# Patient Record
Sex: Male | Born: 1985
Health system: Southern US, Community
[De-identification: ages and names within clinical notes are randomized; demographics above are authoritative.]

## PROBLEM LIST (undated history)

## (undated) DIAGNOSIS — Z8781 Personal history of (healed) traumatic fracture: Secondary | ICD-10-CM

## (undated) DIAGNOSIS — I619 Nontraumatic intracerebral hemorrhage, unspecified: Secondary | ICD-10-CM

## (undated) DIAGNOSIS — I10 Essential (primary) hypertension: Secondary | ICD-10-CM

## (undated) HISTORY — DX: Nontraumatic intracerebral hemorrhage, unspecified: I61.9

## (undated) HISTORY — PX: EYE SURGERY: SHX253

## (undated) HISTORY — PX: VENTRICULOPERITONEAL SHUNT: SHX204

## (undated) HISTORY — PX: PEG TUBE PLACEMENT: SUR1034

## (undated) HISTORY — PX: HEMATOMA EVACUATION: SHX5118

---
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
## 2010-05-15 ENCOUNTER — Emergency Department (HOSPITAL_COMMUNITY): Admission: EM | Admit: 2010-05-15 | Discharge: 2010-05-15 | Payer: Self-pay | Admitting: Emergency Medicine
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
## 2011-01-07 NOTE — Op Note (Signed)
NAME:  Paul [name], Paul [name]                         ACCOUNT NO.:  [id number]   MEDICAL RECORD NO.:  [id number]                   PATIENT TYPE:  OIB   LOCATION:  2899                                 FACILITY:  [facility]   PHYSICIAN:  Quita Skye. Hart Rochester, M.D.               DATE OF BIRTH:  1986-02-06   DATE OF PROCEDURE:  11/13/2002  DATE OF DISCHARGE:  11/13/2002                                 OPERATIVE REPORT   PREOPERATIVE DIAGNOSIS:  Rule out renovascular hypertension secondary to  left renal artery stenosis.   CLINICAL HISTORY:  This young patient has been having worsening hypertension  over the past year, difficult to control medically.  A duplex scan done at  [facility] and Vascular revealed a greater than 80% left renal  artery stenosis.  He is now scheduled for abdominal aortogram to rule out  renal artery stenosis as the case for his renovascular hypertension.   DESCRIPTION OF PROCEDURE:  The patient was taken to the [facility] peripheral endovascular lab and placed in the supine  position, at which time the right groin was prepped with Betadine solution  and draped in a routine sterile manner.  After infiltration with 1%  Xylocaine, the right common femoral artery was entered percutaneously.  A  guide wire was passed into the suprarenal aorta under fluoroscopic guidance.  A 5 French sheath and dilator were passed over the dilator.  The dilator was  removed.  A standard pigtail catheter was positioned in the suprarenal  aorta.  Flush abdominal aortogram was performed injected 20 mL of contrast  at 20 mL/sec.  This revealed the aorta to be widely patent with no evidence  of stenosis.  There were single normal-appearing, widely patent renal  arteries bilaterally originating at the same level.  The superior mesenteric  artery filled rapidly, which was the level of the catheter.  The aorta was  normal in appearance, the inferior mesenteric artery was patent,  and both  common iliac arteries also appeared normal.  Both RAO and LAO projections  were performed to rule out ostial stenoses, injecting 20 mL at 20 mL/sec,  and both confirmed the fact that both renal arteries were single and widely  patent.  Having tolerated the procedure well, the catheter was removed over  the guide wire.  The sheath was removed.  Adequate compression applied.  No  complications ensued.   FINDINGS:  1. Normal-appearing infrarenal aorta with widely patent common iliac     arteries.  2. Single widely patent renal arteries bilaterally and widely patent     inferior mesenteric artery.                                               Quita Skye Hart Rochester, M.D.  JDL/MEDQ  D:  11/13/2002  T:  11/13/2002  Job:  [id number]   cc:   Renaye Rakers, M.D.  [phone] [address]  [place]  [place] [address]  Fax: [phone]

## 2011-01-07 NOTE — Op Note (Signed)
[place]. [facility]  Patient:    Paul [name], Paul [name] Visit Number: [id number] MRN: [id number]          Service Type: DSU Location: RCRM 2550 09 Attending Physician:  Ernesto Rutherford Dictated by:   Ernesto Rutherford, M.D. Proc. Date: 02/04/02 Admit Date:  02/04/2002 Discharge Date: 02/04/2002   CC:         Aura Camps, M.D.   Operative Report  PREOPERATIVE DIAGNOSIS: 1. Dense vitreous hemorrhage of the right eye. 2. 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  POSTOPERATIVE DIAGNOSIS: 1. Dense vitreous hemorrhage of the right eye. 2. 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 3. Epiretinal membrane, left eye, found only after removal of the premacular,    vitreous and subinternal limiting membrane hemorrhage.  OPERATION PERFORMED:  Posterior vitrectomy and membrane peel of the right eye with removal of internal limiting membrane.  SURGEON:  Ernesto Rutherford, M.D.  ANESTHESIA:  General endotracheal.  INDICATIONS FOR PROCEDURE:  The patient is a 25 year old male who has suffered bilateral vitreous hemorrhage who required previous vitrectomy and removal of dense vitreous hemorrhage in the left eye with excellent visual acuity restoration from a legally blind status.  His legally blind right eye is now undergoing vitrectomy to remove the vitreous opacifications as well as to uncover and explore the macular findings.  The patient and family understand the risks of anesthesia including the rare occurrence of death but also to the eye including hemorrhage, infection, scarring, need for another surgery, no change in vision, loss of vision, and progressive disease despite intervention.  DESCRIPTION OF PROCEDURE:  After appropriate signed consent was obtained, the patient was taken to the operating room.  In the operating room general endotracheal anesthesia was instituted without difficulty.  The right periocular region was  sterilely prepped and draped in the usual ophthalmic fashion.   lid speculum was applied.  Conjunctival peritomy fashioned temporally and supranasally.  A 4 mm infusion was secured 4 mm posterior to the limbus in the infratemporal quadrant. Placement in the vitreous cavity was verified visually.  Superior sclerotomy was then fashioned.  Wild microscope was placed in position with Biom attached.  Core vitrectomy was then begun. Iatrogenic posterior vitreous detachment was necessary ____________ suction nasal to the optic nerve.  This was carried out without difficulty and the hila was removed off the macular region.  Dense yellow brown old vitreous hemorrhage hovering over the macular region was connected to an internal limiting membrane and subretinal limiting membrane hemorrhage.  ____________ elevated anterior to the equator at 360 degrees and the skirt trimmed.  No holes were identified.  At this time, the edges of the  internal limiting membrane were then grasped.  They had always been previously elevated presumably from the previously loculated premacular hemorrhage.  Removal of the internal limiting membrane was then carried out without difficulty. Preretinal blood was aspirated.  There was no macular hole.  The optic nerve was intact and of normal appearance.  At this time the instruments were removed from the eye.  The sclerotomy was closed with 7-0 Vicryl suture.  The infusion removed and similarly closed with 7-0 Vicryl suture. Conjunctiva closed with 7-0 Vicryl suture.  Subconjunctival injections of antibiotic and steroid were applied.  The patient was awakened from anesthesia and taken to recovery room in good and stable condition. Dictated by:   Ernesto Rutherford, M.D. Attending Physician:  Ernesto Rutherford DD:  02/04/02 TD:  02/05/02 Job: [phone]  [id number]

## 2011-01-07 NOTE — Op Note (Signed)
[place]. [facility]  Patient:    Paul [name], Paul [name] Visit Number: [id number] MRN: [id number]          Service Type: DSU Location: RCRM 2550 16 Attending Physician:  Ernesto Rutherford Dictated by:   Ernesto Rutherford, M.D. Proc. Date: 01/07/02 Admit Date:  01/07/2002 Discharge Date: 01/07/2002                             Operative Report  PREOPERATIVE DIAGNOSES: 1. Dense vitreous hemorrhage of the left eye. 2. Tersons syndrome, left eye.  POSTOPERATIVE DIAGNOSES: 1. Dense vitreous hemorrhage of the left eye. 2. Tersons syndrome, left eye. 3. Old sub internal limiting membrane hemorrhage of the macular region    of the left eye. 4. Possible macular hole or macular etiology for the sub internal limiting    membrane hemorrhage.  OPERATION: 1. Vitrectomy with membrane peel, internal limiting membrane, left eye. 2. Evacuation of preretinal hemorrhage.  SURGEON:  Ernesto Rutherford, M.D.  ANESTHESIA:  General endotracheal anesthesia.  INDICATIONS FOR PROCEDURE:  The patient is a 25 year old man with profound vision loss in both eyes on the basis of vitreous opacification and vitreous hemorrhage in the left eye.  The patient understands this is attempt to remove the vitreous opacification from his recent traumatic raised intracranial pressure etiology.  The patient and family understand this risks of anesthesia including risk to the eye including hemorrhage, infection, scarring, need for further surgery, no change in vision, loss of vision, vision loss from detachment and need for further surgery.   After appropriate signed consent was obtained, the patient was taken to the operating room.  DESCRIPTION OF PROCEDURE:  In the operating room, general endotracheal anesthesia was instituted without difficulty.  The left periocular region was sterilely prepped and draped in the usual fashion.  Lid speculum applied. Conjunctiva peritomy fashioned temporally and  supranasally.  A 4 mm exposure to the limbus in the inferotemporal quadrant.   Placement in vitreous cavity was verified visually.  Superior sclerotomy was then fashioned. Operating microscope was placed in position with BIOM attached. Core vitrectomy was then begun.  Isogenic posterior vitreous detachment was necessary and this was removed without difficulty overlying the macular region.  Dark copper-colored, old blood from the preretinal hemorrhage was found.  No complication noted.  The vitreous was elevated 360 degrees.  This was trimmed.  There were notable findings of internal limiting membrane and sub internal limiting membrane hemorrhage.  This was massively elevated off the macular region.  There was central dehiscence of the internal limiting membrane which allowed for easy grasping of the peripheral edges of the internal limiting membrane approximately 1-1/2 to 2 disk diameter from the center of the fovea and its removal so as to prevent later topographic distortion.  Preretinal hemorrhage was aspirated without difficulty.  There appeared to be a possibility of a macular cyst or a hole, although no full-thickness hole was appreciated, and decision was made not to manipulate the macular hole. At this time, all instruments were removed from the eye. The peripheral retina was inspected and found to be free of retinal holes or tears.  The a;jlkfsd closed with a Vicryl suture.  The infusion jkl;asdf closed with Vicryl suture.  No complications occurred.  Subconjunctival injection with antibiotics applied.  The patient was awakened from anesthesia without difficulty and taken to the recovery room in good and stable condition.  conjunctiva, frontotemporal, and supranasal.  Rectus  muscles isolated on 2-0 silk ties.  At this time, indirect ophthalmoscopy was then performed.  Given the nature of the underlying choroidal coloration, it was very difficult to see the definitive retinal tear  or break that caused this process.  For this reason, retinal cryopexy was applied at the vitreous base 360 degrees using direct observation and visualization with the indirect ophthalmoscope.  At this time, a 287 solid silicone explant was placed under each of the rectus muscles and tied temporarily inferiorly.  Thereafter, with the intraocular pressure somewhat elevated, drainage via 26-gauge needle on a 3 cc Hub syringe was then carried out.  Approximately 1.5 cc of subretinal fluid was drained in this fashion.  This allowed for constant tension to be applied to the rectus sutures to assure that intraocular pressures stayed normalized.  At this time, the inferior fiber Mersilene mattress sutures on the buckle were then tied securely to provide for excellent elevation of the scleral buckle.  At this time, the remaining fiber Mersilene mattress sutures were secured superiorly in the superior quadrants and the buckle appropriate tension applied in supranasal quadrant and secured ______  with fiber Mersilene mattress sutures.  Excellent reconstitution of the globe intraocular pressure was confirmed.  Indirect ophthalmoscopy confirmed that the retina was nicely attached 360 degrees on the buckle.  The fluid of the oblique fovea had been drained.  At this time, the fat of the buckle was irrigated with bug juice.  Conjunctiva brought forward and closed at the limbus with interrupted 7-0 Vicryl sutures.  At this time, subjunctival injections were injected of Rocephin and steroid  4 cc of retrobulbar Sensorcaine was applied for postoperative analgesic control and comfort.  At this time, sterile patch and eye shield applied to the right eye. Intraocular pressure was found to be adequate.  The patient was awakened from anesthesia and taken to the recovery room in good and stable condition.       anesthesia was instituted without difficulty.  The periocular region was sterilely prepped.   Sterile drape was then carried out.  Care was taken to avoid direct pressure to the globe or the orbital region.  ______  applied  gently.  Upon this, it was noted that there was uvea prolapse at the level of the limbus centered on approximately the 7 oclock meridian extending from the 6 oclock to the 8 oclock meridian for a radial site of approximately 5 to 6 mm in length.  Clear vitreous as well as tattered iris and uvula remnants were noted.  The uvea was excised because of disability of reinsertion into the globe.  Mechanical vitrectomy with Weck-cel was then carried out to allow for spontaneous prolapse of the vitreous back through the wound.  Interrupted 8-0 nylon sutures were then used to close the wound.  One single 10-0 nylon suture was used on the portion of the inferior cornea which had a shelved incision near the limbus.  This successfully closed the wound.  The anterior chamber had been deepened with viscoelastic to maintain the globe contour.  Prior to this closure, the conjunctiva in a limited peritomy fashion had been opened from approximately the 10 oclock down to the 5 oclock meridian.  Now that the scleral wound had been closed at the limbus, the lateral rectus muscle was isolated on a 2-0 silk tie and excellent visualization of the posterior sclera in this area was obtained.  There was no other site of scleral rupture.  The remainder of the conjunctiva and  the Tenons was free of hemorrhage and thus unlikely to have posterior ocular hemorrhage rupture.  At this time, the conjunctiva was brought forward and closed with interrupted 7-0 Vicryl suture.  Subconjunctival injections of steroid were applied. Sterile patch was applied to the right eye.  The patient was awakened from anesthesia and taken to the recovery room in good and stable condition having tolerated the procedure well without complications. Dictated by:   Ernesto Rutherford, M.D. Attending Physician:  Ernesto Rutherford DD:  01/07/02 TD:  01/09/02 Job: [id number]

## 2013-06-12 ENCOUNTER — Emergency Department (HOSPITAL_COMMUNITY)
Admission: EM | Admit: 2013-06-12 | Discharge: 2013-06-12 | Disposition: A | Discharge status: Home / Self Care | Payer: No Typology Code available for payment source | Source: Home / Self Care | Attending: Emergency Medicine | Admitting: Emergency Medicine

## 2013-06-12 ENCOUNTER — Encounter (HOSPITAL_COMMUNITY): Payer: Self-pay | Admitting: Emergency Medicine

## 2013-06-12 DIAGNOSIS — N189 Chronic kidney disease, unspecified: Secondary | ICD-10-CM

## 2013-06-12 DIAGNOSIS — I10 Essential (primary) hypertension: Secondary | ICD-10-CM

## 2013-06-12 HISTORY — DX: Essential (primary) hypertension: I10

## 2013-06-12 LAB — POCT I-STAT, CHEM 8
BUN: 16 mg/dL (ref 6–23)
Calcium, Ion: 1.21 mmol/L (ref 1.12–1.23)
Chloride: 106 meq/L (ref 96–112)
Creatinine, Ser: 1.5 mg/dL — ABNORMAL HIGH (ref 0.50–1.35)
Glucose, Bld: 90 mg/dL (ref 70–99)
HCT: 63 % — ABNORMAL HIGH (ref 39.0–52.0)
Hemoglobin: 21.4 g/dL (ref 13.0–17.0)
Potassium: 3.8 meq/L (ref 3.5–5.1)
Sodium: 143 meq/L (ref 135–145)
TCO2: 25 mmol/L (ref 0–100)

## 2013-06-12 MED ORDER — AMLODIPINE BESYLATE 10 MG PO TABS: 10.0000 mg | ORAL_TABLET | Freq: Every day | ORAL | Status: DC

## 2013-06-12 MED ORDER — CLONIDINE HCL 0.1 MG PO TABS
0.1000 mg | ORAL_TABLET | Freq: Once | ORAL | Status: AC
  Administered 2013-06-12: 0.1 mg via ORAL

## 2013-06-12 MED ORDER — CLONIDINE HCL 0.1 MG PO TABS
ORAL_TABLET | ORAL | Status: AC
  Filled 2013-06-12: qty 1

## 2013-06-12 MED ORDER — CLONIDINE HCL 0.1 MG PO TABS
0.2000 mg | ORAL_TABLET | Freq: Once | ORAL | Status: AC
  Administered 2013-06-12: 0.2 mg via ORAL

## 2013-06-12 MED ORDER — FUROSEMIDE 40 MG PO TABS
ORAL_TABLET | ORAL | Status: AC
  Filled 2013-06-12: qty 1

## 2013-06-12 MED ORDER — VALSARTAN-HYDROCHLOROTHIAZIDE 320-25 MG PO TABS: 1.0000 | ORAL_TABLET | Freq: Every day | ORAL | Status: DC

## 2013-06-12 MED ORDER — FUROSEMIDE 40 MG PO TABS
40.0000 mg | ORAL_TABLET | Freq: Once | ORAL | Status: AC
  Administered 2013-06-12: 40 mg via ORAL

## 2013-06-12 MED ORDER — CLONIDINE HCL 0.1 MG PO TABS
ORAL_TABLET | ORAL | Status: AC
  Filled 2013-06-12: qty 2

## 2013-06-12 NOTE — ED Notes (Signed)
Pt c/o HTN... Went for a physical at his job and BP was high Today BP is 211/138 Denies: HA, SOB, CP, blurry vision, weakness Has not had his meds in >1 yr (diovan and Norvasc) He is alert and talking in complete sentences w/no signs of acute distress.

## 2013-06-12 NOTE — ED Provider Notes (Signed)
Chief Complaint:   Chief Complaint  Patient presents with  . Hypertension    History of Present Illness:   Paul [name] is a 27 year old male who has had a history of high blood pressure since he was a teenager. He had been on Diovan and Norvasc, but has been off for about 3 years. He went for a job physical about 2 weeks ago and his blood pressure was elevated at 230/140. He denies any symptoms such as headaches, dizziness, blurry vision, shortness of breath, chest pain, tightness, pressure, syncope, ankle edema, or strokelike symptoms. He does not have any history of diabetes, kidney disease, hyperlipidemia, cigarette smoking, or alcohol abuse.  Review of Systems:  Other than noted above, the patient denies any of the following symptoms: Systemic:  No fever, chills, fatigue, weight loss or gain. Respiratory:  No coughing, wheezing, or shortness of breath. Cardiac:  No chest pain, tightness, pressure, palpitations, syncope, or edema. Neuro:  No headache, dizziness, blurred vision, weakness, paresthesias, or strokelike symptoms.  PMFSH:  Past medical history, family history, social history, meds, and allergies were reviewed.  Physical Exam:   Vital signs:  BP 178/115  Pulse 74  Temp(Src) 98.4 F (36.9 C) (Oral)  Resp 20  SpO2 100% Filed Vitals:   06/12/13 1024 06/12/13 1140 After clonidine 0.2 mg  06/12/13 1231 After additional clonidine 0.1 mg plus furosemide 40 mg   BP: 211/138 189/137 178/115  Pulse: 84 78 74  Temp: 98.4 F (36.9 C)    TempSrc: Oral    Resp: 20    SpO2: 100%     General:  Alert, oriented, in no distress. Lungs:  Breath sounds clear and equal bilaterally.  No wheezes, rales, or rhonchi. Heart:  Regular rhythm, no gallops, murmers, clicks or rubs.  Abdomen:  Soft and flat.  Nontender, no organomegaly or mass.  No pulsatile midline abdominal mass or bruit. Ext:  No edema, pulses full. Neurological exam:  Alert and oriented.  Speech is clear.  No pronator  drift.  CNs intact.  Labs:   Results for orders placed during the hospital encounter of 06/12/13  POCT I-STAT, CHEM 8      Result Value Range   Sodium 143  135 - 145 mEq/L   Potassium 3.8  3.5 - 5.1 mEq/L   Chloride 106  96 - 112 mEq/L   BUN 16  6 - 23 mg/dL   Creatinine, Ser 1.61 (*) 0.50 - 1.35 mg/dL   Glucose, Bld 90  70 - 99 mg/dL   Calcium, Ion 0.96  0.45 - 1.23 mmol/L   TCO2 25  0 - 100 mmol/L   Hemoglobin 21.4 (*) 13.0 - 17.0 g/dL   HCT 40.9 (*) 81.1 - 91.4 %   Comment NOTIFIED PHYSICIAN       Course in Urgent Care Center:   He was given clonidine 0.2 mg by mouth and his blood pressure came down as indicated above somewhat, however it was still very high. He was given additional clonidine 0.1 mg dosage and 40 mg of furosemide and his blood pressure came down a little bit more.  Assessment:  The primary encounter diagnosis was Uncontrolled hypertension. A diagnosis of Chronic kidney disease was also pertinent to this visit.  He'll need followup with his primary care physician within the next week. I also discussed sodium and salt restriction, weight loss, and the D.A.S.H. diet.  Plan:   1.  Meds:  The following meds were prescribed:   Discharge Medication  List as of 06/12/2013 12:33 PM    START taking these medications   Details  amLODipine (NORVASC) 10 MG tablet Take 1 tablet (10 mg total) by mouth daily., Starting 06/12/2013, Until Discontinued, Normal    valsartan-hydrochlorothiazide (DIOVAN HCT) 320-25 MG per tablet Take 1 tablet by mouth daily., Starting 06/12/2013, Until Discontinued, Normal        2.  Patient Education/Counseling:  The patient was given appropriate handouts, self care instructions, and instructed in symptomatic relief. Specifically discussed salt and sodium restriction, weight control, and exercise.  3.  Follow up:  The patient was told to follow up if no better in 3 to 4 days, if becoming worse in any way, and given some red flag symptoms such as  headache, blurry vision, chest pain, shortness of breath, or strokelike symptoms which would prompt immediate return.  Follow up with his primary care physician, Dr. Andi Devon within the next week.     Reuben Likes, MD 06/12/13 1300

## 2014-04-03 ENCOUNTER — Encounter (HOSPITAL_COMMUNITY): Payer: Self-pay | Admitting: Emergency Medicine

## 2014-04-03 DIAGNOSIS — Z982 Presence of cerebrospinal fluid drainage device: Secondary | ICD-10-CM

## 2014-04-03 DIAGNOSIS — G510 Bell's palsy: Principal | ICD-10-CM | POA: Diagnosis present

## 2014-04-03 DIAGNOSIS — E785 Hyperlipidemia, unspecified: Secondary | ICD-10-CM | POA: Diagnosis present

## 2014-04-03 DIAGNOSIS — Z91199 Patient's noncompliance with other medical treatment and regimen due to unspecified reason: Secondary | ICD-10-CM

## 2014-04-03 DIAGNOSIS — I10 Essential (primary) hypertension: Secondary | ICD-10-CM | POA: Diagnosis present

## 2014-04-03 DIAGNOSIS — R29810 Facial weakness: Secondary | ICD-10-CM | POA: Diagnosis not present

## 2014-04-03 DIAGNOSIS — R791 Abnormal coagulation profile: Secondary | ICD-10-CM | POA: Diagnosis present

## 2014-04-03 DIAGNOSIS — Z9119 Patient's noncompliance with other medical treatment and regimen: Secondary | ICD-10-CM

## 2014-04-03 DIAGNOSIS — Z6841 Body Mass Index (BMI) 40.0 and over, adult: Secondary | ICD-10-CM

## 2014-04-03 DIAGNOSIS — E669 Obesity, unspecified: Secondary | ICD-10-CM | POA: Diagnosis present

## 2014-04-03 NOTE — ED Notes (Addendum)
Presents with "feeling off balance when I walk and fatigued" began this AM at 8 AM. Pt is hypertensive 238/148-alert, oriented, left side of face with droop-pt reports that is normal for him from an acccident he was in-denies numbness and tingling. No arm drift, grips equal bilaterally, no slurred speech or confusion. Denies blurred vision and headache/ endorses generalized fatigue

## 2014-04-04 ENCOUNTER — Emergency Department (HOSPITAL_COMMUNITY): Payer: No Typology Code available for payment source

## 2014-04-04 ENCOUNTER — Encounter (HOSPITAL_COMMUNITY): Payer: Self-pay

## 2014-04-04 ENCOUNTER — Inpatient Hospital Stay (HOSPITAL_COMMUNITY)
Admission: EM | Admit: 2014-04-04 | Discharge: 2014-04-06 | DRG: 074 | Disposition: A | Discharge status: Home / Self Care | Payer: No Typology Code available for payment source | Attending: Internal Medicine | Admitting: Internal Medicine

## 2014-04-04 DIAGNOSIS — I10 Essential (primary) hypertension: Secondary | ICD-10-CM

## 2014-04-04 DIAGNOSIS — I160 Hypertensive urgency: Secondary | ICD-10-CM

## 2014-04-04 DIAGNOSIS — R42 Dizziness and giddiness: Secondary | ICD-10-CM

## 2014-04-04 DIAGNOSIS — G510 Bell's palsy: Secondary | ICD-10-CM | POA: Diagnosis present

## 2014-04-04 DIAGNOSIS — I161 Hypertensive emergency: Secondary | ICD-10-CM | POA: Diagnosis present

## 2014-04-04 DIAGNOSIS — R9431 Abnormal electrocardiogram [ECG] [EKG]: Secondary | ICD-10-CM

## 2014-04-04 LAB — CBC
HEMATOCRIT: 51 % (ref 39.0–52.0)
Hemoglobin: 17.4 g/dL — ABNORMAL HIGH (ref 13.0–17.0)
MCH: 26.7 pg (ref 26.0–34.0)
MCHC: 34.1 g/dL (ref 30.0–36.0)
MCV: 78.3 fL (ref 78.0–100.0)
PLATELETS: 268 10*3/uL (ref 150–400)
RBC: 6.51 MIL/uL — ABNORMAL HIGH (ref 4.22–5.81)
RDW: 12.9 % (ref 11.5–15.5)
WBC: 7.2 10*3/uL (ref 4.0–10.5)

## 2014-04-04 LAB — DIFFERENTIAL
BASOS ABS: 0 10*3/uL (ref 0.0–0.1)
BASOS PCT: 0 % (ref 0–1)
EOS PCT: 1 % (ref 0–5)
Eosinophils Absolute: 0.1 10*3/uL (ref 0.0–0.7)
LYMPHS PCT: 29 % (ref 12–46)
Lymphs Abs: 2.1 10*3/uL (ref 0.7–4.0)
MONO ABS: 0.5 10*3/uL (ref 0.1–1.0)
Monocytes Relative: 7 % (ref 3–12)
Neutro Abs: 4.5 10*3/uL (ref 1.7–7.7)
Neutrophils Relative %: 63 % (ref 43–77)

## 2014-04-04 LAB — ETHANOL

## 2014-04-04 LAB — COMPREHENSIVE METABOLIC PANEL
ALT: 22 U/L (ref 0–53)
AST: 17 U/L (ref 0–37)
Albumin: 4 g/dL (ref 3.5–5.2)
Alkaline Phosphatase: 74 U/L (ref 39–117)
Anion gap: 15 (ref 5–15)
BUN: 11 mg/dL (ref 6–23)
CALCIUM: 9.4 mg/dL (ref 8.4–10.5)
CO2: 24 meq/L (ref 19–32)
Chloride: 102 mEq/L (ref 96–112)
Creatinine, Ser: 0.95 mg/dL (ref 0.50–1.35)
GFR calc Af Amer: >90 mL/min (ref >90)
Glucose, Bld: 85 mg/dL (ref 70–99)
Potassium: 4 mEq/L (ref 3.7–5.3)
SODIUM: 141 meq/L (ref 137–147)
Total Bilirubin: 0.4 mg/dL (ref 0.3–1.2)
Total Protein: 7.5 g/dL (ref 6.0–8.3)

## 2014-04-04 LAB — RAPID URINE DRUG SCREEN, HOSP PERFORMED
Amphetamines: NOT DETECTED
BARBITURATES: NOT DETECTED
BENZODIAZEPINES: NOT DETECTED
Cocaine: NOT DETECTED
Opiates: NOT DETECTED
Tetrahydrocannabinol: NOT DETECTED

## 2014-04-04 LAB — URINALYSIS, ROUTINE W REFLEX MICROSCOPIC
Bilirubin Urine: NEGATIVE
GLUCOSE, UA: NEGATIVE mg/dL
Hgb urine dipstick: NEGATIVE
KETONES UR: NEGATIVE mg/dL
LEUKOCYTES UA: NEGATIVE
NITRITE: NEGATIVE
PH: 6 (ref 5.0–8.0)
Protein, ur: NEGATIVE mg/dL
Specific Gravity, Urine: 1.016 (ref 1.005–1.030)
Urobilinogen, UA: 0.2 mg/dL (ref 0.0–1.0)

## 2014-04-04 LAB — MRSA PCR SCREENING: MRSA by PCR: NEGATIVE

## 2014-04-04 LAB — PROTIME-INR
INR: 2.22 — AB (ref 0.00–1.49)
Prothrombin Time: 24.6 seconds — ABNORMAL HIGH (ref 11.6–15.2)

## 2014-04-04 LAB — GLUCOSE, CAPILLARY
GLUCOSE-CAPILLARY: 156 mg/dL — AB (ref 70–99)
GLUCOSE-CAPILLARY: 148 mg/dL — AB (ref 70–99)

## 2014-04-04 LAB — I-STAT TROPONIN, ED: TROPONIN I, POC: 0 ng/mL (ref 0.00–0.08)

## 2014-04-04 LAB — HEMOGLOBIN A1C
Hgb A1c MFr Bld: 5.5 % (ref <5.7)
Mean Plasma Glucose: 111 mg/dL (ref <117)

## 2014-04-04 LAB — APTT: aPTT: 32 seconds (ref 24–37)

## 2014-04-04 IMAGING — CT CT HEAD W/O CM
1 series · 15 of 30 positions shown, 19 images · non-contrast
Comparison: None.

CLINICAL DATA: Left facial droop.  Severe hypertension.

EXAM:
CT HEAD WITHOUT CONTRAST
TECHNIQUE: Contiguous axial images were obtained from the base of the skull
through the vertex without intravenous contrast.

[Series 2: head 5.0 h30s · axial · 0.48mm/px · z∈[-222,-87]mm · 15 of 31 slices shown, 19 images]
[im 2/31  brain]
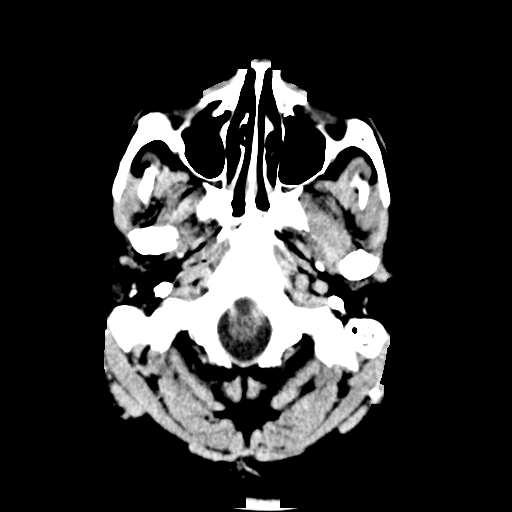
[im 2/31  bone]
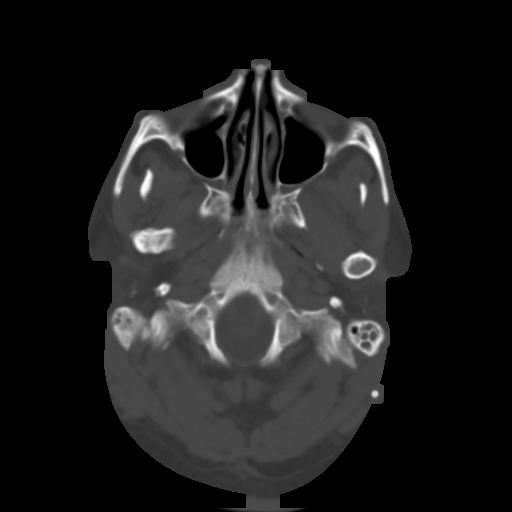
[im 4/31  brain]
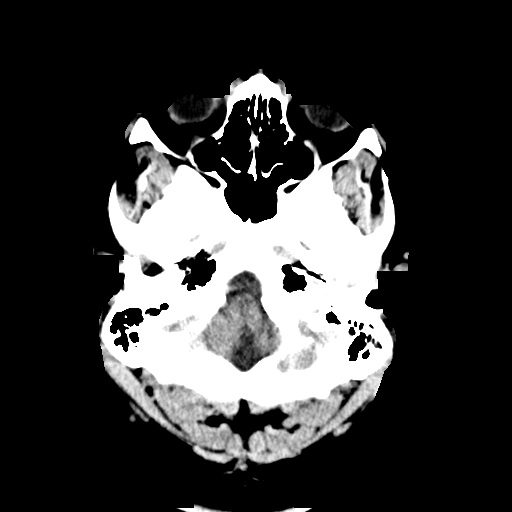
[im 6/31  brain]
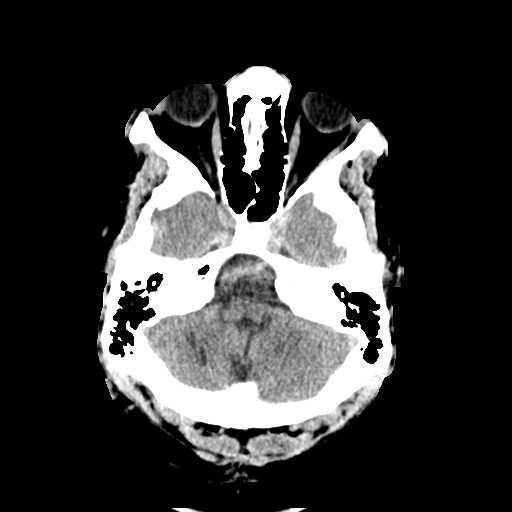
[im 8/31  brain]
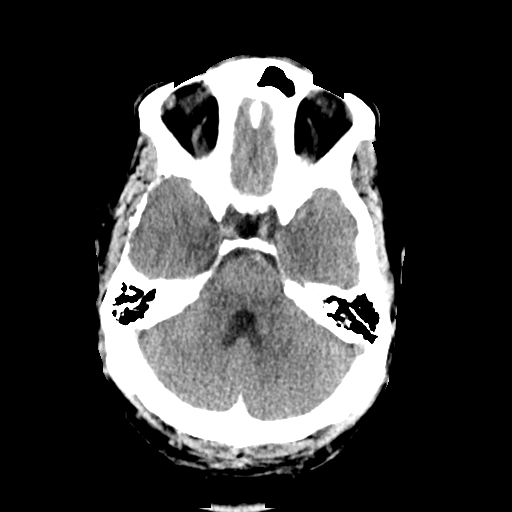
[im 10/31  brain]
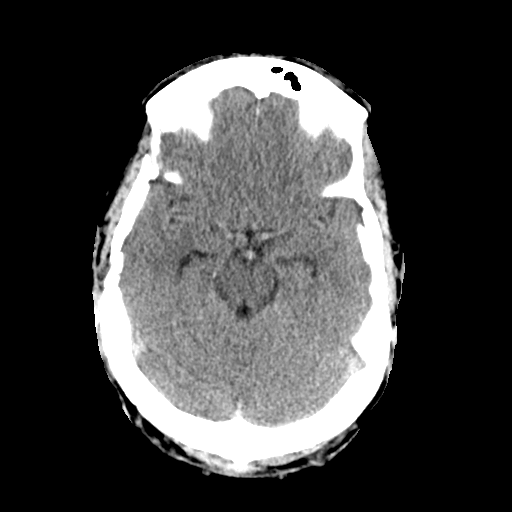
[im 10/31  bone]
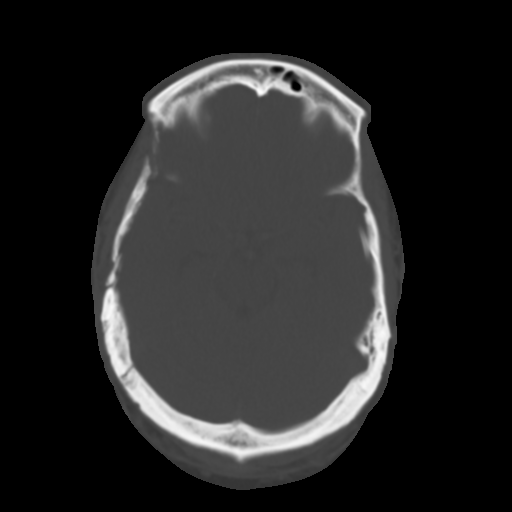
[im 12/31  brain]
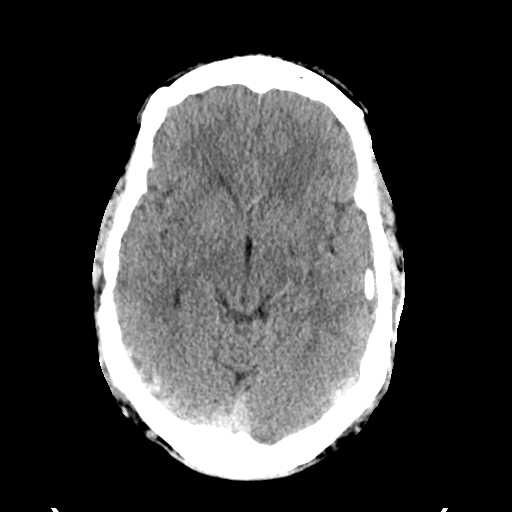
[im 14/31  brain]
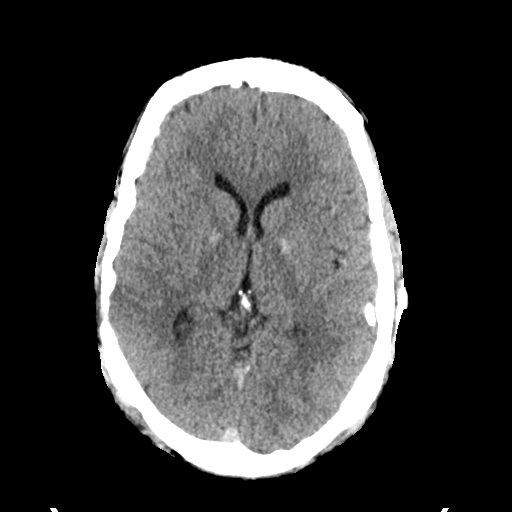
[im 16/31  brain]
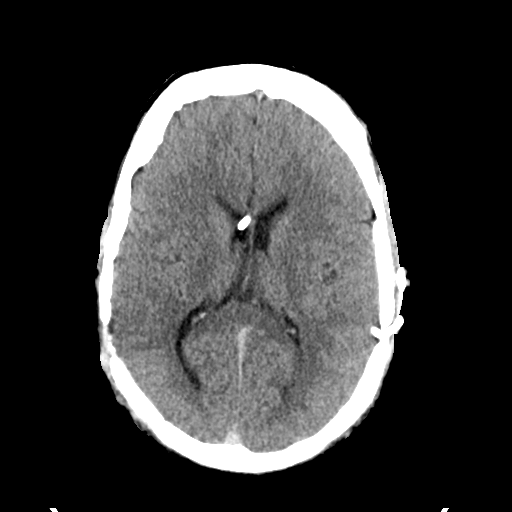
[im 17/31  brain]
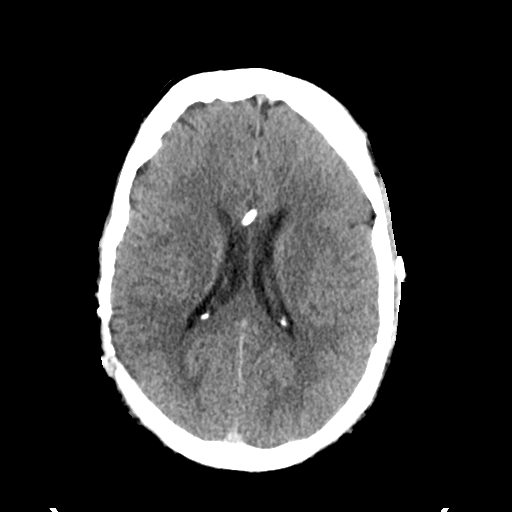
[im 17/31  bone]
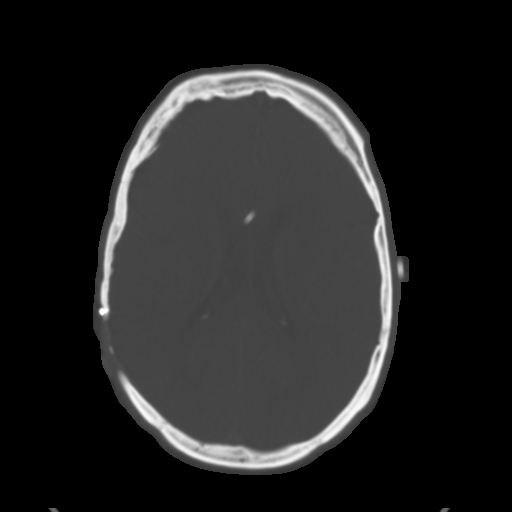
[im 19/31  brain]
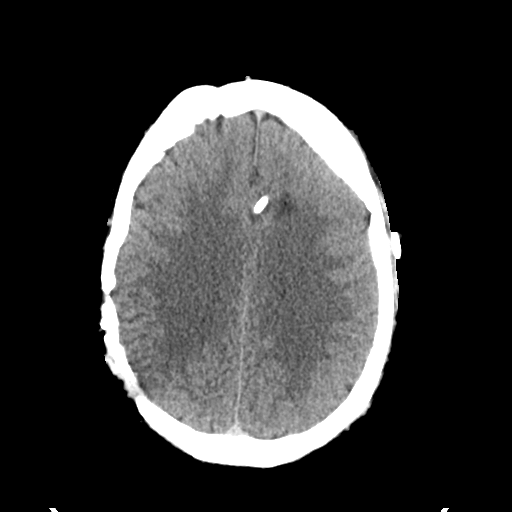
[im 21/31  brain]
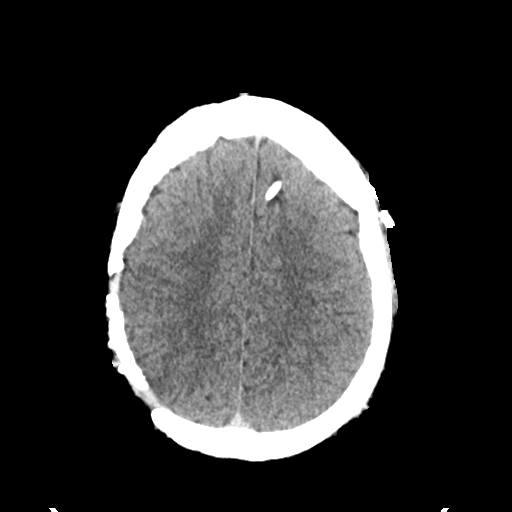
[im 23/31  brain]
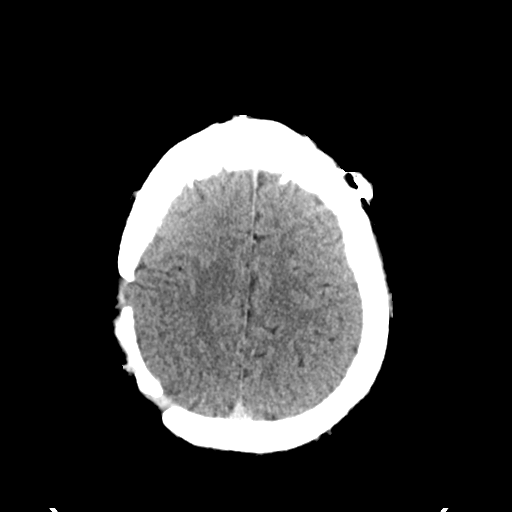
[im 25/31  brain]
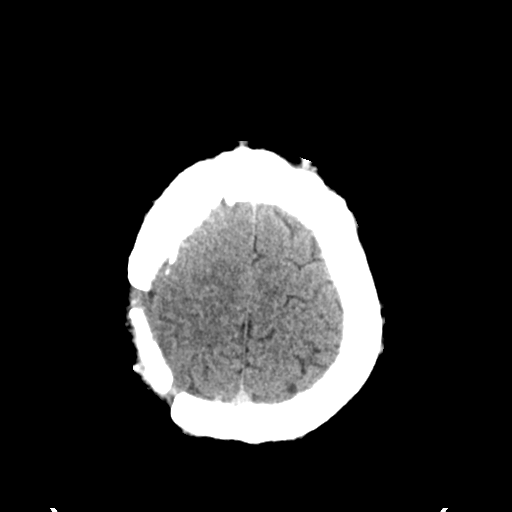
[im 25/31  bone]
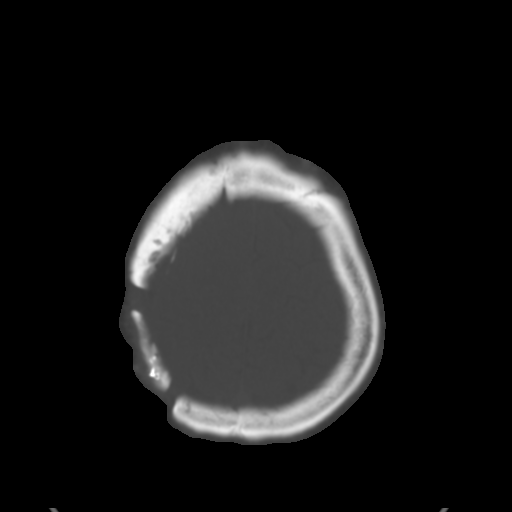
[im 27/31  brain]
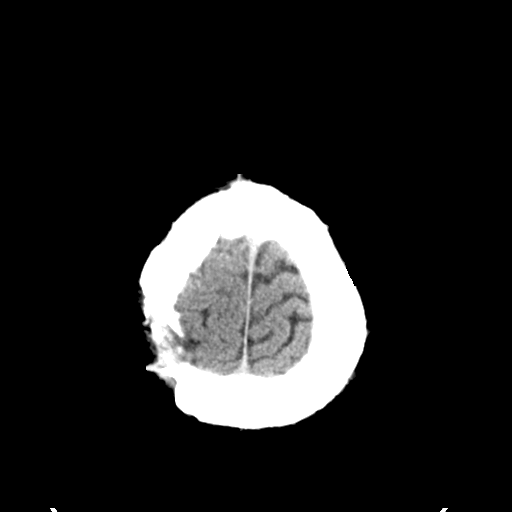
[im 29/31  brain]
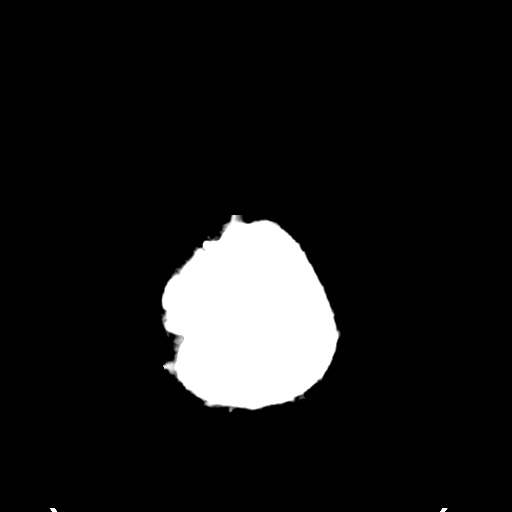

[15 of 30 positions shown; findings below may reference images not displayed]

FINDINGS: There is no evidence of acute infarction, mass lesion, or intra- or
extra-axial hemorrhage on CT.

A left frontal ventricular shunt is noted ending at the frontal horn
of the right lateral ventricle. It extends inferiorly along the left
side of the head to the inferior aspect of the left temporal lobe,
along the base of the skull. There is no evidence of hydrocephalus
at this time.

Mild postoperative change is noted at the right parietal lobe, with
an overlying craniotomy flap and defect. Calcification is seen at
the basal ganglia bilaterally.

The posterior fossa, including the cerebellum, brainstem and fourth
ventricle, is within normal limits. The cerebral hemispheres
demonstrate grossly normal gray-white differentiation. No mass
effect or midline shift is seen.

There is no evidence of fracture; there is apparent chronic anterior
dislocation at the right temporomandibular joint, with associated
chronic deformity of the right mandibular condyle. The orbits are
within normal limits. The paranasal sinuses and mastoid air cells
are well-aerated. No significant soft tissue abnormalities are seen.
IMPRESSION: 1. No acute intracranial pathology seen on CT.
2. Ventricular shunt appears grossly intact. No evidence of
hydrocephalus at this time.
3. Mild postoperative change at the right parietal lobe.
4. Apparent chronic anterior dislocation at the right
temporomandibular joint, with associated chronic deformity of the
right mandibular condyle.

## 2014-04-04 MED ORDER — LABETALOL HCL 5 MG/ML IV SOLN
10.0000 mg | INTRAVENOUS | Status: DC | PRN
  Filled 2014-04-04: qty 4

## 2014-04-04 MED ORDER — METOPROLOL TARTRATE 25 MG PO TABS
25.0000 mg | ORAL_TABLET | Freq: Once | ORAL | Status: AC
  Administered 2014-04-04: 25 mg via ORAL
  Filled 2014-04-04: qty 1

## 2014-04-04 MED ORDER — LISINOPRIL 20 MG PO TABS
20.0000 mg | ORAL_TABLET | Freq: Every day | ORAL | Status: DC
  Administered 2014-04-04 – 2014-04-05 (×2): 20 mg via ORAL
  Filled 2014-04-04 (×2): qty 1

## 2014-04-04 MED ORDER — ENOXAPARIN SODIUM 40 MG/0.4ML ~~LOC~~ SOLN
40.0000 mg | SUBCUTANEOUS | Status: DC
  Administered 2014-04-05: 40 mg via SUBCUTANEOUS
  Filled 2014-04-04 (×3): qty 0.4

## 2014-04-04 MED ORDER — LABETALOL HCL 5 MG/ML IV SOLN
20.0000 mg | Freq: Once | INTRAVENOUS | Status: AC
  Administered 2014-04-04: 20 mg via INTRAVENOUS
  Filled 2014-04-04: qty 4

## 2014-04-04 MED ORDER — SODIUM CHLORIDE 0.9 % IV SOLN: INTRAVENOUS | Status: DC

## 2014-04-04 MED ORDER — IRBESARTAN 150 MG PO TABS
150.0000 mg | ORAL_TABLET | Freq: Every day | ORAL | Status: DC
  Administered 2014-04-04: 150 mg via ORAL
  Filled 2014-04-04: qty 1

## 2014-04-04 MED ORDER — AMLODIPINE BESYLATE 5 MG PO TABS
5.0000 mg | ORAL_TABLET | Freq: Every day | ORAL | Status: DC
  Administered 2014-04-04: 5 mg via ORAL
  Filled 2014-04-04: qty 1

## 2014-04-04 MED ORDER — HYDRALAZINE HCL 20 MG/ML IJ SOLN
INTRAMUSCULAR | Status: AC
  Administered 2014-04-04: 10 mg via INTRAVENOUS
  Filled 2014-04-04: qty 1

## 2014-04-04 MED ORDER — VALACYCLOVIR HCL 500 MG PO TABS
1000.0000 mg | ORAL_TABLET | Freq: Three times a day (TID) | ORAL | Status: DC
  Administered 2014-04-04 – 2014-04-06 (×6): 1000 mg via ORAL
  Filled 2014-04-04 (×8): qty 2

## 2014-04-04 MED ORDER — VALACYCLOVIR HCL 500 MG PO TABS
1000.0000 mg | ORAL_TABLET | Freq: Three times a day (TID) | ORAL | Status: DC
  Administered 2014-04-04: 1000 mg via ORAL
  Filled 2014-04-04 (×4): qty 2

## 2014-04-04 MED ORDER — LABETALOL HCL 5 MG/ML IV SOLN
80.0000 mg | INTRAVENOUS | Status: DC | PRN
  Administered 2014-04-04: 80 mg via INTRAVENOUS
  Filled 2014-04-04: qty 16

## 2014-04-04 MED ORDER — ACETAMINOPHEN 325 MG PO TABS: 650.0000 mg | ORAL_TABLET | ORAL | Status: DC | PRN

## 2014-04-04 MED ORDER — HYDRALAZINE HCL 20 MG/ML IJ SOLN
10.0000 mg | Freq: Four times a day (QID) | INTRAMUSCULAR | Status: DC | PRN
  Administered 2014-04-04: 10 mg via INTRAVENOUS
  Administered 2014-04-05: 16:00:00 via INTRAVENOUS
  Filled 2014-04-04: qty 1

## 2014-04-04 MED ORDER — METHYLPREDNISOLONE SODIUM SUCC 125 MG IJ SOLR
125.0000 mg | Freq: Once | INTRAMUSCULAR | Status: AC
  Administered 2014-04-04: 125 mg via INTRAVENOUS
  Filled 2014-04-04: qty 2

## 2014-04-04 MED ORDER — PREDNISONE 50 MG PO TABS
60.0000 mg | ORAL_TABLET | Freq: Every day | ORAL | Status: DC
  Administered 2014-04-05 – 2014-04-06 (×2): 60 mg via ORAL
  Filled 2014-04-04 (×3): qty 1

## 2014-04-04 MED ORDER — HYDRALAZINE HCL 25 MG PO TABS
25.0000 mg | ORAL_TABLET | Freq: Three times a day (TID) | ORAL | Status: DC
  Administered 2014-04-04 – 2014-04-05 (×3): 25 mg via ORAL
  Filled 2014-04-04 (×6): qty 1

## 2014-04-04 MED ORDER — METOPROLOL TARTRATE 50 MG PO TABS
50.0000 mg | ORAL_TABLET | Freq: Two times a day (BID) | ORAL | Status: DC
  Administered 2014-04-04 – 2014-04-06 (×5): 50 mg via ORAL
  Filled 2014-04-04: qty 1
  Filled 2014-04-04: qty 2
  Filled 2014-04-04 (×5): qty 1

## 2014-04-04 MED ORDER — ALPRAZOLAM 0.25 MG PO TABS: 0.2500 mg | ORAL_TABLET | Freq: Three times a day (TID) | ORAL | Status: DC | PRN

## 2014-04-04 MED ORDER — LABETALOL HCL 5 MG/ML IV SOLN
40.0000 mg | INTRAVENOUS | Status: DC | PRN
  Administered 2014-04-04: 40 mg via INTRAVENOUS
  Filled 2014-04-04: qty 8

## 2014-04-04 NOTE — ED Notes (Signed)
Pt requesting BP medications and discharge. Will ask EDP to consult with patient.

## 2014-04-04 NOTE — Progress Notes (Signed)
Utilization Review Completed.Paul [name] T8/14/2015  

## 2014-04-04 NOTE — Progress Notes (Addendum)
Patient admitted by Dr. K, please seeKirtland [name] H&P.  Patient is refusing MRI- educated and possibility of stroke.  Patient has had high BP for years- has not been taking home BP meds.  Will try to start meds that are on the $4 [employer] list.   Await repeat INR -if BP not improved, may need gtt Marlin CanaryJessica Seraiah Nowack DO
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
## 2014-04-05 DIAGNOSIS — Z982 Presence of cerebrospinal fluid drainage device: Secondary | ICD-10-CM | POA: Diagnosis not present

## 2014-04-05 DIAGNOSIS — E785 Hyperlipidemia, unspecified: Secondary | ICD-10-CM | POA: Diagnosis present

## 2014-04-05 DIAGNOSIS — G510 Bell's palsy: Secondary | ICD-10-CM | POA: Diagnosis present

## 2014-04-05 DIAGNOSIS — I10 Essential (primary) hypertension: Secondary | ICD-10-CM | POA: Diagnosis present

## 2014-04-05 DIAGNOSIS — R791 Abnormal coagulation profile: Secondary | ICD-10-CM | POA: Diagnosis present

## 2014-04-05 DIAGNOSIS — Z9119 Patient's noncompliance with other medical treatment and regimen: Secondary | ICD-10-CM | POA: Diagnosis not present

## 2014-04-05 DIAGNOSIS — E669 Obesity, unspecified: Secondary | ICD-10-CM | POA: Diagnosis present

## 2014-04-05 DIAGNOSIS — R29810 Facial weakness: Secondary | ICD-10-CM | POA: Diagnosis present

## 2014-04-05 DIAGNOSIS — Z91199 Patient's noncompliance with other medical treatment and regimen due to unspecified reason: Secondary | ICD-10-CM | POA: Diagnosis not present

## 2014-04-05 DIAGNOSIS — Z6841 Body Mass Index (BMI) 40.0 and over, adult: Secondary | ICD-10-CM | POA: Diagnosis not present

## 2014-04-05 LAB — HIV ANTIBODY (ROUTINE TESTING W REFLEX): HIV 1&2 Ab, 4th Generation: NONREACTIVE

## 2014-04-05 LAB — GLUCOSE, CAPILLARY
GLUCOSE-CAPILLARY: 105 mg/dL — AB (ref 70–99)
Glucose-Capillary: 110 mg/dL — ABNORMAL HIGH (ref 70–99)
Glucose-Capillary: 108 mg/dL — ABNORMAL HIGH (ref 70–99)
Glucose-Capillary: 125 mg/dL — ABNORMAL HIGH (ref 70–99)

## 2014-04-05 LAB — LIPID PANEL
Cholesterol: 191 mg/dL (ref 0–200)
HDL: 47 mg/dL (ref >39)
LDL Cholesterol: 132 mg/dL — ABNORMAL HIGH (ref 0–99)
Total CHOL/HDL Ratio: 4.1 RATIO
Triglycerides: 58 mg/dL (ref <150)
VLDL: 12 mg/dL (ref 0–40)

## 2014-04-05 LAB — PROTIME-INR
INR: 1.06 (ref 0.00–1.49)
PROTHROMBIN TIME: 13.8 s (ref 11.6–15.2)

## 2014-04-05 MED ORDER — HYDROCHLOROTHIAZIDE 25 MG PO TABS
25.0000 mg | ORAL_TABLET | Freq: Every day | ORAL | Status: DC
  Administered 2014-04-05 – 2014-04-06 (×2): 25 mg via ORAL
  Filled 2014-04-05 (×2): qty 1

## 2014-04-05 MED ORDER — HYDRALAZINE HCL 50 MG PO TABS
50.0000 mg | ORAL_TABLET | Freq: Three times a day (TID) | ORAL | Status: DC
  Administered 2014-04-05 – 2014-04-06 (×3): 50 mg via ORAL
  Filled 2014-04-05 (×6): qty 1

## 2014-04-05 MED ORDER — LISINOPRIL 40 MG PO TABS
40.0000 mg | ORAL_TABLET | Freq: Every day | ORAL | Status: DC
  Administered 2014-04-06: 40 mg via ORAL
  Filled 2014-04-05: qty 1

## 2014-04-05 NOTE — Plan of Care (Signed)
Problem: Phase I Progression Outcomes Goal: OOB as tolerated unless otherwise ordered Outcome: Completed/Met Date Met:  04/05/14 Pt is independent and able to walk around room freely

## 2014-04-05 NOTE — Plan of Care (Signed)
Problem: Phase I Progression Outcomes Goal: Hemodynamically stable Outcome: Progressing Pt blood pressure has been in the 150-160 systolic range and 70-80 in the diastolic range.

## 2014-04-05 NOTE — Progress Notes (Signed)
Utilization Review Completed.Paul [name] T8/15/2015  

## 2014-04-05 NOTE — Discharge Instructions (Signed)
Bell's Palsy °Bell's palsy is a condition in which the muscles on one side of the face cannot move (paralysis). This is because the nerves in the face are paralyzed. It is most often thought to be caused by a virus. The virus causes swelling of the nerve that controls movement on one side of the face. The nerve travels through a tight space surrounded by bone. When the nerve swells, it can be compressed by the bone. This results in damage to the protective covering around the nerve. This damage interferes with how the nerve communicates with the muscles of the face. As a result, it can cause weakness or paralysis of the facial muscles.  °Injury (trauma), tumor, and surgery may cause Bell's palsy, but most of the time the cause is unknown. It is a relatively common condition. It starts suddenly (abrupt onset) with the paralysis usually ending within 2 days. Bell's palsy is not dangerous. But because the eye does not close properly, you may need care to keep the eye from getting dry. This can include splinting (to keep the eye shut) or moistening with artificial tears. Bell's palsy very seldom occurs on both sides of the face at the same time. °SYMPTOMS  °· Eyebrow sagging. °· Drooping of the eyelid and corner of the mouth. °· Inability to close one eye. °· Loss of taste on the front of the tongue. °· Sensitivity to loud noises. °TREATMENT  °The treatment is usually non-surgical. If the patient is seen within the first 24 to 48 hours, a short course of steroids may be prescribed, in an attempt to shorten the length of the condition. Antiviral medicines may also be used with the steroids, but it is unclear if they are helpful.  °You will need to protect your eye, if you cannot close it. The cornea (clear covering over your eye) will become dry and can be damaged. Artificial tears can be used to keep your eye moist. Glasses or an eye patch should be worn to protect your eye. °PROGNOSIS  °Recovery is variable, ranging  from days to months. Although the problem usually goes away completely (about 80% of cases resolve), predicting the outcome is impossible. Most people improve within 3 weeks of when the symptoms began. Improvement may continue for 3 to 6 months. A small number of people have moderate to severe weakness that is permanent.  °HOME CARE INSTRUCTIONS  °· If your caregiver prescribed medication to reduce swelling in the nerve, use as directed. Do not stop taking the medication unless directed by your caregiver. °· Use moisturizing eye drops as needed to prevent drying of your eye, as directed by your caregiver. °· Protect your eye, as directed by your caregiver. °· Use facial massage and exercises, as directed by your caregiver. °· Perform your normal activities, and get your normal rest. °SEEK IMMEDIATE MEDICAL CARE IF:  °· There is pain, redness or irritation in the eye. °· You or your child has an oral temperature above 102° F (38.9° C), not controlled by medicine. °MAKE SURE YOU:  °· Understand these instructions. °· Will watch your condition. °· Will get help right away if you are not doing well or get worse. °Document Released: 08/08/2005 Document Revised: 10/31/2011 Document Reviewed: 11/15/2013 °[employer]® Patient Information ©2015 [employer]. This information is not intended to replace advice given to you by your health care provider. Make sure you discuss any questions you have with your health care provider. ° °

## 2014-04-05 NOTE — Progress Notes (Addendum)
PROGRESS NOTE  Paul [name] NFA:[id number] DOB: 02-Aug-1986 DOA: 04/04/2014 PCP: Alva Garnet., MD  Assessment/Plan: Uncontrolled HTN- has not been taking medications at home- running 190s when he checks, no headache, no chest pain -starting $4 medications from [facility] as costs were preciously a concern - ? If once medications are taken consistently and BP not better, may need RAD and nephrology referral  Bells palsy- given steroids/acyclovir x 1 week Refusing MRI  HLD- diet changes  Dizziness resolved, patient up walking around  Code Status: full Family Communication: patient Disposition Plan:    Consultants:    Procedures:      HPI/Subjective: BP still high No symptoms  Objective: Filed Vitals:   04/05/14 0802  BP: 198/110  Pulse: 100  Temp: 98 F (36.7 C)  Resp: 20    Intake/Output Summary (Last 24 hours) at 04/05/14 1121 Last data filed at 04/05/14 0900  Gross per 24 hour  Intake    600 ml  Output      1 ml  Net    599 ml   Filed Weights   04/04/14 1400  Weight: 154.5 kg (340 lb 9.8 oz)    Exam:   General:  A+OX3, NAD  Cardiovascular: rrr  Respiratory: clear  Abdomen: +BS, soft  Musculoskeletal: moves all 4 ext   Data Reviewed: Basic Metabolic Panel:  Recent Labs Lab 04/04/14 0015  NA 141  K 4.0  CL 102  CO2 24  GLUCOSE 85  BUN 11  CREATININE 0.95  CALCIUM 9.4   Liver Function Tests:  Recent Labs Lab 04/04/14 0015  AST 17  ALT 22  ALKPHOS 74  BILITOT 0.4  PROT 7.5  ALBUMIN 4.0   No results found for this basename: LIPASE, AMYLASE,  in the last 168 hours No results found for this basename: AMMONIA,  in the last 168 hours CBC:  Recent Labs Lab 04/04/14 0015  WBC 7.2  NEUTROABS 4.5  HGB 17.4*  HCT 51.0  MCV 78.3  PLT 268   Cardiac Enzymes: No results found for this basename: CKTOTAL, CKMB, CKMBINDEX, TROPONINI,  in the last 168 hours BNP (last 3 results) No results found for this basename:  PROBNP,  in the last 8760 hours CBG:  Recent Labs Lab 04/04/14 1742 04/04/14 2126 04/05/14 0815  GLUCAP 156* 148* 110*    Recent Results (from the past 240 hour(s))  MRSA PCR SCREENING     Status: None   Collection Time    04/04/14  2:11 PM      Result Value Ref Range Status   MRSA by PCR NEGATIVE  NEGATIVE Final   Comment:            The GeneXpert MRSA Assay (FDA     approved for NASAL specimens     only), is one component of a     comprehensive MRSA colonization     surveillance program. It is not     intended to diagnose MRSA     infection nor to guide or     monitor treatment for     MRSA infections.     Studies: Ct Head Wo Contrast  04/04/2014   CLINICAL DATA:  Left facial droop.  Severe hypertension.  EXAM: CT HEAD WITHOUT CONTRAST  TECHNIQUE: Contiguous axial images were obtained from the base of the skull through the vertex without intravenous contrast.  COMPARISON:  None.  FINDINGS: There is no evidence of acute infarction, mass lesion, or intra- or extra-axial hemorrhage on CT.  A left frontal ventricular shunt is noted ending at the frontal horn of the right lateral ventricle. It extends inferiorly along the left side of the head to the inferior aspect of the left temporal lobe, along the base of the skull. There is no evidence of hydrocephalus at this time.  Mild postoperative change is noted at the right parietal lobe, with an overlying craniotomy flap and defect. Calcification is seen at the basal ganglia bilaterally.  The posterior fossa, including the cerebellum, brainstem and fourth ventricle, is within normal limits. The cerebral hemispheres demonstrate grossly normal gray-white differentiation. No mass effect or midline shift is seen.  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 The orbits are within normal limits. The  paranasal sinuses and mastoid air cells are well-aerated. No significant soft tissue abnormalities are seen.  IMPRESSION: 1. No acute intracranial pathology seen on CT. 2. Ventricular shunt appears grossly intact. No evidence of hydrocephalus at this time. 3. Mild postoperative change at the right parietal lobe. 4. 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   Electronically Signed   By: Roanna RaiderJeffery  Chang M.D.   On: 04/04/2014 02:08    Scheduled Meds: . enoxaparin (LOVENOX) injection  40 mg Subcutaneous Q24H  . hydrALAZINE  50 mg Oral 3 times per day  . hydrochlorothiazide  25 mg Oral Daily  . lisinopril  20 mg Oral Daily  . metoprolol tartrate  50 mg Oral BID  . predniSONE  60 mg Oral Q breakfast  . valACYclovir  1,000 mg Oral TID   Continuous Infusions: . sodium chloride     Antibiotics Given (last 72 hours)   Date/Time Action Medication Dose   04/04/14 1106 Given  [Med not avaliable at scheduled time.]   valACYclovir (VALTREX) tablet 1,000 mg 1,000 mg   04/04/14 1519 Given   valACYclovir (VALTREX) tablet 1,000 mg 1,000 mg   04/04/14 2116 Given   valACYclovir (VALTREX) tablet 1,000 mg 1,000 mg   04/05/14 1026 Given   valACYclovir (VALTREX) tablet 1,000 mg 1,000 mg      Principal Problem:   Hypertensive urgency Active Problems:   Facial palsy   Hypertensive emergency    Time spent: 25 min    Paul [name]  [facility] Pager [phone]. If 7PM-7AM, please contact night-coverage at [email or web address], password [email or web address] 04/05/2014, 11:21 AM  LOS: 1 day

## 2014-04-06 DIAGNOSIS — R42 Dizziness and giddiness: Secondary | ICD-10-CM

## 2014-04-06 LAB — BASIC METABOLIC PANEL
Anion gap: 13 (ref 5–15)
BUN: 17 mg/dL (ref 6–23)
CALCIUM: 9.9 mg/dL (ref 8.4–10.5)
CO2: 25 meq/L (ref 19–32)
Chloride: 99 mEq/L (ref 96–112)
Creatinine, Ser: 1 mg/dL (ref 0.50–1.35)
GFR calc Af Amer: >90 mL/min (ref >90)
GFR calc non Af Amer: >90 mL/min (ref >90)
GLUCOSE: 110 mg/dL — AB (ref 70–99)
Potassium: 4 mEq/L (ref 3.7–5.3)
Sodium: 137 mEq/L (ref 137–147)

## 2014-04-06 LAB — CBC
HEMATOCRIT: 49.6 % (ref 39.0–52.0)
HEMOGLOBIN: 17.1 g/dL — AB (ref 13.0–17.0)
MCH: 27.3 pg (ref 26.0–34.0)
MCHC: 34.5 g/dL (ref 30.0–36.0)
MCV: 79.1 fL (ref 78.0–100.0)
Platelets: 254 10*3/uL (ref 150–400)
RBC: 6.27 MIL/uL — AB (ref 4.22–5.81)
RDW: 13.6 % (ref 11.5–15.5)
WBC: 14 10*3/uL — ABNORMAL HIGH (ref 4.0–10.5)

## 2014-04-06 LAB — PROTIME-INR
INR: 1.04 (ref 0.00–1.49)
Prothrombin Time: 13.6 seconds (ref 11.6–15.2)

## 2014-04-06 LAB — GLUCOSE, CAPILLARY: GLUCOSE-CAPILLARY: 92 mg/dL (ref 70–99)

## 2014-04-06 MED ORDER — HYDRALAZINE HCL 50 MG PO TABS: 50.0000 mg | ORAL_TABLET | Freq: Three times a day (TID) | ORAL | Status: DC

## 2014-04-06 MED ORDER — METOPROLOL TARTRATE 50 MG PO TABS: 50.0000 mg | ORAL_TABLET | Freq: Two times a day (BID) | ORAL | Status: DC

## 2014-04-06 MED ORDER — VALACYCLOVIR HCL 1 G PO TABS: 1000.0000 mg | ORAL_TABLET | Freq: Three times a day (TID) | ORAL | Status: DC

## 2014-04-06 MED ORDER — PREDNISONE 20 MG PO TABS: 60.0000 mg | ORAL_TABLET | Freq: Every day | ORAL | Status: DC

## 2014-04-06 MED ORDER — HYDROCHLOROTHIAZIDE 25 MG PO TABS: 25.0000 mg | ORAL_TABLET | Freq: Every day | ORAL | Status: DC

## 2014-04-06 MED ORDER — LISINOPRIL 40 MG PO TABS: 40.0000 mg | ORAL_TABLET | Freq: Every day | ORAL | Status: DC

## 2014-04-06 NOTE — Discharge Summary (Addendum)
Physician Discharge Summary  Paul [name] ZOX:[id number] DOB: 1986-07-05 DOA: 04/04/2014  PCP: No primary provider on file.  Admit date: 04/04/2014 Discharge date: 04/06/2014  Time spent: 35 minutes  Recommendations for Outpatient Follow-up:  1. Check BP at home and bring to PCP 2. FLP 3 months 3. BMp 1-2 weeks re Cr/K -counseled patient extensively on risks/complications of uncontrolled BP-patient tried to leave AMA several times and requested to be d/c daily- BP still not at optimal control but improved from admission  Discharge Diagnoses:  Principal Problem:   Hypertensive urgency Active Problems:   Facial palsy   Hypertensive emergency   Discharge Condition: improved  Diet recommendation: cardiac  Filed Weights   04/04/14 1400  Weight: 154.5 kg (340 lb 9.8 oz)    History of present illness:  Paul [name] is a 28 y.o. male with history of hypertension, patient has not been taking his medications for last one year, started experiencing difficulty walking with balance issues since yesterday morning with left facial palsy involving the whole of the left face. Patient denies any weakness of the upper or lower extremities or any difficulty swallowing or speaking. Denies any headache fever chills or any trauma. Since the symptoms persisted patient came to the ER late in the night. CT head did not show anything acute. On-call neurologist Dr. Roseanne Reno was consulted by the physician and at this time since patient's left facial palsy is more consistent with possible Bell's palsy Dr. Roseanne Reno has advised to start patient on Valtrex and prednisone and get MRI to rule out stroke. In addition patient was found to have very high blood pressure with systolic blood pressure in the 230s and diastolic more than 110. Patient was given multiple doses of labetalol following which patient's blood pressure has decreased to around 190-180 systolic. Patient states that he usually takes Diovan and Norvasc  which he has not been taking for last one year due to financial issues. Patient otherwise denies any chest pain shortness of breath nausea vomiting abdominal pain diarrhea. Patient's INR is found to be elevated patient denies taking any anticoagulants. Denies having any alcohol issues.   Hospital Course:  Uncontrolled HTN- has not been taking medications at home- running 190s when he checks, no headache, no chest pain  -starting $4 medications from [facility] as costs were preciously a concern  - ? If once medications are taken consistently and BP not better, may need RAD and nephrology referral for tighter BP control  -refused echo -encouraged weight loss  Bells palsy- given steroids/acyclovir x 1 week  Refusing MRI to r/o stroke  HLD- diet changes   Dizziness resolved, patient up walking around   Procedures:    Consultations:  none  Discharge Exam: Filed Vitals:   04/06/14 1038  BP: 170/100  Pulse:   Temp:   Resp:     General: A+Ox3, NAD Cardiovascular: rrr Respiratory: clear  Discharge Instructions You were cared for by a hospitalist during your hospital stay. If you have any questions about your discharge medications or the care you received while you were in the hospital after you are discharged, you can call the unit and asked to speak with the hospitalist on call if the hospitalist that took care of you is not available. Once you are discharged, your primary care physician will handle any further medical issues. Please note that NO REFILLS for any discharge medications will be authorized once you are discharged, as it is imperative that you return to your primary care  physician (or establish a relationship with a primary care physician if you do not have one) for your aftercare needs so that they can reassess your need for medications and monitor your lab values.      Discharge Instructions   Diet - low sodium heart healthy    Complete by:  As directed       Discharge instructions    Complete by:  As directed   Check BP at home -make appointment with health [facility] wellness center or PCP in-network with your insurance BMP 1 week     Increase activity slowly    Complete by:  As directed             Medication List    STOP taking these medications       ibuprofen 200 MG tablet  Commonly known as:  ADVIL,MOTRIN      TAKE these medications       hydrALAZINE 50 MG tablet  Commonly known as:  APRESOLINE  Take 1 tablet (50 mg total) by mouth every 8 (eight) hours.     hydrochlorothiazide 25 MG tablet  Commonly known as:  HYDRODIURIL  Take 1 tablet (25 mg total) by mouth daily.     lisinopril 40 MG tablet  Commonly known as:  PRINIVIL,ZESTRIL  Take 1 tablet (40 mg total) by mouth daily.     metoprolol 50 MG tablet  Commonly known as:  LOPRESSOR  Take 1 tablet (50 mg total) by mouth 2 (two) times daily.     predniSONE 20 MG tablet  Commonly known as:  DELTASONE  Take 3 tablets (60 mg total) by mouth daily with breakfast.     valACYclovir 1000 MG tablet  Commonly known as:  VALTREX  Take 1 tablet (1,000 mg total) by mouth 3 (three) times daily.       No Known Allergies Follow-up Information   Follow up with [facility]. (or PCP in network with your insurance)    Contact information:   [address] [place] [id number] [phone]       The results of significant diagnostics from this hospitalization (including imaging, microbiology, ancillary and laboratory) are listed below for reference.    Significant Diagnostic Studies: Ct Head Wo Contrast  04/04/2014   CLINICAL DATA:  Left facial droop.  Severe hypertension.  EXAM: CT HEAD WITHOUT CONTRAST  TECHNIQUE: Contiguous axial images were obtained from the base of the skull through the vertex without intravenous contrast.  COMPARISON:  None.  FINDINGS: There is no evidence of acute infarction, mass lesion, or intra- or extra-axial  hemorrhage on CT.  A left frontal ventricular shunt is noted ending at the frontal horn of the right lateral ventricle. It extends inferiorly along the left side of the head to the inferior aspect of the left temporal lobe, along the base of the skull. There is no evidence of hydrocephalus at this time.  Mild postoperative change is noted at the right parietal lobe, with an overlying craniotomy flap and defect. Calcification is seen at the basal ganglia bilaterally.  The posterior fossa, including the cerebellum, brainstem and fourth ventricle, is within normal limits. The cerebral hemispheres demonstrate grossly normal gray-white differentiation. No mass effect or midline shift is seen.  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 The orbits are within normal limits. The paranasal sinuses and mastoid air  cells are well-aerated. No significant soft tissue abnormalities are seen.  IMPRESSION: 1. No acute intracranial pathology seen on CT. 2. Ventricular shunt appears grossly intact. No evidence of hydrocephalus at this time. 3. Mild postoperative change at the right parietal lobe. 4. 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   Electronically Signed   By: Roanna Raider M.D.   On: 04/04/2014 02:08    Microbiology: Recent Results (from the past 240 hour(s))  MRSA PCR SCREENING     Status: None   Collection Time    04/04/14  2:11 PM      Result Value Ref Range Status   MRSA by PCR NEGATIVE  NEGATIVE Final   Comment:            The GeneXpert MRSA Assay (FDA     approved for NASAL specimens     only), is one component of a     comprehensive MRSA colonization     surveillance program. It is not     intended to diagnose MRSA     infection nor to guide or     monitor  treatment for     MRSA infections.     Labs: Basic Metabolic Panel:  Recent Labs Lab 04/04/14 0015 04/06/14 0105  NA 141 137  K 4.0 4.0  CL 102 99  CO2 24 25  GLUCOSE 85 110*  BUN 11 17  CREATININE 0.95 1.00  CALCIUM 9.4 9.9   Liver Function Tests:  Recent Labs Lab 04/04/14 0015  AST 17  ALT 22  ALKPHOS 74  BILITOT 0.4  PROT 7.5  ALBUMIN 4.0   No results found for this basename: LIPASE, AMYLASE,  in the last 168 hours No results found for this basename: AMMONIA,  in the last 168 hours CBC:  Recent Labs Lab 04/04/14 0015 04/06/14 0105  WBC 7.2 14.0*  NEUTROABS 4.5  --   HGB 17.4* 17.1*  HCT 51.0 49.6  MCV 78.3 79.1  PLT 268 254   Cardiac Enzymes: No results found for this basename: CKTOTAL, CKMB, CKMBINDEX, TROPONINI,  in the last 168 hours BNP: BNP (last 3 results) No results found for this basename: PROBNP,  in the last 8760 hours CBG:  Recent Labs Lab 04/05/14 0815 04/05/14 1259 04/05/14 1818 04/05/14 1952 04/06/14 0720  GLUCAP 110* 105* 108* 125* 92       Signed:  Zianna Dercole  [facility] 04/06/2014, 10:59 AM

## 2014-04-06 NOTE — Progress Notes (Signed)
Patient discharged to home.  Patient alert, oriented, verbally responsive, breathing regular and non-labored throughout, no s/s of distress noted throughout, no c/o pain throughout.  Discharge instructions thoroughly verbalized to patient and wife at bedside.  Both patient and wfie verbalized understanding throughout.  Patient encouraged to check his blood pressure daily as well as to write down all blood pressure measurements for physician follow-up appointments.  Patient refused wheelchair and left unit ambulating accompanied by wife.  VS WNL.  Reita ClicheWilliams,Inetta Dicke 04/06/2014 1:00 PM

## 2014-04-12 NOTE — ED Provider Notes (Signed)
CSN: [id number]     Arrival date & time 04/03/14  2255 History   First MD Initiated Contact with Patient 04/04/14 0012     Chief Complaint  Patient presents with  . Dizziness     (Consider location/radiation/quality/duration/timing/severity/associated sxs/prior Treatment) HPI Comments: 28 y.o. male with history of hypertension presents to the ER with left sided facial droop, dizziness and elevated BP. Patient has not been taking his medications for last one year, started experiencing difficulty walking with balance issues since yesterday morning with left facial palsy involving the whole of the left face. Patient denies any weakness of the upper or lower extremities or any difficulty swallowing or speaking. Denies any headache fever chills or any trauma. No chest pain, dib. No drug abuse.  The history is provided by the patient.    Past Medical History  Diagnosis Date  . Hypertension    Past Surgical History  Procedure Laterality Date  . Ventriculoperitoneal shunt    . Peg tube placement    . Eye surgery     Family History  Problem Relation Age of Onset  . Hypertension Mother   . Stroke Neg Hx    History  Substance Use Topics  . Smoking status: Never Smoker   . Smokeless tobacco: Not on file  . Alcohol Use: No    Review of Systems  Constitutional: Positive for activity change. Negative for fever and chills.  HENT: Negative for drooling.   Eyes: Negative for visual disturbance.  Respiratory: Negative for cough, chest tightness and shortness of breath.   Cardiovascular: Negative for chest pain.  Gastrointestinal: Negative for abdominal distention.  Genitourinary: Negative for dysuria, enuresis and difficulty urinating.  Musculoskeletal: Negative for arthralgias and neck pain.  Neurological: Positive for dizziness and light-headedness. Negative for speech difficulty and headaches.  Psychiatric/Behavioral: Negative for confusion.      Allergies  Review of patient's  allergies indicates no known allergies.  Home Medications   Prior to Admission medications   Medication Sig Start Date End Date Taking? Authorizing Provider  hydrALAZINE (APRESOLINE) 50 MG tablet Take 1 tablet (50 mg total) by mouth every 8 (eight) hours. 04/06/14   Joseph Art, DO  hydrochlorothiazide (HYDRODIURIL) 25 MG tablet Take 1 tablet (25 mg total) by mouth daily. 04/06/14   Joseph Art, DO  lisinopril (PRINIVIL,ZESTRIL) 40 MG tablet Take 1 tablet (40 mg total) by mouth daily. 04/06/14   Joseph Art, DO  metoprolol (LOPRESSOR) 50 MG tablet Take 1 tablet (50 mg total) by mouth 2 (two) times daily. 04/06/14   Joseph Art, DO  predniSONE (DELTASONE) 20 MG tablet Take 3 tablets (60 mg total) by mouth daily with breakfast. 04/06/14   Joseph Art, DO  valACYclovir (VALTREX) 1000 MG tablet Take 1 tablet (1,000 mg total) by mouth 3 (three) times daily. 04/06/14   Jessica U Vann, DO   BP 170/100  Pulse 75  Temp(Src) 98.7 F (37.1 C) (Oral)  Resp 20  Ht  (1.905 m)  Wt 340 lb 9.8 oz (154.5 kg)  BMI 42.57 kg/m2  SpO2 100% Physical Exam  Nursing note and vitals reviewed. Constitutional: He is oriented to person, place, and time. He appears well-developed.  HENT:  Head: Normocephalic and atraumatic.  Eyes: Conjunctivae and EOM are normal. Pupils are equal, round, and reactive to light.  Neck: Normal range of motion. Neck supple.  Cardiovascular: Normal rate, regular rhythm and intact distal pulses.   Pulmonary/Chest: Effort normal and breath sounds normal.  Abdominal: Soft. Bowel sounds are normal. He exhibits no distension. There is no tenderness. There is no rebound and no guarding.  Neurological: He is alert and oriented to person, place, and time.  Left sided facial droop, with no central sparing. Cerebellar exam is normal (finger to nose) Sensory exam normal for bilateral upper and lower extremities - and patient is able to discriminate between sharp and dull. Motor  exam is 4+/5   Skin: Skin is warm.    ED Course  Procedures (including critical care time) Labs Review Labs Reviewed  CBC - Abnormal; Notable for the following:    RBC 6.51 (*)    Hemoglobin 17.4 (*)    All other components within normal limits  PROTIME-INR - Abnormal; Notable for the following:    Prothrombin Time 24.6 (*)    INR 2.22 (*)    All other components within normal limits  LIPID PANEL - Abnormal; Notable for the following:    LDL Cholesterol 132 (*)    All other components within normal limits  GLUCOSE, CAPILLARY - Abnormal; Notable for the following:    Glucose-Capillary 156 (*)    All other components within normal limits  GLUCOSE, CAPILLARY - Abnormal; Notable for the following:    Glucose-Capillary 148 (*)    All other components within normal limits  GLUCOSE, CAPILLARY - Abnormal; Notable for the following:    Glucose-Capillary 110 (*)    All other components within normal limits  CBC - Abnormal; Notable for the following:    WBC 14.0 (*)    RBC 6.27 (*)    Hemoglobin 17.1 (*)    All other components within normal limits  BASIC METABOLIC PANEL - Abnormal; Notable for the following:    Glucose, Bld 110 (*)    All other components within normal limits  GLUCOSE, CAPILLARY - Abnormal; Notable for the following:    Glucose-Capillary 105 (*)    All other components within normal limits  GLUCOSE, CAPILLARY - Abnormal; Notable for the following:    Glucose-Capillary 108 (*)    All other components within normal limits  GLUCOSE, CAPILLARY - Abnormal; Notable for the following:    Glucose-Capillary 125 (*)    All other components within normal limits  MRSA PCR SCREENING  DIFFERENTIAL  COMPREHENSIVE METABOLIC PANEL  ETHANOL  APTT  URINE RAPID DRUG SCREEN (HOSP PERFORMED)  URINALYSIS, ROUTINE W REFLEX MICROSCOPIC  HEMOGLOBIN A1C  HIV ANTIBODY (ROUTINE TESTING)  PROTIME-INR  PROTIME-INR  GLUCOSE, CAPILLARY  I-STAT TROPOININ, ED    Imaging Review No  results found.   EKG Interpretation   Date/Time:  Thursday April 03 2014 23:32:43 EDT Ventricular Rate:  88 PR Interval:  144 QRS Duration: 84 QT Interval:  334 QTC Calculation: 404 R Axis:   134 Text Interpretation:  Normal sinus rhythm with sinus arrhythmia Lateral  infarct , age undetermined Abnormal ECG No acute changes inferior t wave  inversions Confirmed by Rhunette Croft, MD, Janey Genta [phone]) on 04/04/2014 12:11:48  AM      MDM   Final diagnoses:  Hypertensive urgency  Bell's palsy  Abnormal EKG  Dizziness  Hypertensive emergency  CRITICAL CARE Performed by: Derwood Kaplan   Total critical care time: 50 minutes - HTN emergency needing multiple dose of iv labetalol  Critical care time was exclusive of separately billable procedures and treating other patients.  Critical care was necessary to treat or prevent imminent or life-threatening deterioration.  Critical care was time spent personally by me on the following activities:  development of treatment plan with patient and/or surrogate as well as nursing, discussions with consultants, evaluation of patient's response to treatment, examination of patient, obtaining history from patient or surrogate, ordering and performing treatments and interventions, ordering and review of laboratory studies, ordering and review of radiographic studies, pulse oximetry and re-evaluation of patient's condition.   Pt comes in with cc of facial droop, dizziness. Noted to have severely elevated MAP in the 150s at arrival. The facial complains are consistent with a droop, CT head is negative for acute process, last normal was well outside of the TPA window. Still, give marked BP elevation and presence of dizziness, i spoke with Dr. Roseanne Reno, who also thinks pt likely has bells palsy, and recommends BP management for now and mri if needed.  Pt given iv labetalol (20 mg, 40 mg and then 80 mg), to get to a 180s/110 (MAP 130s), which is short of our  GOAL of 120s in the ER, but medicine has accepted the patient on their service, for optimal care,  The Bells tx started. BP being treated aggressively, as dizziness could be due to the elevated BP, which would mean pt has htn emergency.  Derwood Kaplan, MD 04/12/14 [phone]

## 2014-04-15 ENCOUNTER — Encounter: Payer: Self-pay | Admitting: Internal Medicine

## 2014-04-15 ENCOUNTER — Ambulatory Visit: Payer: No Typology Code available for payment source | Attending: Internal Medicine | Admitting: Internal Medicine

## 2014-04-15 VITALS — BP 160/90 | HR 86 | Temp 98.9°F | Resp 16 | Wt 340.2 lb

## 2014-04-15 DIAGNOSIS — E669 Obesity, unspecified: Secondary | ICD-10-CM | POA: Diagnosis not present

## 2014-04-15 DIAGNOSIS — Z139 Encounter for screening, unspecified: Secondary | ICD-10-CM | POA: Insufficient documentation

## 2014-04-15 DIAGNOSIS — I10 Essential (primary) hypertension: Secondary | ICD-10-CM

## 2014-04-15 DIAGNOSIS — I1A0 Resistant hypertension: Secondary | ICD-10-CM

## 2014-04-15 DIAGNOSIS — G510 Bell's palsy: Secondary | ICD-10-CM | POA: Diagnosis not present

## 2014-04-15 DIAGNOSIS — R030 Elevated blood-pressure reading, without diagnosis of hypertension: Secondary | ICD-10-CM

## 2014-04-15 DIAGNOSIS — IMO0001 Reserved for inherently not codable concepts without codable children: Secondary | ICD-10-CM

## 2014-04-15 HISTORY — DX: Essential (primary) hypertension: I10

## 2014-04-15 HISTORY — DX: Resistant hypertension: I1A.0

## 2014-04-15 LAB — CBC WITH DIFFERENTIAL/PLATELET
BASOS ABS: 0 10*3/uL (ref 0.0–0.1)
BASOS PCT: 0 % (ref 0–1)
EOS ABS: 0.1 10*3/uL (ref 0.0–0.7)
Eosinophils Relative: 1 % (ref 0–5)
HCT: 52.9 % — ABNORMAL HIGH (ref 39.0–52.0)
HEMOGLOBIN: 17.9 g/dL — AB (ref 13.0–17.0)
Lymphocytes Relative: 34 % (ref 12–46)
Lymphs Abs: 2.6 10*3/uL (ref 0.7–4.0)
MCH: 26.6 pg (ref 26.0–34.0)
MCHC: 33.8 g/dL (ref 30.0–36.0)
MCV: 78.6 fL (ref 78.0–100.0)
Monocytes Absolute: 0.5 10*3/uL (ref 0.1–1.0)
Monocytes Relative: 7 % (ref 3–12)
NEUTROS PCT: 58 % (ref 43–77)
Neutro Abs: 4.4 10*3/uL (ref 1.7–7.7)
Platelets: 263 10*3/uL (ref 150–400)
RBC: 6.73 MIL/uL — ABNORMAL HIGH (ref 4.22–5.81)
RDW: 14 % (ref 11.5–15.5)
WBC: 7.6 10*3/uL (ref 4.0–10.5)

## 2014-04-15 MED ORDER — CLONIDINE HCL 0.1 MG PO TABS
0.1000 mg | ORAL_TABLET | Freq: Once | ORAL | Status: AC
  Administered 2014-04-15: 0.1 mg via ORAL

## 2014-04-15 NOTE — Patient Instructions (Signed)
DASH Eating Plan °DASH stands for "Dietary Approaches to Stop Hypertension." The DASH eating plan is a healthy eating plan that has been shown to reduce high blood pressure (hypertension). Additional health benefits may include reducing the risk of type 2 diabetes mellitus, heart disease, and stroke. The DASH eating plan may also help with weight loss. °WHAT DO I NEED TO KNOW ABOUT THE DASH EATING PLAN? °For the DASH eating plan, you will follow these general guidelines: °· Choose foods with a percent daily value for sodium of less than 5% (as listed on the food label). °· Use salt-free seasonings or herbs instead of table salt or sea salt. °· Check with your health care provider or pharmacist before using salt substitutes. °· Eat lower-sodium products, often labeled as "lower sodium" or "no salt added." °· Eat fresh foods. °· Eat more vegetables, fruits, and low-fat dairy products. °· Choose whole grains. Look for the word "whole" as the first word in the ingredient list. °· Choose fish and skinless chicken or turkey more often than red meat. Limit fish, poultry, and meat to 6 oz (170 g) each day. °· Limit sweets, desserts, sugars, and sugary drinks. °· Choose heart-healthy fats. °· Limit cheese to 1 oz (28 g) per day. °· Eat more home-cooked food and less restaurant, buffet, and fast food. °· Limit fried foods. °· Cook foods using methods other than frying. °· Limit canned vegetables. If you do use them, rinse them well to decrease the sodium. °· When eating at a restaurant, ask that your food be prepared with less salt, or no salt if possible. °WHAT FOODS CAN I EAT? °Seek help from a dietitian for individual calorie needs. °Grains °Whole grain or whole wheat bread. Brown rice. Whole grain or whole wheat pasta. Quinoa, bulgur, and whole grain cereals. Low-sodium cereals. Corn or whole wheat flour tortillas. Whole grain cornbread. Whole grain crackers. Low-sodium crackers. °Vegetables °Fresh or frozen vegetables  (raw, steamed, roasted, or grilled). Low-sodium or reduced-sodium tomato and vegetable juices. Low-sodium or reduced-sodium tomato sauce and paste. Low-sodium or reduced-sodium canned vegetables.  °Fruits °All fresh, canned (in natural juice), or frozen fruits. °Meat and Other Protein Products °Ground beef (85% or leaner), grass-fed beef, or beef trimmed of fat. Skinless chicken or turkey. Ground chicken or turkey. Pork trimmed of fat. All fish and seafood. Eggs. Dried beans, peas, or lentils. Unsalted nuts and seeds. Unsalted canned beans. °Dairy °Low-fat dairy products, such as skim or 1% milk, 2% or reduced-fat cheeses, low-fat ricotta or cottage cheese, or plain low-fat yogurt. Low-sodium or reduced-sodium cheeses. °Fats and Oils °Tub margarines without trans fats. Light or reduced-fat mayonnaise and salad dressings (reduced sodium). Avocado. Safflower, olive, or canola oils. Natural peanut or almond butter. °Other °Unsalted popcorn and pretzels. °The items listed above may not be a complete list of recommended foods or beverages. Contact your dietitian for more options. °WHAT FOODS ARE NOT RECOMMENDED? °Grains °White bread. White pasta. White rice. Refined cornbread. Bagels and croissants. Crackers that contain trans fat. °Vegetables °Creamed or fried vegetables. Vegetables in a cheese sauce. Regular canned vegetables. Regular canned tomato sauce and paste. Regular tomato and vegetable juices. °Fruits °Dried fruits. Canned fruit in light or heavy syrup. Fruit juice. °Meat and Other Protein Products °Fatty cuts of meat. Ribs, chicken wings, bacon, sausage, bologna, salami, chitterlings, fatback, hot dogs, bratwurst, and packaged luncheon meats. Salted nuts and seeds. Canned beans with salt. °Dairy °Whole or 2% milk, cream, half-and-half, and cream cheese. Whole-fat or sweetened yogurt. Full-fat   cheeses or blue cheese. Nondairy creamers and whipped toppings. Processed cheese, cheese spreads, or cheese  curds. °Condiments °Onion and garlic salt, seasoned salt, table salt, and sea salt. Canned and packaged gravies. Worcestershire sauce. Tartar sauce. Barbecue sauce. Teriyaki sauce. Soy sauce, including reduced sodium. Steak sauce. Fish sauce. Oyster sauce. Cocktail sauce. Horseradish. Ketchup and mustard. Meat flavorings and tenderizers. Bouillon cubes. Hot sauce. Tabasco sauce. Marinades. Taco seasonings. Relishes. °Fats and Oils °Butter, stick margarine, lard, shortening, ghee, and bacon fat. Coconut, palm kernel, or palm oils. Regular salad dressings. °Other °Pickles and olives. Salted popcorn and pretzels. °The items listed above may not be a complete list of foods and beverages to avoid. Contact your dietitian for more information. °WHERE CAN I FIND MORE INFORMATION? °National [employer], [employer], [employer] Institute: www.nhlbi.nih.gov/health/health-topics/topics/dash/ °Document Released: 07/28/2011 Document Revised: 12/23/2013 Document Reviewed: 06/12/2013 °Exit[employer]® Patient Information ©2015 [employer]. This information is not intended to replace advice given to you by your health care provider. Make sure you discuss any questions you have with your health care provider. ° °

## 2014-04-15 NOTE — Progress Notes (Signed)
Patient here to establish care Recently hospitalized for elevated blood pressure Has only been taking his amlodipine because he does not Like the side affects of his other medications

## 2014-04-15 NOTE — Progress Notes (Signed)
Patient Demographics  Paul [name], is a 28 y.o. male  GNF:[id number]  QIO:[id number]  DOB - 05/27/1986  CC:  Chief Complaint  Patient presents with  . Establish Care       HPI: Paul [name] is a 28 y.o. male here today to establish medical care.Patient has history of hypertension, recently hospitalized her with the symptoms of left facial palsy elevated her blood pressure hypertensive urgency emergency dizziness, EMR reviewed patient has a different chart number ([id number]) patient had a CT scan done which was negative for any acute pathology, as per neurology recommendation patient was treated  with a steroid and valacyclovir and he reports improvement  in the symptoms, today's blood pressure is elevated as per patient he is only taking amlodipine 10 mg, he was discharged to take lisinopril hydrochlorothiazide, hydralazine, metoprolol, as per patient he stopped medication because he didn't like the side effects. Patient has No headache, No chest pain, No abdominal pain - No Nausea, No new weakness tingling or numbness, No Cough - SOB.  No Known Allergies Past Medical History  Diagnosis Date  . Hypertension    Current Outpatient Prescriptions on File Prior to Visit  Medication Sig Dispense Refill  . amLODipine (NORVASC) 10 MG tablet Take 1 tablet (10 mg total) by mouth daily.  30 tablet  2   No current facility-administered medications on file prior to visit.   Family History  Problem Relation Age of Onset  . Diabetes Paternal Grandmother   . Hypertension Paternal Grandmother    History   Social History  . Marital Status: Married    Spouse Name: N/A    Number of Children: N/A  . Years of Education: N/A   Occupational History  . Not on file.   Social History Main Topics  . Smoking status: Never Smoker   . Smokeless tobacco: Not on file  . Alcohol Use: No  . Drug Use: No  . Sexual Activity: Not on file   Other Topics Concern  . Not on file   Social  History Narrative  . No narrative on file    Review of Systems: Constitutional: Negative for fever, chills, diaphoresis, activity change, appetite change and fatigue. HENT: Negative for ear pain, nosebleeds, congestion, facial swelling, rhinorrhea, neck pain, neck stiffness and ear discharge.  Eyes: Negative for pain, discharge, redness, itching and visual disturbance. Respiratory: Negative for cough, choking, chest tightness, shortness of breath, wheezing and stridor.  Cardiovascular: Negative for chest pain, palpitations and leg swelling. Gastrointestinal: Negative for abdominal distention. Genitourinary: Negative for dysuria, urgency, frequency, hematuria, flank pain, decreased urine volume, difficulty urinating and dyspareunia.  Musculoskeletal: Negative for back pain, joint swelling, arthralgia and gait problem. Neurological: Negative for dizziness, tremors, seizures, syncope, facial asymmetry, speech difficulty, weakness, light-headedness, numbness and headaches.  Hematological: Negative for adenopathy. Does not bruise/bleed easily. Psychiatric/Behavioral: Negative for hallucinations, behavioral problems, confusion, dysphoric mood, decreased concentration and agitation.    Objective:   Filed Vitals:   04/15/14 1740  BP: 160/90  Pulse:   Temp:   Resp:     Physical Exam: Constitutional: Obese male sitting comfortably not in acute distress HENT: Normocephalic, atraumatic, External right and left ear normal. Oropharynx is clear and moist.  Eyes: Conjunctivae and EOM are normal. PERRLA, no scleral icterus. Neck: Normal ROM. Neck supple. No JVD. No tracheal deviation. No thyromegaly. CVS: RRR, S1/S2 +, no murmurs, no gallops, no carotid bruit.  Pulmonary: Effort and breath sounds normal, no stridor, rhonchi, wheezes, rales.  Abdominal: Soft. BS +, no distension, tenderness, rebound or guarding.  Musculoskeletal: Normal range of motion. No edema and no tenderness.  Neuro: Alert.  Normal reflexes, muscle tone coordination. No cranial nerve deficit. Left facial residual pulsy. Skin: Skin is warm and dry. No rash noted. Not diaphoretic. No erythema. No pallor. Psychiatric: Normal mood and affect. Behavior, judgment, thought content normal.  Lab Results  Component Value Date   HGB 21.4* 06/12/2013   HCT 63.0* 06/12/2013   Lab Results  Component Value Date   CREATININE 1.50* 06/12/2013   BUN 16 06/12/2013   NA 143 06/12/2013   K 3.8 06/12/2013   CL 106 06/12/2013    No results found for this basename: HGBA1C   Lipid Panel  No results found for this basename: chol, trig, hdl, cholhdl, vldl, ldlcalc       Assessment and plan:   1. Elevated blood pressure  - cloNIDine (CATAPRES) tablet 0.1 mg; Take 1 tablet (0.1 mg total) by mouth once. His repeat blood pressure is 160/90.  2. Essential hypertension, benign I have advised patient for DASH diet, since he was concerned about side effects of the medication, he is willing to take 3 medications, he will continue to take hydralazine, lisinopril, amlodipine, he will come back in 2 weeks for BP check - COMPLETE METABOLIC PANEL WITH GFR  3. Facial palsy Symptomatically improved was treated with prednisone and valacyclovir.  4. Obesity, unspecified Advise for diet and exercise.  5. Screening Ordered baseline blood work. - CBC with Differential - TSH - Vit D  25 hydroxy (rtn osteoporosis monitoring) - Hemoglobin A1c  NOTE.. patient has 2 charts The hospitalization record is documented in MR#  ([id number])   Return in about 3 months (around 07/16/2014) for hypertension, BP check in 2 weeks/Nurse Visit.    Doris Cheadle, MD

## 2014-04-16 ENCOUNTER — Telehealth: Payer: Self-pay

## 2014-04-16 DIAGNOSIS — E669 Obesity, unspecified: Secondary | ICD-10-CM | POA: Insufficient documentation

## 2014-04-16 DIAGNOSIS — G510 Bell's palsy: Secondary | ICD-10-CM | POA: Insufficient documentation

## 2014-04-16 LAB — COMPLETE METABOLIC PANEL WITH GFR
ALBUMIN: 4.9 g/dL (ref 3.5–5.2)
ALK PHOS: 70 U/L (ref 39–117)
ALT: 44 U/L (ref 0–53)
AST: 22 U/L (ref 0–37)
BILIRUBIN TOTAL: 0.6 mg/dL (ref 0.2–1.2)
BUN: 10 mg/dL (ref 6–23)
CO2: 25 mEq/L (ref 19–32)
Calcium: 10 mg/dL (ref 8.4–10.5)
Chloride: 98 mEq/L (ref 96–112)
Creat: 0.97 mg/dL (ref 0.50–1.35)
GFR, Est African American: >89 mL/min
GLUCOSE: 95 mg/dL (ref 70–99)
POTASSIUM: 4.5 meq/L (ref 3.5–5.3)
SODIUM: 137 meq/L (ref 135–145)
Total Protein: 7.6 g/dL (ref 6.0–8.3)

## 2014-04-16 LAB — HEMOGLOBIN A1C
HEMOGLOBIN A1C: 5.6 % (ref <5.7)
MEAN PLASMA GLUCOSE: 114 mg/dL (ref <117)

## 2014-04-16 LAB — VITAMIN D 25 HYDROXY (VIT D DEFICIENCY, FRACTURES): Vit D, 25-Hydroxy: 10 ng/mL — ABNORMAL LOW (ref 30–89)

## 2014-04-16 LAB — TSH: TSH: 0.01 u[IU]/mL — ABNORMAL LOW (ref 0.350–4.500)

## 2014-04-16 MED ORDER — VITAMIN D (ERGOCALCIFEROL) 1.25 MG (50000 UNIT) PO CAPS: 50000.0000 [IU] | ORAL_CAPSULE | ORAL | Status: DC

## 2014-04-16 NOTE — Telephone Encounter (Signed)
Message copied by Lestine Mount on Wed Apr 16, 2014 11:19 AM ------      Message from: Doris Cheadle      Created: Wed Apr 16, 2014  9:27 AM       Blood work reviewed, noticed low vitamin D, call patient advise to start ergocalciferol 50,000 units once a week for the duration of  12 weeks.      Also noticed abnormal TSH level, will repeat and do full TFT panel on the following visit. ------

## 2014-04-16 NOTE — Telephone Encounter (Signed)
Patient not available Left message on voice mail to return our call 

## 2014-05-07 ENCOUNTER — Encounter (HOSPITAL_COMMUNITY): Payer: Self-pay

## 2016-09-26 DIAGNOSIS — Z3141 Encounter for fertility testing: Secondary | ICD-10-CM | POA: Diagnosis not present

## 2016-10-12 ENCOUNTER — Encounter: Payer: Self-pay | Admitting: Family Medicine

## 2016-10-12 ENCOUNTER — Ambulatory Visit (INDEPENDENT_AMBULATORY_CARE_PROVIDER_SITE_OTHER): Payer: BLUE CROSS/BLUE SHIELD | Admitting: Family Medicine

## 2016-10-12 VITALS — BP 170/100 | HR 76 | Temp 98.6°F | Ht 75.0 in | Wt 345.0 lb

## 2016-10-12 DIAGNOSIS — I10 Essential (primary) hypertension: Secondary | ICD-10-CM | POA: Diagnosis not present

## 2016-10-12 DIAGNOSIS — R739 Hyperglycemia, unspecified: Secondary | ICD-10-CM

## 2016-10-12 DIAGNOSIS — E669 Obesity, unspecified: Secondary | ICD-10-CM | POA: Diagnosis not present

## 2016-10-12 LAB — COMPLETE METABOLIC PANEL WITH GFR
ALBUMIN: 4.3 g/dL (ref 3.6–5.1)
ALK PHOS: 67 U/L (ref 40–115)
ALT: 26 U/L (ref 9–46)
AST: 16 U/L (ref 10–40)
BILIRUBIN TOTAL: 0.8 mg/dL (ref 0.2–1.2)
BUN: 12 mg/dL (ref 7–25)
CALCIUM: 9.4 mg/dL (ref 8.6–10.3)
CO2: 27 mmol/L (ref 20–31)
Chloride: 104 mmol/L (ref 98–110)
Creat: 1.09 mg/dL (ref 0.60–1.35)
Glucose, Bld: 86 mg/dL (ref 65–99)
POTASSIUM: 4.4 mmol/L (ref 3.5–5.3)
SODIUM: 139 mmol/L (ref 135–146)
TOTAL PROTEIN: 7.1 g/dL (ref 6.1–8.1)

## 2016-10-12 LAB — CBC
HCT: 52 % — ABNORMAL HIGH (ref 38.5–50.0)
HEMOGLOBIN: 17.4 g/dL — AB (ref 13.2–17.1)
MCH: 27.6 pg (ref 27.0–33.0)
MCHC: 33.5 g/dL (ref 32.0–36.0)
MCV: 82.5 fL (ref 80.0–100.0)
MPV: 10.4 fL (ref 7.5–12.5)
Platelets: 217 10*3/uL (ref 140–400)
RBC: 6.3 MIL/uL — ABNORMAL HIGH (ref 4.20–5.80)
RDW: 14 % (ref 11.0–15.0)
WBC: 5.4 10*3/uL (ref 3.8–10.8)

## 2016-10-12 LAB — LIPID PANEL
CHOLESTEROL: 219 mg/dL — AB (ref <200)
HDL: 39 mg/dL — ABNORMAL LOW (ref >40)
LDL CALC: 147 mg/dL — AB (ref <100)
Total CHOL/HDL Ratio: 5.6 Ratio — ABNORMAL HIGH (ref <5.0)
Triglycerides: 166 mg/dL — ABNORMAL HIGH (ref <150)
VLDL: 33 mg/dL — AB (ref <30)

## 2016-10-12 LAB — POCT GLYCOSYLATED HEMOGLOBIN (HGB A1C): Hemoglobin A1C: 5.3

## 2016-10-12 MED ORDER — AMLODIPINE BESYLATE 10 MG PO TABS: 10.0000 mg | ORAL_TABLET | Freq: Every day | ORAL | 2 refills | Status: DC

## 2016-10-12 NOTE — Assessment & Plan Note (Signed)
Significantly elevated today. Patient has not been on any medication for several months. Will restart norvasc today. Follow up in 1-2 weeks. Check CMET today. Will likely need additional agents to achieve goal BP.

## 2016-10-12 NOTE — Patient Instructions (Addendum)
Restart the amlodipine.  Come back in 1-2 weeks for a recheck.  Let me know if your blood pressure is consistently above 140/90.  Take care,  Dr Jimmey RalphParker

## 2016-10-12 NOTE — Assessment & Plan Note (Signed)
Discussed lifestyle modifications today, however was not fully dicussed due to time constraints as patient arrived 21 minutes late to appointment. Will check lipid panel and A1c today.

## 2016-10-12 NOTE — Progress Notes (Signed)
Subjective:  Paul [name] is a 31 y.o. male who [facility] today to establish care.  HPI:  Obesity Patient lives sedentary life. Does not regularly exercise.   HTN  BP Readings from Last 3 Encounters:  10/12/16 (!) 170/100  04/15/14 (!) 160/90  04/06/14 (!) 170/100   Home BP monitoring-Yes, Usually run in 150s.  Compliant with medications-Not on any medications - has norvasc, hydralazine, HCTZ, lisinopril, and metoprolol on med list, but has not been on for 6 months.  ROS-Denies any CP, HA, SOB, blurry vision, LE edema, transient weakness, orthopnea, PND.   ROS: All systems reviewed and are negative  PMH:  The following were reviewed and entered/updated in epic: Past Medical History:  Diagnosis Date  . Hypertension    Patient Active Problem List   Diagnosis Date Noted  . Obesity 04/16/2014  . Essential hypertension, benign 04/15/2014  . Facial palsy 04/04/2014   Past Surgical History:  Procedure Laterality Date  . EYE SURGERY    . HEMATOMA EVACUATION     2003  . PEG TUBE PLACEMENT    . VENTRICULOPERITONEAL SHUNT      Family History  Problem Relation Age of Onset  . Hypertension Mother   . Multiple sclerosis Mother   . Diabetes Paternal Grandmother   . Hypertension Paternal Grandmother   . Stroke Neg Hx     Medications- reviewed and updated Current Outpatient Prescriptions  Medication Sig Dispense Refill  . amLODipine (NORVASC) 10 MG tablet Take 1 tablet (10 mg total) by mouth daily. 30 tablet 2   No current facility-administered medications for this visit.     Allergies-reviewed and updated No Known Allergies  Social History   Social History  . Marital status: Married    Spouse name: N/A  . Number of children: N/A  . Years of education: N/A   Occupational History  . Tech Support    Social History Main Topics  . Smoking status: Never Smoker  . Smokeless tobacco: Never Used  . Alcohol use No  . Drug use: No  . Sexual  activity: Not on file   Other Topics Concern  . 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 MMM CV: RRR with no murmurs appreciated Pulm: NWOB, CTAB with no crackles, wheezes, or rhonchi GI: Morbidly obese, Normal bowel sounds present. Soft, Nontender, Nondistended. MSK: no edema, cyanosis, or clubbing noted Skin: warm, dry Neuro: Right lateral gaze palsy (chronic for patient), otherwise CN2-12 intact. Strength 5/5 in upper and lower extremities. Sensation intact throughout. Psych: Normal affect and thought content  Assessment/Plan:  Essential hypertension, benign Significantly elevated today. Patient has not been on any medication for several months. Will restart norvasc today. Follow up in 1-2 weeks. Check CMET today. Will likely need additional agents to achieve goal BP.   Obesity Discussed lifestyle modifications today, however was not fully dicussed due to time constraints as patient arrived 21 minutes late to appointment. Will check lipid panel and A1c today.   Katina Degreealeb M. Jimmey RalphParker, MD [facility] Resident PGY-3 10/12/2016  10:13 AM

## 2016-10-17 ENCOUNTER — Telehealth: Payer: Self-pay | Admitting: Family Medicine

## 2016-10-17 NOTE — Telephone Encounter (Signed)
Called pt and left a detailed VM informing him his insurance forms were sent off and he needs to call our office and make a FU apt with PCP. Please help him in doing this when he calls back,

## 2016-10-17 NOTE — Telephone Encounter (Signed)
Called patient to discuss labwork. Notable for elevated cholesterol, otherwise normal. Patient's insurance forms were completed and faxed off. Patient should follow up in 1-2 weeks.   Katina Degreealeb M. Jimmey RalphParker, MD [facility] Resident PGY-3 10/17/2016 11:38 AM

## 2016-10-22 ENCOUNTER — Other Ambulatory Visit: Payer: Self-pay

## 2016-10-22 ENCOUNTER — Encounter (HOSPITAL_COMMUNITY): Payer: Self-pay | Admitting: Emergency Medicine

## 2016-10-22 ENCOUNTER — Emergency Department (HOSPITAL_COMMUNITY)
Admission: EM | Admit: 2016-10-22 | Discharge: 2016-10-22 | Disposition: A | Discharge status: Home / Self Care | Payer: BLUE CROSS/BLUE SHIELD | Attending: Emergency Medicine | Admitting: Emergency Medicine

## 2016-10-22 DIAGNOSIS — I160 Hypertensive urgency: Secondary | ICD-10-CM

## 2016-10-22 DIAGNOSIS — I10 Essential (primary) hypertension: Secondary | ICD-10-CM | POA: Diagnosis not present

## 2016-10-22 DIAGNOSIS — Z79899 Other long term (current) drug therapy: Secondary | ICD-10-CM | POA: Diagnosis not present

## 2016-10-22 LAB — BASIC METABOLIC PANEL
ANION GAP: 11 (ref 5–15)
BUN: 8 mg/dL (ref 6–20)
CALCIUM: 9.7 mg/dL (ref 8.9–10.3)
CHLORIDE: 100 mmol/L — AB (ref 101–111)
CO2: 25 mmol/L (ref 22–32)
Creatinine, Ser: 1.13 mg/dL (ref 0.61–1.24)
GFR calc Af Amer: >60 mL/min (ref >60)
GFR calc non Af Amer: >60 mL/min (ref >60)
GLUCOSE: 115 mg/dL — AB (ref 65–99)
Potassium: 4.1 mmol/L (ref 3.5–5.1)
Sodium: 136 mmol/L (ref 135–145)

## 2016-10-22 LAB — CBC WITH DIFFERENTIAL/PLATELET
BASOS PCT: 0 %
Basophils Absolute: 0 10*3/uL (ref 0.0–0.1)
EOS ABS: 0 10*3/uL (ref 0.0–0.7)
Eosinophils Relative: 1 %
HCT: 53.6 % — ABNORMAL HIGH (ref 39.0–52.0)
Hemoglobin: 18.2 g/dL — ABNORMAL HIGH (ref 13.0–17.0)
Lymphocytes Relative: 15 %
Lymphs Abs: 1.1 10*3/uL (ref 0.7–4.0)
MCH: 27.9 pg (ref 26.0–34.0)
MCHC: 34 g/dL (ref 30.0–36.0)
MCV: 82.2 fL (ref 78.0–100.0)
MONO ABS: 0.3 10*3/uL (ref 0.1–1.0)
MONOS PCT: 4 %
NEUTROS PCT: 80 %
Neutro Abs: 5.7 10*3/uL (ref 1.7–7.7)
Platelets: 226 10*3/uL (ref 150–400)
RBC: 6.52 MIL/uL — ABNORMAL HIGH (ref 4.22–5.81)
RDW: 13.2 % (ref 11.5–15.5)
WBC: 7.1 10*3/uL (ref 4.0–10.5)

## 2016-10-22 MED ORDER — LABETALOL HCL 200 MG PO TABS
200.0000 mg | ORAL_TABLET | Freq: Once | ORAL | Status: AC
  Administered 2016-10-22: 200 mg via ORAL
  Filled 2016-10-22: qty 1

## 2016-10-22 NOTE — Discharge Instructions (Signed)
Fill your prescription for amlodipine and begin taking this as prescribed.  Keep a record of your blood pressures over the next week, and take these with you to your next doctor's appointment.  Return to the emergency department if your symptoms significantly worsen or change.

## 2016-10-22 NOTE — ED Notes (Signed)
ED Provider at bedside. 

## 2016-10-22 NOTE — ED Triage Notes (Signed)
Pt presents to ED for HTN - [facility] and it read 230/140. Pt endorses hx of this and has had medication in the past, but has not taken it or picked it up recently. Pt denies pain.

## 2016-10-22 NOTE — ED Notes (Signed)
Pt departed in NAD.  

## 2016-10-22 NOTE — ED Provider Notes (Signed)
[facility]-EMER[facility]ENCY DEPT Provider Note   CSN: [id number] Arrival date & [id number]: 10/22/16  0246     History   Chief Complaint Chief Complaint  Patient presents with  . Hypertension    HPI Paul [name] is a 31 y.o. male.  Patient is a 31 year old male with no prior cardiac history. He had a doctor's appointment earlier this week and was found to have elevated blood pressure. His provider called in a prescription for Norvasc which he has not yet filled. He presents tonight with elevated blood pressure. He apparently told his blood pressure while at the grocery store and it was markedly elevated. He denies any headache, chest pain, difficulty breathing he denies any specific symptoms.   The history is provided by the patient.  Hypertension  This is a new problem. Episode onset: 1 week ago. The problem occurs constantly. The problem has not changed since onset.Pertinent negatives include no chest pain, no headaches and no shortness of breath. Nothing aggravates the symptoms. Nothing relieves the symptoms. He has tried nothing for the symptoms.    Past Medical History:  Diagnosis Date  . Hypertension     Patient Active Problem List   Diagnosis Date Noted  . Obesity 04/16/2014  . Essential hypertension, benign 04/15/2014  . Facial palsy 04/04/2014    Past Surgical History:  Procedure Laterality Date  . EYE SURGERY    . HEMATOMA EVACUATION     2003  . PEG TUBE PLACEMENT    . VENTRICULOPERITONEAL SHUNT         Home Medications    Prior to Admission medications   Medication Sig Start Date End Date Taking? Authorizing Provider  amLODipine (NORVASC) 10 MG tablet Take 1 tablet (10 mg total) by mouth daily. 10/12/16   Ardith Darkaleb M Parker, MD    Family History Family History  Problem Relation Age of Onset  . Hypertension Mother   . Multiple sclerosis Mother   . Diabetes Paternal Grandmother   . Hypertension Paternal Grandmother   . Stroke Neg Hx     Social History Social  History  Substance Use Topics  . Smoking status: Never Smoker  . Smokeless tobacco: Never Used  . Alcohol use No     Allergies   Patient has no known allergies.   Review of Systems Review of Systems  Respiratory: Negative for shortness of breath.   Cardiovascular: Negative for chest pain.  Neurological: Negative for headaches.  All other systems reviewed and are negative.    Physical Exam Updated Vital Signs BP (!) 208/145   Pulse 87   Temp 98.1 F (36.7 C) (Oral)   Resp 16   SpO2 97%   Physical Exam  Constitutional: He is oriented to person, place, and time. He appears well-developed and well-nourished. No distress.  HENT:  Head: Normocephalic and atraumatic.  Mouth/Throat: Oropharynx is clear and moist.  Eyes: EOM are normal. Pupils are equal, round, and reactive to light.  Neck: Normal range of motion. Neck supple.  Cardiovascular: Normal rate and regular rhythm.  Exam reveals no friction rub.   No murmur heard. Pulmonary/Chest: Effort normal and breath sounds normal. No respiratory distress. He has no wheezes. He has no rales.  Abdominal: Soft. Bowel sounds are normal. He exhibits no distension. There is no tenderness.  Musculoskeletal: Normal range of motion. He exhibits no edema.  Neurological: He is alert and oriented to person, place, and time. No cranial nerve deficit. He exhibits normal muscle tone. Coordination normal.  Skin: Skin  is warm and dry. He is not diaphoretic.  Nursing note and vitals reviewed.    ED Treatments / Results  Labs (all labs ordered are listed, but only abnormal results are displayed) Labs Reviewed  BASIC METABOLIC PANEL  CBC WITH DIFFERENTIAL/PLATELET    EKG  EKG Interpretation  Date/Time:  Saturday October 22 2016 08:65:78 EST Ventricular Rate:  86 PR Interval:    QRS Duration: 87 QT Interval:  340 QTC Calculation: 407 R Axis:   49 Text Interpretation:  Sinus rhythm Consider right atrial enlargement Probable  anteroseptal infarct, old Nonspecific T abnormalities, lateral leads Confirmed by Elfreda Blanchet  MD, Shamaya Kauer ([id number]) on 10/22/2016 5:12:03 AM       Radiology No results found.  Procedures Procedures (including critical care time)  Medications Ordered in ED Medications  labetalol (NORMODYNE) tablet 200 mg (not administered)     Initial Impression / Assessment and Plan / ED Course  I have reviewed the triage vital signs and the nursing notes.  Pertinent labs & imaging results that were available during my care of the patient were reviewed by me and considered in my medical decision making (see chart for details).  Blood pressures have improved with medications administered in the emergency department. There is no sign of end organ damage in his workup. He is to fill his prescriptions that are waiting for him at the pharmacy and follow-up with his primary Dr. for a recheck of his blood pressure.  Final Clinical Impressions(s) / ED Diagnoses   Final diagnoses:  None    New Prescriptions New Prescriptions   No medications on file     Geoffery Lyons, MD 10/23/16 [phone]

## 2017-04-03 ENCOUNTER — Other Ambulatory Visit: Payer: Self-pay | Admitting: *Deleted

## 2017-04-03 NOTE — Telephone Encounter (Signed)
Patient insurance requiring 90-day supply

## 2017-04-04 ENCOUNTER — Other Ambulatory Visit: Payer: Self-pay | Admitting: Family Medicine

## 2017-04-04 MED ORDER — AMLODIPINE BESYLATE 10 MG PO TABS
10.0000 mg | ORAL_TABLET | Freq: Every day | ORAL | 2 refills | Status: DC
Start: 2017-04-04 — End: 2017-08-23

## 2017-08-23 ENCOUNTER — Other Ambulatory Visit: Payer: Self-pay

## 2017-08-23 ENCOUNTER — Ambulatory Visit (INDEPENDENT_AMBULATORY_CARE_PROVIDER_SITE_OTHER): Payer: BLUE CROSS/BLUE SHIELD | Admitting: Family Medicine

## 2017-08-23 ENCOUNTER — Encounter: Payer: Self-pay | Admitting: Family Medicine

## 2017-08-23 VITALS — BP 165/100 | HR 74 | Temp 98.3°F | Ht 75.0 in | Wt 336.8 lb

## 2017-08-23 DIAGNOSIS — I10 Essential (primary) hypertension: Secondary | ICD-10-CM

## 2017-08-23 DIAGNOSIS — E785 Hyperlipidemia, unspecified: Secondary | ICD-10-CM

## 2017-08-23 MED ORDER — AMLODIPINE BESYLATE 10 MG PO TABS: 10.0000 mg | ORAL_TABLET | Freq: Every day | ORAL | 2 refills | Status: DC

## 2017-08-23 NOTE — Patient Instructions (Signed)
Paul [name], your seen today for checkup and you still have high blood pressure.  I refilled your amlodipine.  Please take 1 pill daily and follow-up for a nursing visit to have your blood pressure checked in 2 weeks.   I am checking your lipid panel today to see your cholesterol is and will fill out those forms and have them faxed.  Very nice to see you today, Paul Giambra L. Myrtie SomanWarden, MD [facility] Resident PGY-2 08/23/2017 10:16 AM

## 2017-08-23 NOTE — Progress Notes (Signed)
    Subjective:  Paul [name] is a 32 y.o. male who [facility]  HPI:  Hypertension Blood pressure at home: does not take Blood pressure today: 160/95 Meds: non-compliant with amlodipine. Has not taken anything since august.  Side effects: none ROS: Denies headache, dizziness, visual changes, nausea, vomiting, chest pain, abdominal pain or shortness of breath.  Patient was last seen by Dr. Jimmey RalphParker in February 2019 at that point was started on amlodipine 10 mg daily and was recommended to follow-up in 2 weeks to have blood pressure checked.  He presents today and reports that the last time he took any medication was the summer and denies any of the above symptoms.   PMH: morbid obesity Tobacco use: non-smoker Medication: reviewed and updated ROS: see HPI   Objective:  Physical Exam: BP (!) 160/95 (BP Location: Right Arm, Patient Position: Sitting, Cuff Size: Large)   Pulse 74   Temp 98.3 F (36.8 C) (Oral)   Wt (!) 336 lb 12.8 oz (152.8 kg)   SpO2 99%   BMI 42.10 kg/m   Gen: 31yo M in NAD, resting comfortably CV: RRR with no murmurs appreciated Pulm: NWOB, CTAB with no crackles, wheezes, or rhonchi GI: Normal bowel sounds present. Soft, Nontender, Nondistended. MSK: no edema, cyanosis, or clubbing noted Skin: warm, dry Neuro: grossly normal, moves all extremities Psych: Normal affect and thought content  No results found for this or any previous visit (from the past 72 hour(s)).   Assessment/Plan:  Essential hypertension, benign Patient diagnosed with essential hypertension February 2018 started on amlodipine 10 mg and recommended to follow-up and unfortunately patient did not follow-up.  Has not taken any medication since this past summer.  Denies any red flags signs or symptoms.  Blood pressure today 160/95.  -Refilled amlodipine 10 mg daily -Recommended follow-up for nursing visit in 2 weeks for blood pressure check -Discussed return  precautions  Morbid obesity BMI 42.  Last LDL was 147 and total cholesterol was 219.  No additional risk factors for CAD or stroke.  Highly recommended initiating an exercise program and healthy eating.  Healthcare maintenance Patient is due for Tdap and flu shot today.  Patient denied both.  Flu shot postponed.  We will continue to encourage both at further visits.   Chetara Kropp L. Myrtie SomanWarden, MD [facility] Resident PGY-2 08/23/2017 10:25 AM

## 2017-08-23 NOTE — Assessment & Plan Note (Addendum)
Patient diagnosed with essential hypertension February 2018 started on amlodipine 10 mg and recommended to follow-up and unfortunately patient did not follow-up.  Has not taken any medication since this past summer.  Denies any red flags signs or symptoms.  Blood pressure today 160/95.  -Refilled amlodipine 10 mg daily -Recommended follow-up for nursing visit in 2 weeks for blood pressure check -Discussed return precautions

## 2017-08-24 LAB — LIPID PANEL
CHOL/HDL RATIO: 4.4 ratio (ref 0.0–5.0)
Cholesterol, Total: 205 mg/dL — ABNORMAL HIGH (ref 100–199)
HDL: 47 mg/dL (ref >39)
LDL CALC: 133 mg/dL — AB (ref 0–99)
TRIGLYCERIDES: 124 mg/dL (ref 0–149)
VLDL Cholesterol Cal: 25 mg/dL (ref 5–40)

## 2018-09-14 ENCOUNTER — Encounter: Payer: Self-pay | Admitting: Family Medicine

## 2018-10-04 ENCOUNTER — Encounter: Payer: Self-pay | Admitting: Family Medicine

## 2018-10-04 ENCOUNTER — Ambulatory Visit (INDEPENDENT_AMBULATORY_CARE_PROVIDER_SITE_OTHER): Payer: BLUE CROSS/BLUE SHIELD | Admitting: Family Medicine

## 2018-10-04 ENCOUNTER — Other Ambulatory Visit: Payer: Self-pay

## 2018-10-04 VITALS — BP 142/98 | HR 82 | Temp 98.8°F | Ht 75.0 in | Wt 332.0 lb

## 2018-10-04 DIAGNOSIS — Z0000 Encounter for general adult medical examination without abnormal findings: Secondary | ICD-10-CM

## 2018-10-04 DIAGNOSIS — I10 Essential (primary) hypertension: Secondary | ICD-10-CM

## 2018-10-04 LAB — POCT GLYCOSYLATED HEMOGLOBIN (HGB A1C): HEMOGLOBIN A1C: 5.1 % (ref 4.0–5.6)

## 2018-10-04 MED ORDER — AMLODIPINE BESYLATE 10 MG PO TABS: 10.0000 mg | ORAL_TABLET | Freq: Every day | ORAL | 2 refills | Status: DC

## 2018-10-04 NOTE — Progress Notes (Signed)
SUBJECTIVE:  Paul [name] is a 33 y.o. male presenting for his annual checkup. Patient reports that he has been doing well since his last office. 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 His weight is stable but he admit and recognize that he needs to lose weight. He ran out of his amlodipine in early January and has not taken it since. No other acute complaint today. Current Outpatient Medications  Medication Sig Dispense Refill  . amLODipine (NORVASC) 10 MG tablet Take 1 tablet (10 mg total) by mouth daily. 90 tablet 2   No current facility-administered medications for this visit.    Allergies: Patient has no known allergies.   ROS:  Feeling well. No dyspnea or chest pain on exertion. No abdominal pain, change in bowel habits, black or bloody stools. No urinary tract or prostatic symptoms. No neurological complaints.  OBJECTIVE:  The patient appears well, alert, oriented x 3, in no distress.  BP (!) 142/98   Pulse 82   Temp 98.8 F (37.1 C) (Oral)   Ht 6\' 3"  (1.905 m)   Wt (!) 332 lb (150.6 kg)   SpO2 100%   BMI 41.50 kg/m  ENT normal.  Neck supple. No adenopathy or thyromegaly. PERLA. Lungs are clear, good air entry, no wheezes, rhonchi or rales. S1 and S2 normal, no murmurs, regular rate and rhythm. Abdomen is soft without tenderness, guarding, mass or organomegaly. .  Extremities show no edema, normal peripheral pulses. Neurological is normal without focal findings.  ASSESSMENT:  33 yo male with HTN, HTN and morbid obesity who presentstoday for annual check up  #Hypertension, uncontrolled  Patient has been non adherent to his BP med for the past month. Not at goal today. Refill amlodipine 10 mg daily. Will recheck in two weeks to assess for need for additional agent.  #Hyperlipidemia Patient is not on statin therapy. Will repeat lipid panel today. Given age and co-morbidities patient could benefit for cholesterol lowering agent in addition  to therapeutic lifestyle changes, however this is a discussion that we will have after his lipid panel result. No ASCVD calculation given age.   #Obesity Patient will continue to work on therapeutic lifestyle change and decline nutrition consult. He was able to lose significant amount of weight in the past and would like to try that one more time. Will continue to monitor.  Lovena Neighbours, MD [facility], PGY-3

## 2018-10-04 NOTE — Patient Instructions (Addendum)
It was great seeing you today! We have addressed the following issues today  1. I will liver and kidney function as well as your hemoglobin and glucose.  2. Keep working on lifestyle changes for weight loss. 3. Stay active  If we did any lab work today, and the results require attention, either me or my nurse will get in touch with you. If everything is normal, you will get a letter in mail and a message via . If you don't hear from [place] in two weeks, please give [place] a call. Otherwise, we look forward to seeing you again at your next visit. If you have any questions or concerns before then, please call the clinic at [phone].  Please bring all your medications to every doctors visit  Sign up for My Chart to have easy access to your labs results, and communication with your Primary care physician. Please ask Front Desk for some assistance.   Please check-out at the front desk before leaving the clinic.    Take Care,   Dr. Sydnee Cabal

## 2018-10-05 LAB — CBC WITH DIFFERENTIAL
BASOS ABS: 0 10*3/uL (ref 0.0–0.2)
Basos: 1 %
EOS (ABSOLUTE): 0 10*3/uL (ref 0.0–0.4)
EOS: 1 %
Hematocrit: 50.2 % (ref 37.5–51.0)
Hemoglobin: 16.9 g/dL (ref 13.0–17.7)
IMMATURE GRANULOCYTES: 0 %
Immature Grans (Abs): 0 10*3/uL (ref 0.0–0.1)
LYMPHS ABS: 1.2 10*3/uL (ref 0.7–3.1)
Lymphs: 25 %
MCH: 27.5 pg (ref 26.6–33.0)
MCHC: 33.7 g/dL (ref 31.5–35.7)
MCV: 82 fL (ref 79–97)
MONOS ABS: 0.3 10*3/uL (ref 0.1–0.9)
Monocytes: 7 %
NEUTROS PCT: 66 %
Neutrophils Absolute: 3.3 10*3/uL (ref 1.4–7.0)
RBC: 6.14 x10E6/uL — AB (ref 4.14–5.80)
RDW: 14.6 % (ref 11.6–15.4)
WBC: 4.9 10*3/uL (ref 3.4–10.8)

## 2018-10-05 LAB — LIPID PANEL
CHOLESTEROL TOTAL: 175 mg/dL (ref 100–199)
Chol/HDL Ratio: 3.9 ratio (ref 0.0–5.0)
HDL: 45 mg/dL (ref >39)
LDL CALC: 114 mg/dL — AB (ref 0–99)
TRIGLYCERIDES: 82 mg/dL (ref 0–149)
VLDL Cholesterol Cal: 16 mg/dL (ref 5–40)

## 2018-10-05 LAB — COMPREHENSIVE METABOLIC PANEL
ALK PHOS: 84 IU/L (ref 39–117)
ALT: 25 IU/L (ref 0–44)
AST: 22 IU/L (ref 0–40)
Albumin/Globulin Ratio: 1.8 (ref 1.2–2.2)
Albumin: 4.5 g/dL (ref 4.0–5.0)
BUN/Creatinine Ratio: 9 (ref 9–20)
BUN: 9 mg/dL (ref 6–20)
Bilirubin Total: 0.5 mg/dL (ref 0.0–1.2)
CALCIUM: 8.7 mg/dL (ref 8.7–10.2)
CO2: 22 mmol/L (ref 20–29)
CREATININE: 1.02 mg/dL (ref 0.76–1.27)
Chloride: 103 mmol/L (ref 96–106)
GFR calc Af Amer: 113 mL/min/{1.73_m2} (ref >59)
GFR, EST NON AFRICAN AMERICAN: 97 mL/min/{1.73_m2} (ref >59)
GLOBULIN, TOTAL: 2.5 g/dL (ref 1.5–4.5)
GLUCOSE: 81 mg/dL (ref 65–99)
Potassium: 4.7 mmol/L (ref 3.5–5.2)
SODIUM: 141 mmol/L (ref 134–144)
Total Protein: 7 g/dL (ref 6.0–8.5)

## 2020-02-04 ENCOUNTER — Ambulatory Visit: Payer: BLUE CROSS/BLUE SHIELD | Admitting: Family Medicine

## 2020-02-04 ENCOUNTER — Other Ambulatory Visit: Payer: Self-pay

## 2020-02-04 ENCOUNTER — Ambulatory Visit (INDEPENDENT_AMBULATORY_CARE_PROVIDER_SITE_OTHER): Payer: BC Managed Care – PPO | Admitting: Family Medicine

## 2020-02-04 ENCOUNTER — Other Ambulatory Visit (HOSPITAL_COMMUNITY)
Admission: RE | Admit: 2020-02-04 | Discharge: 2020-02-04 | Disposition: A | Discharge status: Home / Self Care | Payer: BC Managed Care – PPO | Source: Ambulatory Visit | Attending: Family Medicine | Admitting: Family Medicine

## 2020-02-04 VITALS — BP 190/110 | HR 91 | Ht 75.0 in | Wt 368.0 lb

## 2020-02-04 DIAGNOSIS — Z202 Contact with and (suspected) exposure to infections with a predominantly sexual mode of transmission: Secondary | ICD-10-CM | POA: Insufficient documentation

## 2020-02-04 DIAGNOSIS — I10 Essential (primary) hypertension: Secondary | ICD-10-CM

## 2020-02-04 DIAGNOSIS — I160 Hypertensive urgency: Secondary | ICD-10-CM | POA: Diagnosis not present

## 2020-02-04 DIAGNOSIS — I169 Hypertensive crisis, unspecified: Secondary | ICD-10-CM | POA: Insufficient documentation

## 2020-02-04 DIAGNOSIS — Z1159 Encounter for screening for other viral diseases: Secondary | ICD-10-CM | POA: Insufficient documentation

## 2020-02-04 HISTORY — DX: Contact with and (suspected) exposure to infections with a predominantly sexual mode of transmission: Z20.2

## 2020-02-04 MED ORDER — AMLODIPINE BESYLATE 10 MG PO TABS: 10.0000 mg | ORAL_TABLET | Freq: Every day | ORAL | 2 refills | Status: DC

## 2020-02-04 NOTE — Assessment & Plan Note (Signed)
Hep C antibody obtained for screening purposes as patient has not had this yet.

## 2020-02-04 NOTE — Progress Notes (Signed)
    SUBJECTIVE:   CHIEF COMPLAINT / HPI:   STD Testing Patient reports that he would like to be screened for STD States that he has not had any symptoms States that partners have been male Is not aware of partners having any STDs  HTN Current Regimen: Norvasc 10 mg daily Patient reports that he has been out for at least a week Denies chest pain, shortness of breath, headache, vision changes, no leg swelling Last labs drawn on 10/04/2018 States that he has been trying to improve his diet in the last month Has lost weight, was 384 in March, now 368   PERTINENT  PMH / PSH: HTN, Left Facial Palsy  OBJECTIVE:   BP (!) 190/110   Pulse 91   Ht 6\' 3"  (1.905 m)   Wt (!) 368 lb (166.9 kg)   SpO2 98%   BMI 46.00 kg/m    BP 200/140 then improved to 190/110  Physical Exam:  General: 34 y.o. male in NAD HEENT: No papilledema on funduscopic examination OU, extraocular movements intact on left, OD does not look temporally, patient states this is chronic since a previous eye surgery Cardio: RRR no m/r/g Lungs: CTAB, no wheezing, no rhonchi, no crackles, no IWOB on RA Skin: warm and dry Extremities: No edema Neuro: Cranial nerves II through XII grossly intact aside from left-sided facial palsy and ocular involvement listed above, sensation intact throughout bilateral upper and lower extremities, 5/5 strength BUE/BLE   ASSESSMENT/PLAN:   Hypertensive urgency Asymptomatic hypertensive urgency.  Patient is neurologically intact on exam aside from his chronic deficits.  Will restart Norvasc today.  Patient states he is not able to come in the rest of the week for a follow-up visit.  We will schedule him for first available in the afternoon of Monday, June 21 at 1:50 PM at his request.  ED precautions discussed with patient, see AVS.  Will also obtain BMP today as he has not had one since February 2020.  Exposure to STD Urine gonorrhea/chlamydia obtained.  HIV/RPR also  obtained.  Encounter for hepatitis C screening test for low risk patient Hep C antibody obtained for screening purposes as patient has not had this yet.     March 2020, DO [facility]

## 2020-02-04 NOTE — Assessment & Plan Note (Signed)
Urine gonorrhea/chlamydia obtained.  HIV/RPR also obtained.

## 2020-02-04 NOTE — Patient Instructions (Signed)
Thank you for coming to see me today. It was a pleasure. Today we talked about:   Get a blood pressure cuff at [facility] or the pharmacy.  Start taking your norvasc today.  We will get some labs today.  If they are abnormal or we need to do something about them, I will call you.  If they are normal, I will send you a message on MyChart (if it is active) or a letter in the mail.  If you don't hear from [place] in 2 weeks, please call the office at the number below.  If you start having chest pain, difficulty breathing, headache, changes in vision, weakness on one side of your body or the other, changes in sensation you should be seen right away.  Please follow-up Monday 6/21 at 1:50pm.  If you have any questions or concerns, please do not hesitate to call the office at [phone].  Best,   Luis Abed, DO

## 2020-02-04 NOTE — Assessment & Plan Note (Signed)
Asymptomatic hypertensive urgency.  Patient is neurologically intact on exam aside from his chronic deficits.  Will restart Norvasc today.  Patient states he is not able to come in the rest of the week for a follow-up visit.  We will schedule him for first available in the afternoon of Monday, June 21 at 1:50 PM at his request.  ED precautions discussed with patient, see AVS.  Will also obtain BMP today as he has not had one since February 2020.

## 2020-02-05 LAB — URINE CYTOLOGY ANCILLARY ONLY
Chlamydia: NEGATIVE
Comment: NEGATIVE
Comment: NORMAL
Neisseria Gonorrhea: NEGATIVE

## 2020-02-05 LAB — BASIC METABOLIC PANEL
BUN/Creatinine Ratio: 5 — ABNORMAL LOW (ref 9–20)
BUN: 7 mg/dL (ref 6–20)
CO2: 22 mmol/L (ref 20–29)
Calcium: 9.6 mg/dL (ref 8.7–10.2)
Chloride: 103 mmol/L (ref 96–106)
Creatinine, Ser: 1.32 mg/dL — ABNORMAL HIGH (ref 0.76–1.27)
GFR calc Af Amer: 81 mL/min/{1.73_m2} (ref >59)
GFR calc non Af Amer: 70 mL/min/{1.73_m2} (ref >59)
Glucose: 80 mg/dL (ref 65–99)
Potassium: 4.4 mmol/L (ref 3.5–5.2)
Sodium: 143 mmol/L (ref 134–144)

## 2020-02-05 LAB — HIV ANTIBODY (ROUTINE TESTING W REFLEX): HIV Screen 4th Generation wRfx: NONREACTIVE

## 2020-02-05 LAB — RPR: RPR Ser Ql: NONREACTIVE

## 2020-02-05 LAB — HEPATITIS C ANTIBODY: Hep C Virus Ab: <0.1 s/co ratio (ref 0.0–0.9)

## 2020-02-10 ENCOUNTER — Ambulatory Visit: Payer: BC Managed Care – PPO

## 2020-02-20 ENCOUNTER — Ambulatory Visit: Payer: BC Managed Care – PPO | Admitting: Family Medicine

## 2020-10-15 ENCOUNTER — Other Ambulatory Visit: Payer: Self-pay

## 2020-10-15 ENCOUNTER — Ambulatory Visit: Payer: BC Managed Care – PPO | Admitting: Family Medicine

## 2020-10-15 ENCOUNTER — Encounter: Payer: Self-pay | Admitting: Family Medicine

## 2020-10-15 VITALS — BP 236/146 | HR 86 | Ht 75.0 in | Wt 345.3 lb

## 2020-10-15 DIAGNOSIS — Z131 Encounter for screening for diabetes mellitus: Secondary | ICD-10-CM | POA: Diagnosis not present

## 2020-10-15 DIAGNOSIS — E7800 Pure hypercholesterolemia, unspecified: Secondary | ICD-10-CM | POA: Insufficient documentation

## 2020-10-15 DIAGNOSIS — I10 Essential (primary) hypertension: Secondary | ICD-10-CM | POA: Diagnosis not present

## 2020-10-15 LAB — POCT GLYCOSYLATED HEMOGLOBIN (HGB A1C): Hemoglobin A1C: 5.2 % (ref 4.0–5.6)

## 2020-10-15 MED ORDER — AMLODIPINE BESYLATE 10 MG PO TABS: 10.0000 mg | ORAL_TABLET | Freq: Every day | ORAL | 3 refills | Status: DC

## 2020-10-15 NOTE — Assessment & Plan Note (Signed)
Patient with history of elevated LDL.  -Recheck lipid panel

## 2020-10-15 NOTE — Assessment & Plan Note (Signed)
Patient with BMI of 43, is losing weight with keto diet.  Has lost 23 pounds since June 2021 visit. -We will screen today for diabetes with A1c

## 2020-10-15 NOTE — Assessment & Plan Note (Signed)
Blood pressure today on presentation 236/146.  On repeat was 160/100.  Patient received clonidine 0.1 mg p.o.  Before patient last blood pressure was checked again and read 218 systolic.  Patient without headache, vision changes, chest pain, shortness of breath.  He is prescribed amlodipine 10 mg but reports he has not taken this since the new year (about 1.5 months). -Represcribed patient's amlodipine 10 mg daily and encouraged him to take this medication daily -Plan to follow-up with patient in 1 week -Also checking BMP as patient had elevated creatinine in June/to monitor kidneys with such elevated blood pressure

## 2020-10-15 NOTE — Progress Notes (Signed)
    SUBJECTIVE:   CHIEF COMPLAINT / HPI:   HTN: Patient presents to clinic today with a blood pressure of 236/146.  On repeat manual check blood pressure improved at 160/100.  Patient was given clonidine 0.1 mg p.o.  Patient denies having headache, chest pain, shortness of breath.  His blood pressure was checked again before leaving the office and it was 218 systolic.  Patient previously prescribed amlodipine 10 mg per reports that he has been out since January (since the new year).  The patient does not smoke.  Reports his mom has a history of high blood pressure, he is not sure what she is taking.  Health maintenance: due for diabetes screening, lipid panel screen, Tdap, Flu, COVID Vaccine  PERTINENT  PMH / PSH:  Patient Active Problem List   Diagnosis Date Noted  . Elevated LDL cholesterol level 10/15/2020  . Diabetes mellitus screening 10/15/2020  . Hypertensive urgency 02/04/2020  . Encounter for hepatitis C screening test for low risk patient 02/04/2020  . Obesity 04/16/2014  . Essential hypertension, benign 04/15/2014  . 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  On repeat was 160/100.  Patient received clonidine 0.1 mg p.o.  Before patient last blood pressure was checked again and read 218 systolic.  Patient without headache, vision changes, chest pain, shortness of breath.  He is prescribed amlodipine 10 mg but reports he has not taken this since the new year (about 1.5 months). -Represcribed patient's amlodipine 10 mg daily and encouraged him to take this  medication daily -Plan to follow-up with patient in 1 week -Also checking BMP as patient had elevated creatinine in June/to monitor kidneys with such elevated blood pressure  Diabetes mellitus screening Patient with BMI of 43, is losing weight with keto diet.  Has lost 23 pounds since June 2021 visit. -We will screen today for diabetes with A1c  Elevated LDL cholesterol level Patient with history of elevated LDL.  -Recheck lipid panel     July 2021, DO [facility]

## 2020-10-15 NOTE — Patient Instructions (Addendum)
Thank you for coming in to see [place] today! Please see below to review our plan for today's visit:  1. Take Amlodipine 10mg  daily. 2. We are checking your electrolytes, kidney function, cholesterol, and A1c (a marker for diabetes).  3. Follow up in 1 week to repeat BP and go over labs.   Please call the clinic at [phone] if your symptoms worsen or you have any concerns. It was our pleasure to serve you!   Dr. [phone] [facility]   DASH Eating Plan DASH stands for Dietary Approaches to Stop Hypertension. The DASH eating plan is a healthy eating plan that has been shown to:  Reduce high blood pressure (hypertension).  Reduce your risk for type 2 diabetes, heart disease, and stroke.  Help with weight loss. What are tips for following this plan? Reading food labels  Check food labels for the amount of salt (sodium) per serving. Choose foods with less than 5 percent of the Daily Value of sodium. Generally, foods with less than 300 milligrams (mg) of sodium per serving fit into this eating plan.  To find whole grains, look for the word "whole" as the first word in the ingredient list. Shopping  Buy products labeled as "low-sodium" or "no salt added."  Buy fresh foods. Avoid canned foods and pre-made or frozen meals. Cooking  Avoid adding salt when cooking. Use salt-free seasonings or herbs instead of table salt or sea salt. Check with your health care provider or pharmacist before using salt substitutes.  Do not fry foods. Cook foods using healthy methods such as baking, boiling, grilling, roasting, and broiling instead.  Cook with heart-healthy oils, such as olive, canola, avocado, soybean, or sunflower oil. Meal planning  Eat a balanced diet that includes: ? 4 or more servings of fruits and 4 or more servings of vegetables each day. Try to fill one-half of your plate with fruits and vegetables. ? 6-8 servings of whole grains each day. ? Less than 6  oz (170 g) of lean meat, poultry, or fish each day. A 3-oz (85-g) serving of meat is about the same size as a deck of cards. One egg equals 1 oz (28 g). ? 2-3 servings of low-fat dairy each day. One serving is 1 cup (237 mL). ? 1 serving of nuts, seeds, or beans 5 times each week. ? 2-3 servings of heart-healthy fats. Healthy fats called omega-3 fatty acids are found in foods such as walnuts, flaxseeds, fortified milks, and eggs. These fats are also found in cold-water fish, such as sardines, salmon, and mackerel.  Limit how much you eat of: ? Canned or prepackaged foods. ? Food that is high in trans fat, such as some fried foods. ? Food that is high in saturated fat, such as fatty meat. ? Desserts and other sweets, sugary drinks, and other foods with added sugar. ? Full-fat dairy products.  Do not salt foods before eating.  Do not eat more than 4 egg yolks a week.  Try to eat at least 2 vegetarian meals a week.  Eat more home-cooked food and less restaurant, buffet, and fast food.   Lifestyle  When eating at a restaurant, ask that your food be prepared with less salt or no salt, if possible.  If you drink alcohol: ? Limit how much you use to:  0-1 drink a day for women who are not pregnant.  0-2 drinks a day for men. ? Be aware of how much alcohol is in  your drink. In the [place] one drink equals one 12 oz bottle of beer (355 mL), one 5 oz glass of wine (148 mL), or one 1 oz glass of hard liquor (44 mL). General information  Avoid eating more than 2,300 mg of salt a day. If you have hypertension, you may need to reduce your sodium intake to 1,500 mg a day.  Work with your health care provider to maintain a healthy body weight or to lose weight. Ask what an ideal weight is for you.  Get at least 30 minutes of exercise that causes your heart to beat faster (aerobic exercise) most days of the week. Activities may include walking, swimming, or biking.  Work with your health care  provider or dietitian to adjust your eating plan to your individual calorie needs. What foods should I eat? Fruits All fresh, dried, or frozen fruit. Canned fruit in natural juice (without added sugar). Vegetables Fresh or frozen vegetables (raw, steamed, roasted, or grilled). Low-sodium or reduced-sodium tomato and vegetable juice. Low-sodium or reduced-sodium tomato sauce and tomato paste. Low-sodium or reduced-sodium canned vegetables. Grains Whole-grain or whole-wheat bread. Whole-grain or whole-wheat pasta. Brown rice. Orpah Cobb. Bulgur. Whole-grain and low-sodium cereals. Pita bread. Low-fat, low-sodium crackers. Whole-wheat flour tortillas. Meats and other proteins Skinless chicken or [place]. Ground chicken or [place]. Pork with fat trimmed off. Fish and seafood. Egg whites. Dried beans, peas, or lentils. Unsalted nuts, nut butters, and seeds. Unsalted canned beans. Lean cuts of beef with fat trimmed off. Low-sodium, lean precooked or cured meat, such as sausages or meat loaves. Dairy Low-fat (1%) or fat-free (skim) milk. Reduced-fat, low-fat, or fat-free cheeses. Nonfat, low-sodium ricotta or cottage cheese. Low-fat or nonfat yogurt. Low-fat, low-sodium cheese. Fats and oils Soft margarine without trans fats. Vegetable oil. Reduced-fat, low-fat, or light mayonnaise and salad dressings (reduced-sodium). Canola, safflower, olive, avocado, soybean, and sunflower oils. Avocado. Seasonings and condiments Herbs. Spices. Seasoning mixes without salt. Other foods Unsalted popcorn and pretzels. Fat-free sweets. The items listed above may not be a complete list of foods and beverages you can eat. Contact a dietitian for more information. What foods should I avoid? Fruits Canned fruit in a light or heavy syrup. Fried fruit. Fruit in cream or butter sauce. Vegetables Creamed or fried vegetables. Vegetables in a cheese sauce. Regular canned vegetables (not low-sodium or reduced-sodium).  Regular canned tomato sauce and paste (not low-sodium or reduced-sodium). Regular tomato and vegetable juice (not low-sodium or reduced-sodium). Rosita Fire. Olives. Grains Baked goods made with fat, such as croissants, muffins, or some breads. Dry pasta or rice meal packs. Meats and other proteins Fatty cuts of meat. Ribs. Fried meat. Tomasa Blase. Bologna, salami, and other precooked or cured meats, such as sausages or meat loaves. Fat from the back of a pig (fatback). Bratwurst. Salted nuts and seeds. Canned beans with added salt. Canned or smoked fish. Whole eggs or egg yolks. Chicken or [place] with skin. Dairy Whole or 2% milk, cream, and half-and-half. Whole or full-fat cream cheese. Whole-fat or sweetened yogurt. Full-fat cheese. Nondairy creamers. Whipped toppings. Processed cheese and cheese spreads. Fats and oils Butter. Stick margarine. Lard. Shortening. Ghee. Bacon fat. Tropical oils, such as coconut, palm kernel, or palm oil. Seasonings and condiments Onion salt, garlic salt, seasoned salt, table salt, and sea salt. Worcestershire sauce. Tartar sauce. Barbecue sauce. Teriyaki sauce. Soy sauce, including reduced-sodium. Steak sauce. Canned and packaged gravies. Fish sauce. Oyster sauce. Cocktail sauce. Store-bought horseradish. Ketchup. Mustard. Meat flavorings and tenderizers. Bouillon cubes. Hot sauces.  Pre-made or packaged marinades. Pre-made or packaged taco seasonings. Relishes. Regular salad dressings. Other foods Salted popcorn and pretzels. The items listed above may not be a complete list of foods and beverages you should avoid. Contact a dietitian for more information. Where to find more information  National Heart, Lung, and Blood Institute: PopSteam.is  American Heart Association: www.heart.org  Academy of Nutrition and Dietetics: www.eatright.org  National Kidney Foundation: www.kidney.org Summary  The DASH eating plan is a healthy eating plan that has been shown to  reduce high blood pressure (hypertension). It may also reduce your risk for type 2 diabetes, heart disease, and stroke.  When on the DASH eating plan, aim to eat more fresh fruits and vegetables, whole grains, lean proteins, low-fat dairy, and heart-healthy fats.  With the DASH eating plan, you should limit salt (sodium) intake to 2,300 mg a day. If you have hypertension, you may need to reduce your sodium intake to 1,500 mg a day.  Work with your health care provider or dietitian to adjust your eating plan to your individual calorie needs. This information is not intended to replace advice given to you by your health care provider. Make sure you discuss any questions you have with your health care provider. Document Revised: 07/12/2019 Document Reviewed: 07/12/2019 Elsevier Patient Education  2021 [employer].

## 2020-10-16 LAB — BASIC METABOLIC PANEL
BUN/Creatinine Ratio: 10 (ref 9–20)
BUN: 14 mg/dL (ref 6–20)
CO2: 17 mmol/L — ABNORMAL LOW (ref 20–29)
Calcium: 9.8 mg/dL (ref 8.7–10.2)
Chloride: 103 mmol/L (ref 96–106)
Creatinine, Ser: 1.36 mg/dL — ABNORMAL HIGH (ref 0.76–1.27)
GFR calc Af Amer: 78 mL/min/{1.73_m2} (ref >59)
GFR calc non Af Amer: 67 mL/min/{1.73_m2} (ref >59)
Glucose: 81 mg/dL (ref 65–99)
Potassium: 4.9 mmol/L (ref 3.5–5.2)
Sodium: 142 mmol/L (ref 134–144)

## 2020-10-16 LAB — LIPID PANEL
Chol/HDL Ratio: 4.7 ratio (ref 0.0–5.0)
Cholesterol, Total: 177 mg/dL (ref 100–199)
HDL: 38 mg/dL — ABNORMAL LOW (ref >39)
LDL Chol Calc (NIH): 125 mg/dL — ABNORMAL HIGH (ref 0–99)
Triglycerides: 72 mg/dL (ref 0–149)
VLDL Cholesterol Cal: 14 mg/dL (ref 5–40)

## 2020-10-23 ENCOUNTER — Ambulatory Visit: Payer: BC Managed Care – PPO | Admitting: Family Medicine

## 2020-10-26 ENCOUNTER — Telehealth: Payer: Self-pay

## 2020-10-26 NOTE — Telephone Encounter (Signed)
Patient's wife calls nurse line regarding employment physical paperwork. Wife reports that patient was seen in the office on 2/24 and paperwork was provided to PCP for completion. Wife states they were waiting on lab results to be able to complete paperwork and fax back to employer.    Paperwork was due by 2/28. Unable to find in previously faxed files.   Please advise update on paperwork.   To PCP  Veronda Prude, RN

## 2020-10-30 ENCOUNTER — Ambulatory Visit (INDEPENDENT_AMBULATORY_CARE_PROVIDER_SITE_OTHER): Payer: BC Managed Care – PPO | Admitting: Family Medicine

## 2020-10-30 DIAGNOSIS — Z91199 Patient's noncompliance with other medical treatment and regimen due to unspecified reason: Secondary | ICD-10-CM | POA: Insufficient documentation

## 2020-10-30 DIAGNOSIS — Z5329 Procedure and treatment not carried out because of patient's decision for other reasons: Secondary | ICD-10-CM

## 2020-10-30 DIAGNOSIS — I10 Essential (primary) hypertension: Secondary | ICD-10-CM

## 2020-10-30 NOTE — Progress Notes (Signed)
Patient was scheduled 10/30/2020 to follow-up for his blood pressure, patient did not show up.  Would like to order BMP.  Patient would also benefit from Tdap vaccine.   Peggyann Shoals, DO [facility], PGY-3 10/30/2020 2:27 PM

## 2021-11-06 ENCOUNTER — Inpatient Hospital Stay (HOSPITAL_COMMUNITY)
Admission: EM | Admit: 2021-11-06 | Discharge: 2021-11-13 | DRG: 064 | Disposition: A | Discharge status: Rehabilitation Facility | Payer: Medicaid Other | Attending: Family Medicine | Admitting: Family Medicine

## 2021-11-06 ENCOUNTER — Encounter (HOSPITAL_COMMUNITY): Payer: Self-pay | Admitting: Emergency Medicine

## 2021-11-06 ENCOUNTER — Emergency Department (HOSPITAL_COMMUNITY): Payer: Medicaid Other

## 2021-11-06 DIAGNOSIS — R270 Ataxia, unspecified: Secondary | ICD-10-CM | POA: Diagnosis present

## 2021-11-06 DIAGNOSIS — N1831 Chronic kidney disease, stage 3a: Secondary | ICD-10-CM | POA: Diagnosis not present

## 2021-11-06 DIAGNOSIS — R29810 Facial weakness: Secondary | ICD-10-CM | POA: Diagnosis present

## 2021-11-06 DIAGNOSIS — I69391 Dysphagia following cerebral infarction: Secondary | ICD-10-CM

## 2021-11-06 DIAGNOSIS — J9601 Acute respiratory failure with hypoxia: Secondary | ICD-10-CM | POA: Diagnosis present

## 2021-11-06 DIAGNOSIS — K117 Disturbances of salivary secretion: Secondary | ICD-10-CM | POA: Diagnosis present

## 2021-11-06 DIAGNOSIS — E785 Hyperlipidemia, unspecified: Secondary | ICD-10-CM | POA: Diagnosis present

## 2021-11-06 DIAGNOSIS — I639 Cerebral infarction, unspecified: Secondary | ICD-10-CM | POA: Diagnosis not present

## 2021-11-06 DIAGNOSIS — R1310 Dysphagia, unspecified: Secondary | ICD-10-CM | POA: Diagnosis not present

## 2021-11-06 DIAGNOSIS — I160 Hypertensive urgency: Secondary | ICD-10-CM | POA: Diagnosis not present

## 2021-11-06 DIAGNOSIS — Z982 Presence of cerebrospinal fluid drainage device: Secondary | ICD-10-CM

## 2021-11-06 DIAGNOSIS — Z87820 Personal history of traumatic brain injury: Secondary | ICD-10-CM

## 2021-11-06 DIAGNOSIS — Z6838 Body mass index (BMI) 38.0-38.9, adult: Secondary | ICD-10-CM | POA: Diagnosis not present

## 2021-11-06 DIAGNOSIS — Z9889 Other specified postprocedural states: Secondary | ICD-10-CM

## 2021-11-06 DIAGNOSIS — R0981 Nasal congestion: Secondary | ICD-10-CM | POA: Diagnosis not present

## 2021-11-06 DIAGNOSIS — J810 Acute pulmonary edema: Secondary | ICD-10-CM | POA: Diagnosis present

## 2021-11-06 DIAGNOSIS — N179 Acute kidney failure, unspecified: Secondary | ICD-10-CM | POA: Diagnosis present

## 2021-11-06 DIAGNOSIS — D751 Secondary polycythemia: Secondary | ICD-10-CM | POA: Diagnosis present

## 2021-11-06 DIAGNOSIS — I129 Hypertensive chronic kidney disease with stage 1 through stage 4 chronic kidney disease, or unspecified chronic kidney disease: Secondary | ICD-10-CM | POA: Diagnosis not present

## 2021-11-06 DIAGNOSIS — I610 Nontraumatic intracerebral hemorrhage in hemisphere, subcortical: Principal | ICD-10-CM | POA: Diagnosis present

## 2021-11-06 DIAGNOSIS — I619 Nontraumatic intracerebral hemorrhage, unspecified: Secondary | ICD-10-CM | POA: Diagnosis not present

## 2021-11-06 DIAGNOSIS — R471 Dysarthria and anarthria: Secondary | ICD-10-CM | POA: Diagnosis not present

## 2021-11-06 DIAGNOSIS — I161 Hypertensive emergency: Secondary | ICD-10-CM | POA: Diagnosis not present

## 2021-11-06 DIAGNOSIS — Z713 Dietary counseling and surveillance: Secondary | ICD-10-CM

## 2021-11-06 DIAGNOSIS — I169 Hypertensive crisis, unspecified: Secondary | ICD-10-CM

## 2021-11-06 DIAGNOSIS — R338 Other retention of urine: Secondary | ICD-10-CM

## 2021-11-06 DIAGNOSIS — Z8249 Family history of ischemic heart disease and other diseases of the circulatory system: Secondary | ICD-10-CM

## 2021-11-06 DIAGNOSIS — H4911 Fourth [trochlear] nerve palsy, right eye: Secondary | ICD-10-CM | POA: Diagnosis not present

## 2021-11-06 DIAGNOSIS — R4701 Aphasia: Secondary | ICD-10-CM | POA: Diagnosis not present

## 2021-11-06 DIAGNOSIS — E1122 Type 2 diabetes mellitus with diabetic chronic kidney disease: Secondary | ICD-10-CM | POA: Diagnosis not present

## 2021-11-06 DIAGNOSIS — G4733 Obstructive sleep apnea (adult) (pediatric): Secondary | ICD-10-CM | POA: Diagnosis not present

## 2021-11-06 DIAGNOSIS — I6389 Other cerebral infarction: Secondary | ICD-10-CM | POA: Diagnosis not present

## 2021-11-06 DIAGNOSIS — Z20822 Contact with and (suspected) exposure to covid-19: Secondary | ICD-10-CM | POA: Diagnosis present

## 2021-11-06 DIAGNOSIS — I69251 Hemiplegia and hemiparesis following other nontraumatic intracranial hemorrhage affecting right dominant side: Secondary | ICD-10-CM | POA: Diagnosis not present

## 2021-11-06 DIAGNOSIS — K5900 Constipation, unspecified: Secondary | ICD-10-CM | POA: Diagnosis not present

## 2021-11-06 DIAGNOSIS — R931 Abnormal findings on diagnostic imaging of heart and coronary circulation: Secondary | ICD-10-CM | POA: Diagnosis not present

## 2021-11-06 DIAGNOSIS — Z79899 Other long term (current) drug therapy: Secondary | ICD-10-CM

## 2021-11-06 DIAGNOSIS — Z9114 Patient's other noncompliance with medication regimen: Secondary | ICD-10-CM

## 2021-11-06 DIAGNOSIS — R29710 NIHSS score 10: Secondary | ICD-10-CM | POA: Diagnosis present

## 2021-11-06 DIAGNOSIS — E669 Obesity, unspecified: Secondary | ICD-10-CM | POA: Diagnosis present

## 2021-11-06 DIAGNOSIS — Z202 Contact with and (suspected) exposure to infections with a predominantly sexual mode of transmission: Secondary | ICD-10-CM | POA: Diagnosis present

## 2021-11-06 DIAGNOSIS — G8191 Hemiplegia, unspecified affecting right dominant side: Secondary | ICD-10-CM | POA: Diagnosis not present

## 2021-11-06 DIAGNOSIS — R1312 Dysphagia, oropharyngeal phase: Secondary | ICD-10-CM | POA: Diagnosis not present

## 2021-11-06 DIAGNOSIS — R339 Retention of urine, unspecified: Secondary | ICD-10-CM | POA: Diagnosis not present

## 2021-11-06 HISTORY — DX: Personal history of (healed) traumatic fracture: Z87.81

## 2021-11-06 LAB — I-STAT CHEM 8, ED
BUN: 17 mg/dL (ref 6–20)
Calcium, Ion: 0.95 mmol/L — ABNORMAL LOW (ref 1.15–1.40)
Chloride: 105 mmol/L (ref 98–111)
Creatinine, Ser: 1.4 mg/dL — ABNORMAL HIGH (ref 0.61–1.24)
Glucose, Bld: 100 mg/dL — ABNORMAL HIGH (ref 70–99)
HCT: 57 % — ABNORMAL HIGH (ref 39.0–52.0)
Hemoglobin: 19.4 g/dL — ABNORMAL HIGH (ref 13.0–17.0)
Potassium: 4.2 mmol/L (ref 3.5–5.1)
Sodium: 138 mmol/L (ref 135–145)
TCO2: 27 mmol/L (ref 22–32)

## 2021-11-06 IMAGING — CT CT HEAD CODE STROKE
3 series · 15 of 47 positions shown, 18 images · non-contrast
Comparison: [DATE]

CLINICAL DATA: Code stroke.  Aphasia



[Series 2: head 5.0 st · axial · 0.43mm/px · z∈[-154,-19]mm · 9 of 33 slices shown, 12 images]
[im 3/33  brain]
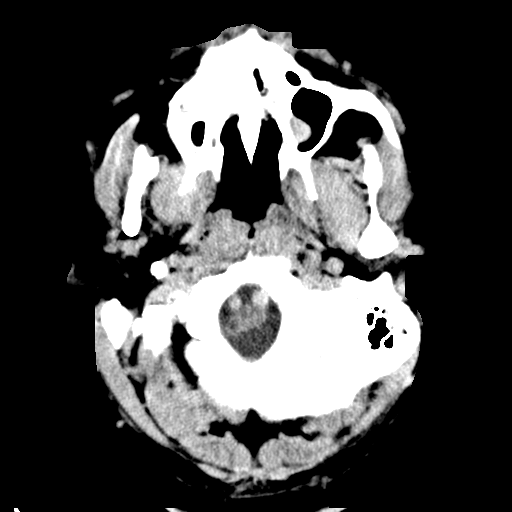
[im 3/33  bone]
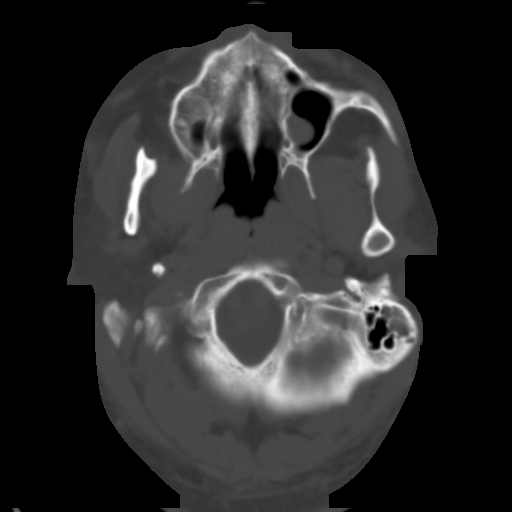
[im 6/33  brain]
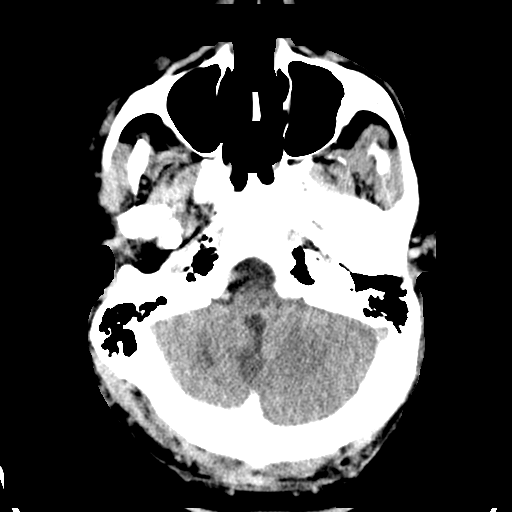
[im 9/33  brain]
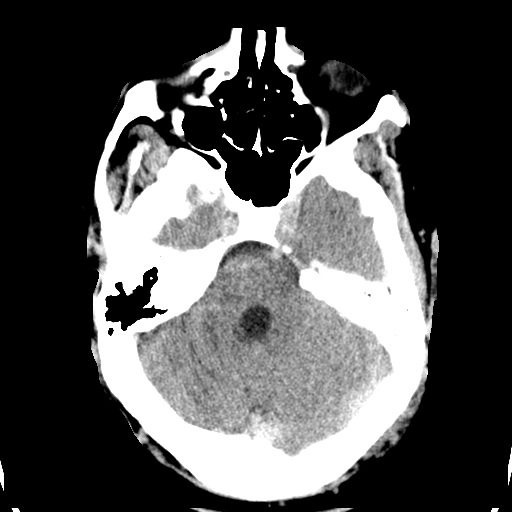
[im 13/33  brain]
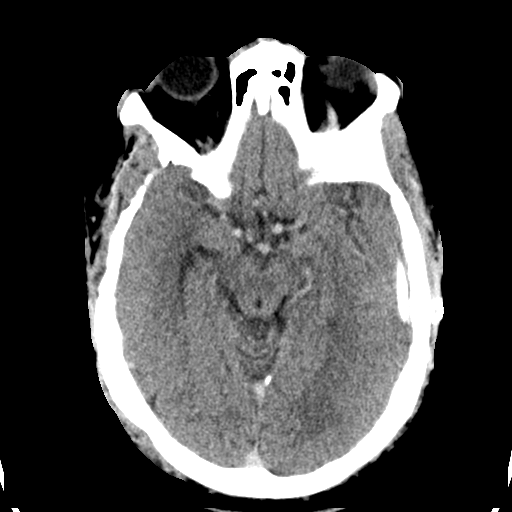
[im 17/33  brain]
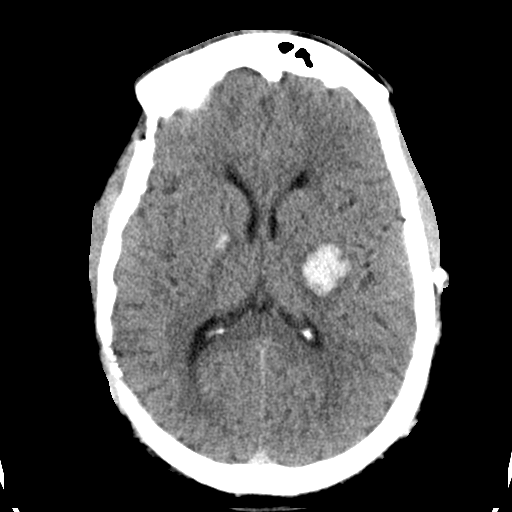
[im 17/33  bone]
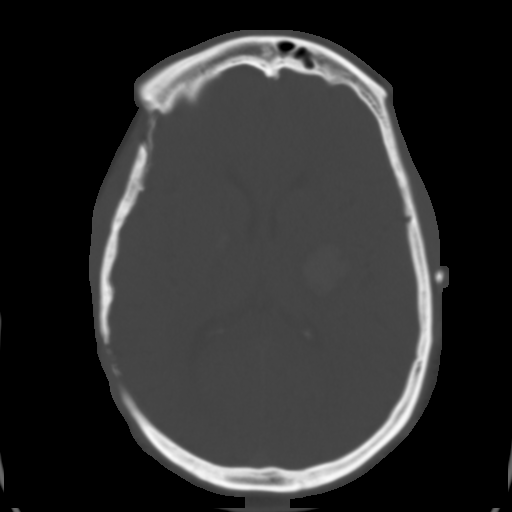
[im 20/33  brain]
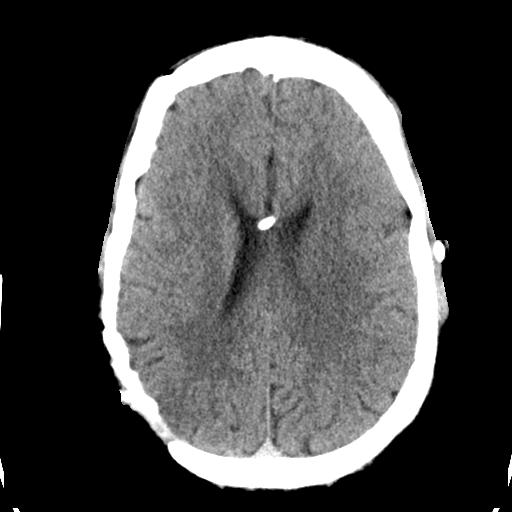
[im 24/33  brain]
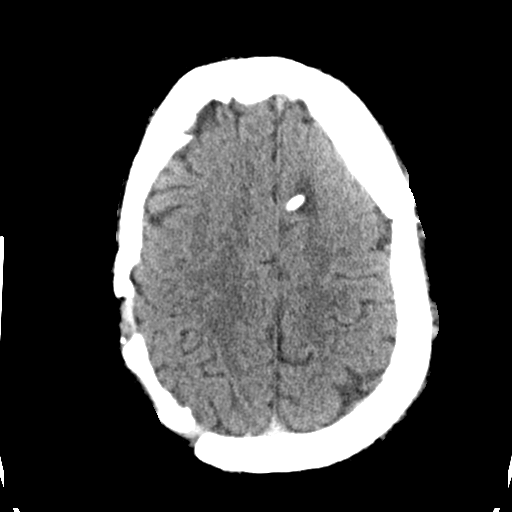
[im 27/33  brain]
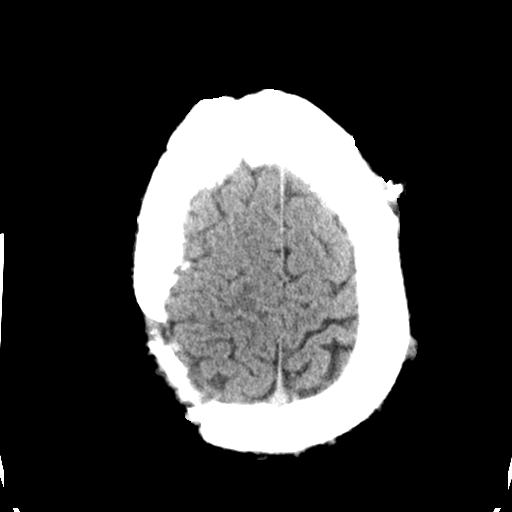
[im 30/33  brain]
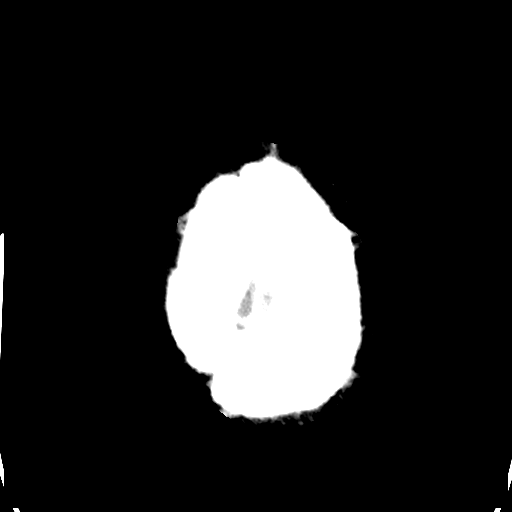
[im 30/33  bone]
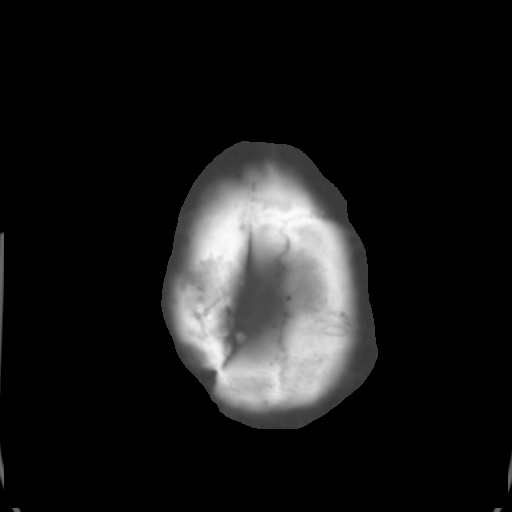

[Series 4: head 3.0 cor st · coronal · 0.30mm/px · 3 of 72 slices shown]
[im 24/72  brain]
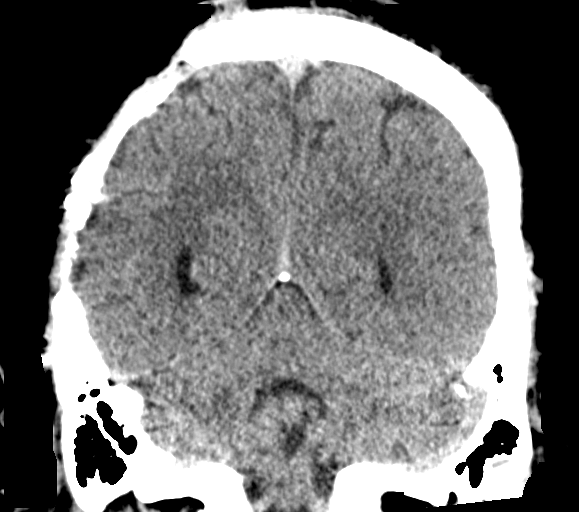
[im 32/72  brain]
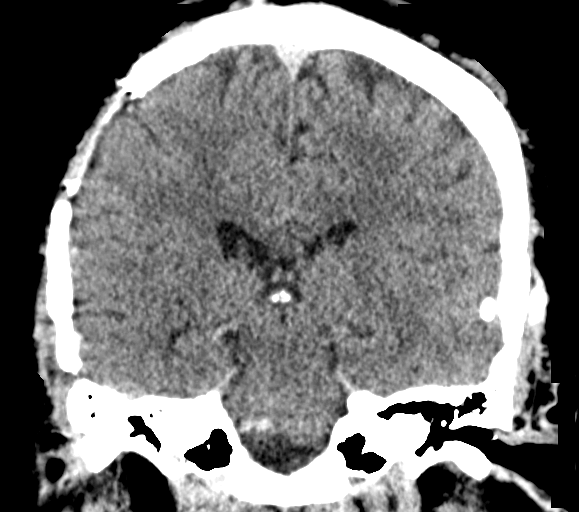
[im 40/72  brain]
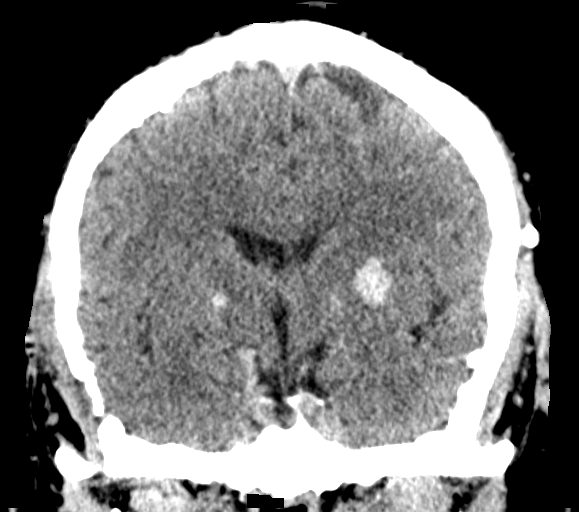

[Series 5: head 3.0 sag st · sagittal · 0.32mm/px · 3 of 58 slices shown]
[im 20/58  brain]
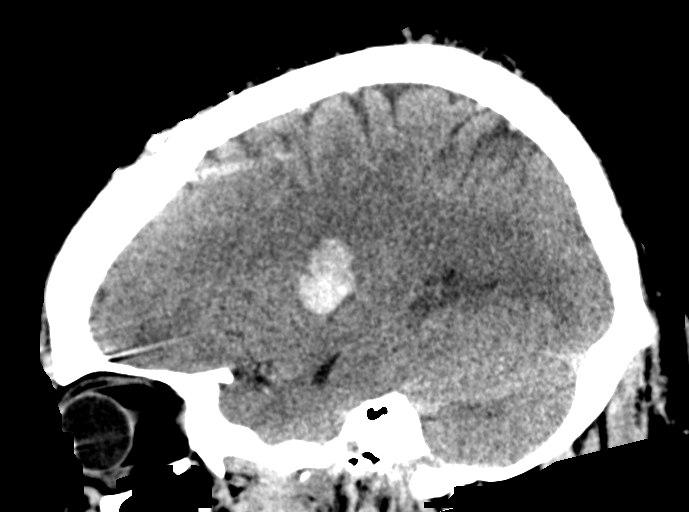
[im 29/58  brain]
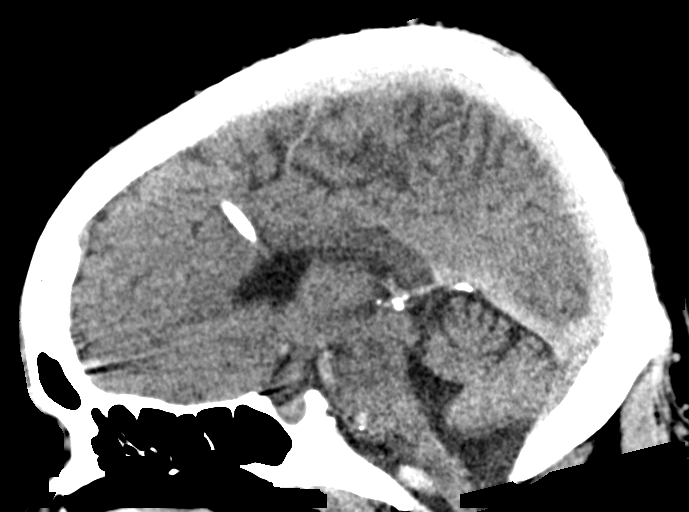
[im 39/58  brain]
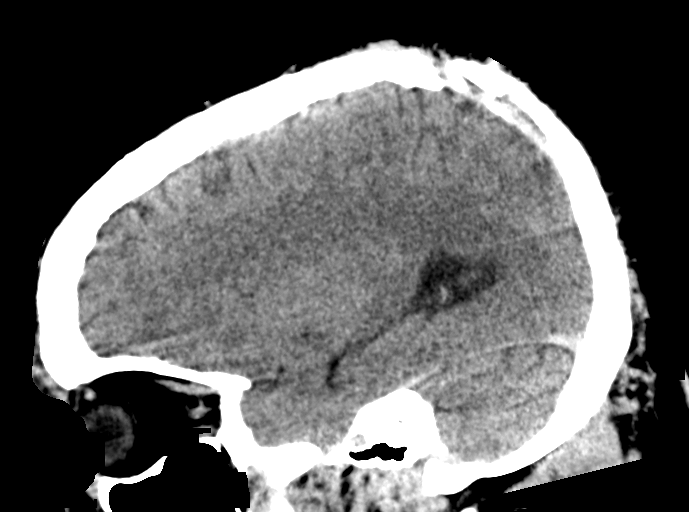

[15 of 47 positions shown; findings below may reference images not displayed]

FINDINGS: Brain: Acute intraparenchymal hemorrhage in the left basal ganglia
measures 2.0 x 2.0 x 2.0 cm. There is a left frontal approach
ventriculostomy catheter with tip in the right lateral ventricle. No
hydrocephalus. No midline shift or other mass effect.

Vascular: No abnormal hyperdensity of the major intracranial
arteries or dural venous sinuses. No intracranial atherosclerosis.

Skull: Remote right posterior craniotomy

Sinuses/Orbits: Small amount of left mastoid fluid. The orbits are
normal.
IMPRESSION: 1. Acute intraparenchymal hemorrhage in the left basal ganglia.
2. Left frontal approach ventriculostomy catheter with tip in the
right lateral ventricle. No hydrocephalus.
3. Critical Value/emergent results were called by telephone at the
time of interpretation on [DATE] at [DATE] to provider BAYO
BAYO, who verbally acknowledged these results.

## 2021-11-06 MED ORDER — ACETAMINOPHEN 160 MG/5ML PO SOLN
650.0000 mg | ORAL | Status: DC | PRN
  Administered 2021-11-10: 650 mg
  Filled 2021-11-06: qty 20.3

## 2021-11-06 MED ORDER — SODIUM CHLORIDE 0.9% FLUSH: 3.0000 mL | Freq: Once | INTRAVENOUS | Status: DC

## 2021-11-06 MED ORDER — LABETALOL HCL 5 MG/ML IV SOLN
0.5000 mg/min | Status: DC
  Administered 2021-11-07: 1 mg/min via INTRAVENOUS
  Administered 2021-11-07: 0.5 mg/min via INTRAVENOUS
  Administered 2021-11-08: 1.5 mg/min via INTRAVENOUS
  Filled 2021-11-06 (×4): qty 80

## 2021-11-06 MED ORDER — LABETALOL HCL 5 MG/ML IV SOLN
INTRAVENOUS | Status: AC
  Filled 2021-11-06: qty 4

## 2021-11-06 MED ORDER — CLEVIDIPINE BUTYRATE 0.5 MG/ML IV EMUL
0.0000 mg/h | INTRAVENOUS | Status: DC
  Administered 2021-11-07 (×2): 28 mg/h via INTRAVENOUS
  Administered 2021-11-07 (×2): 32 mg/h via INTRAVENOUS
  Administered 2021-11-07: 16 mg/h via INTRAVENOUS
  Administered 2021-11-07 (×2): 28 mg/h via INTRAVENOUS
  Administered 2021-11-07: 32 mg/h via INTRAVENOUS
  Administered 2021-11-07: 21 mg/h via INTRAVENOUS
  Administered 2021-11-07: 8 mg/h via INTRAVENOUS
  Administered 2021-11-08: 24 mg/h via INTRAVENOUS
  Administered 2021-11-08: 28 mg/h via INTRAVENOUS
  Administered 2021-11-08 (×2): 24 mg/h via INTRAVENOUS
  Administered 2021-11-08: 32 mg/h via INTRAVENOUS
  Administered 2021-11-08: 24 mg/h via INTRAVENOUS
  Administered 2021-11-08 (×3): 30 mg/h via INTRAVENOUS
  Administered 2021-11-08: 22 mg/h via INTRAVENOUS
  Administered 2021-11-08: 28 mg/h via INTRAVENOUS
  Administered 2021-11-09: 21 mg/h via INTRAVENOUS
  Administered 2021-11-09: 20 mg/h via INTRAVENOUS
  Administered 2021-11-09: 22 mg/h via INTRAVENOUS
  Administered 2021-11-09: 14 mg/h via INTRAVENOUS
  Administered 2021-11-09: 22 mg/h via INTRAVENOUS
  Administered 2021-11-09 (×2): 18 mg/h via INTRAVENOUS
  Administered 2021-11-09: 14 mg/h via INTRAVENOUS
  Administered 2021-11-10 (×2): 32 mg/h via INTRAVENOUS
  Administered 2021-11-10: 22 mg/h via INTRAVENOUS
  Administered 2021-11-10: 32 mg/h via INTRAVENOUS
  Administered 2021-11-10: 30 mg/h via INTRAVENOUS
  Administered 2021-11-10: 32 mg/h via INTRAVENOUS
  Filled 2021-11-06 (×2): qty 50
  Filled 2021-11-06 (×3): qty 100
  Filled 2021-11-06: qty 50
  Filled 2021-11-06 (×2): qty 200
  Filled 2021-11-06 (×4): qty 100
  Filled 2021-11-06: qty 50
  Filled 2021-11-06: qty 100
  Filled 2021-11-06: qty 200
  Filled 2021-11-06: qty 50
  Filled 2021-11-06 (×4): qty 100
  Filled 2021-11-06 (×2): qty 200
  Filled 2021-11-06: qty 100
  Filled 2021-11-06: qty 50
  Filled 2021-11-06: qty 200
  Filled 2021-11-06: qty 50
  Filled 2021-11-06: qty 100
  Filled 2021-11-06 (×2): qty 200

## 2021-11-06 MED ORDER — LABETALOL HCL 5 MG/ML IV SOLN
20.0000 mg | Freq: Once | INTRAVENOUS | Status: AC
  Administered 2021-11-06: 20 mg via INTRAVENOUS

## 2021-11-06 MED ORDER — STROKE: EARLY STAGES OF RECOVERY BOOK
Freq: Once | Status: AC
  Filled 2021-11-06: qty 1

## 2021-11-06 MED ORDER — ACETAMINOPHEN 325 MG PO TABS: 650.0000 mg | ORAL_TABLET | ORAL | Status: DC | PRN

## 2021-11-06 MED ORDER — FUROSEMIDE 10 MG/ML IJ SOLN
40.0000 mg | Freq: Once | INTRAMUSCULAR | Status: AC
  Administered 2021-11-06: 40 mg via INTRAVENOUS
  Filled 2021-11-06: qty 4

## 2021-11-06 MED ORDER — PANTOPRAZOLE SODIUM 40 MG IV SOLR
40.0000 mg | Freq: Every day | INTRAVENOUS | Status: DC
  Administered 2021-11-07 – 2021-11-08 (×2): 40 mg via INTRAVENOUS
  Filled 2021-11-06 (×3): qty 10

## 2021-11-06 MED ORDER — CLEVIDIPINE BUTYRATE 0.5 MG/ML IV EMUL
0.0000 mg/h | INTRAVENOUS | Status: DC
  Filled 2021-11-06 (×2): qty 50

## 2021-11-06 MED ORDER — CLEVIDIPINE BUTYRATE 0.5 MG/ML IV EMUL
INTRAVENOUS | Status: AC
  Administered 2021-11-06: 1 mg/h via INTRAVENOUS
  Filled 2021-11-06: qty 50

## 2021-11-06 MED ORDER — ACETAMINOPHEN 650 MG RE SUPP: 650.0000 mg | RECTAL | Status: DC | PRN

## 2021-11-06 MED ORDER — ONDANSETRON HCL 4 MG/2ML IJ SOLN
4.0000 mg | Freq: Once | INTRAMUSCULAR | Status: DC
  Filled 2021-11-06: qty 2

## 2021-11-06 MED ORDER — SENNOSIDES-DOCUSATE SODIUM 8.6-50 MG PO TABS: 1.0000 | ORAL_TABLET | Freq: Two times a day (BID) | ORAL | Status: DC

## 2021-11-06 NOTE — ED Notes (Signed)
RT called for bipap ?Crackles heard on auscultation  ?Lasix given by Raquel Sarna RN  ?

## 2021-11-06 NOTE — ED Notes (Signed)
Sister Rudolf Blizard [phone] would like an update ?

## 2021-11-06 NOTE — H&P (Signed)
NEUROLOGY CONSULTATION NOTE   Date of service: November 06, 2021 Patient Name: Paul [name] MRN:  [id number] DOB:  1986/04/26  _ _ _   _ __   _ __ _ _  __ __   _ __   __ _  History of Present Illness  Paul [name] is a 36 y.o. 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  He was normal at 2200, at 2210 on 11/06/21, he suddenly stopped talking. EMS were called and he was brought in as a code stroke. He has hypertensive but stopped taking any meds during pandemic.  BP per EMS Was 270s/170s.  CTH with a 8ml Left Basal Ganglia ICH.  LKW: 2210 on 11/06/21 mRS: 0 tNKASE: not offered 2/2 ICH Thrombectomy: not offered 2/2 ICH ICH score: 0 NIHSS components Score: Comment  1a Level of Conscious 0[x]  1[]  2[]  3[]      1b LOC Questions 0[]  1[]  2[x]       1c LOC Commands 0[x]  1[]  2[]       2 Best Gaze 0[x]  1[]  2[]       3 Visual 0[x]  1[]  2[]  3[]      4 Facial Palsy 0[]  1[x]  2[]  3[]      5a Motor Arm - left 0[x]  1[]  2[]  3[]  4[]  UN[]    5b Motor Arm - Right 0[]  1[x]  2[]  3[]  4[]  UN[]    6a Motor Leg - Left 0[x]  1[]  2[]  3[]  4[]  UN[]    6b Motor Leg - Right 0[x]  1[]  2[]  3[]  4[]  UN[]    7 Limb Ataxia 0[]  1[x]  2[]  3[]  UN[]     8 Sensory 0[x]  1[]  2[]  UN[]      9 Best Language 0[]  1[]  2[]  3[x]      10 Dysarthria 0[]  1[]  2[x]  UN[]      11 Extinct. and Inattention 0[x]  1[]  2[]       TOTAL: 10      ROS   Unable to obtain detailed ROS due to aphasia but he denies any headache.  Past History   Past Medical History:  Diagnosis Date   Exposure to STD 02/04/2020   Hypertension    Past Surgical History:  Procedure Laterality Date   EYE SURGERY     HEMATOMA EVACUATION     2003   PEG TUBE PLACEMENT     VENTRICULOPERITONEAL SHUNT     Family History  Problem Relation Age of Onset   Hypertension Mother    Multiple sclerosis Mother    Diabetes Paternal Grandmother     Hypertension Paternal Grandmother    Stroke Neg Hx    Social History   Socioeconomic History   Marital status: Married    Spouse name: Not on file   Number of children: Not on file   Years of education: Not on file   Highest education level: Not on file  Occupational History   Occupation: Tech Support  Tobacco Use   Smoking status: Never   Smokeless tobacco: Never  Substance and Sexual Activity   Alcohol use: No   Drug use: No   Sexual activity: Not on file  Other Topics Concern   Not on file  Social History Narrative   ** Merged History Encounter **       Social Determinants of Health   Financial Resource Strain: Not on file  Food Insecurity: Not on file  Transportation Needs: Not on file  Physical Activity: Not  on file  Stress: Not on file  Social Connections: Not on file   No Known Allergies  Medications  (Not in a hospital admission)    Vitals   Vitals:   11/06/21 2312 11/06/21 2315 11/06/21 2317 11/06/21 2324  BP: (!) 233/160 (!) 249/153 (!) 247/159 (!) 231/135  Weight:         Body mass index is 39 kg/m.  Physical Exam   General: Laying comfortably in bed; in no acute distress.  HENT: Normal oropharynx and mucosa. Normal external appearance of ears and nose.  Neck: Supple, no pain or tenderness  CV: No JVD. No peripheral edema.  Pulmonary: Symmetric Chest rise. Normal respiratory effort.  Abdomen: Soft to touch, non-tender.  Ext: No cyanosis, edema, or deformity  Skin: No rash. Normal palpation of skin.   Musculoskeletal: Normal digits and nails by inspection. No clubbing.   Neurologic Examination  Mental status/Cognition: Alert, oriented to self, place, month and year, good attention.  Speech/language: mute, no speech. comprehension intact. Unable to name objects, unable to repeat. Cranial nerves:   CN II Pupils equal and reactive to light, no VF deficits   CN III,IV,VI Unable to Abduct his R eye which is chronic per patient. EOM  otherwise intact, no gaze preference or deviation, no nystagmus.   CN V normal sensation in V1, V2, and V3 segments bilaterally   CN VII Mild R facial droop   CN VIII normal hearing to speech   CN IX & X Unable to assess.   CN XI 5/5 head turn and 5/5 shoulder shrug bilaterally   CN XII midline tongue but unable to protrude on command.   Motor:  Muscle bulk: normal, tone normal, pronator drift yes RUE pronator drift. tremor None Mvmt Root Nerve  Muscle Right Left Comments  SA C5/6 Ax Deltoid 4 5   EF C5/6 Mc Biceps 4 5   EE C6/7/8 Rad Triceps 4 5   WF C6/7 Med FCR     WE C7/8 PIN ECU     F Ab C8/T1 U ADM/FDI 4+ 5   HF L1/2/3 Fem Illopsoas 5 5   KE L2/3/4 Fem Quad 5 5   DF L4/5 D Peron Tib Ant 5 5   PF S1/2 Tibial Grc/Sol 5 5    Reflexes:  Right Left Comments  Pectoralis      Biceps (C5/6) 2 2   Brachioradialis (C5/6) 2 2    Triceps (C6/7) 2 2    Patellar (L3/4) 2 2    Achilles (S1)      Hoffman      Plantar     Jaw jerk    Sensation:  Light touch Intact throughout   Pin prick    Temperature    Vibration   Proprioception    Coordination/Complex Motor:  - Finger to Nose with ataxia in RUE - Heel to shin intact BL - Rapid alternating movement: unable to do with RUE - Gait: Deferred for patient safety.  Labs   CBC:  Recent Labs  Lab 11/06/21 2306  HGB 19.4*  HCT 57.0*    Basic Metabolic Panel:  Lab Results  Component Value Date   NA 138 11/06/2021   K 4.2 11/06/2021   CO2 17 (L) 10/15/2020   GLUCOSE 100 (H) 11/06/2021   BUN 17 11/06/2021   CREATININE 1.40 (H) 11/06/2021   CALCIUM 9.8 10/15/2020   GFRNONAA 67 10/15/2020   GFRAA 78 10/15/2020   Lipid Panel:  Lab Results  Component  Value Date   LDLCALC 125 (H) 10/15/2020   HgbA1c:  Lab Results  Component Value Date   HGBA1C 5.2 10/15/2020   Urine Drug Screen:     Component Value Date/Time   LABOPIA NONE DETECTED 04/04/2014 0316   COCAINSCRNUR NONE DETECTED 04/04/2014 0316   LABBENZ NONE  DETECTED 04/04/2014 0316   AMPHETMU NONE DETECTED 04/04/2014 0316   THCU NONE DETECTED 04/04/2014 0316   LABBARB NONE DETECTED 04/04/2014 0316    Alcohol Level     Component Value Date/Time   ETH <11 04/04/2014 0121    CT Head without contrast(Personally reviewed): L BG ICH about 8mm.  MR Angio head without contrast and Carotid Duplex BL: pending  MRI Brain: pending  Impression   Paul [name] is a 36 y.o. 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 Found to have L BG hemorrhage about 8ml and in hypertensive emergency with SBP ins 270s on arrival. His neurologic examination is notable for speech apraxia, RUE weakness and incoordination. 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 He went into flash pulmonary edema in the ED.  Primary Diagnosis:  Left basal ganglia hemorrhage  Secondary Diagnosis: Acute respiratory failure with hypoxia due to flash pulmonary edema and hypertensive emergency.  Recommendations   Left Basal Ganglia Hemorrhage with ICH score of 0: - Admit to ICU - Stability scan in 6 hours or STAT with any neurological decline - Frequent neuro checks; q83min for 1 hour, then q1hour - No antiplatelets or anticoagulants due to ICH - SCD for DVT prophylaxis, pharmacological DVT ppx at 24 hours if ICH is stable - Blood pressure control with goal systolic 130 - 150, cleverplex and labetalol PRN - Stroke labs, HgbA1c, fasting lipid panel - MRI brain with and without contrast when stabilized to evaluate for underlying mass - MRA without contrast of the brain and Vasc US carotid duplex to evaluate for underlying vascular abnormality. - Risk factor modification - Echocardiogram - PT consult, OT consult, Speech consult. - Stroke team to follow   Hypertensive emergency:  - Goal SBP 130-150 in the setting of ICH. -  He is maxed out on Cleviprex and thus labetalol gtt was added.   Flash Pulmonary Edema: - PCCM team on board. - On Bipap - Lasix 80mg  Iv once given in the ED ______________________________________________________________________  This patient is critically ill and at significant risk of neurological worsening, death and care requires constant monitoring of vital signs, hemodynamics,respiratory and cardiac monitoring, neurological assessment, discussion with family, other specialists and medical decision making of high complexity. I spent 100 minutes of neurocritical care time  in the care of  this patient. This was time spent independent of any time provided by nurse practitioner or PA.  Erick Blinks [facility] Pager Number [phone] 11/07/2021  1:24 AM   Thank you for the opportunity to take part in the care of this patient. If you have any further questions, please contact the neurology consultation attending.  Signed,  Erick Blinks [facility] Pager Number [phone] _ _ _   _ __   _ __ _ _  __ __   _ __   __ _

## 2021-11-06 NOTE — ED Triage Notes (Signed)
Pt BIB ems from home - CODE STROKE  ?Came home from work at 2200 talking to family at baseline. LKW 2200 ?Around 2210 pt could not talk - told family to call 911 ?Pt completely aphasic with EMS  ?No other deficits noted with EMS  ?Pt with hx of TBI around 6 years ago with no reported deficits  ?Hx of HTN non compliant with meds  ?Has a lazy right eye per EMS  ?Pt alert and talks with hands and answers as best as he can  ?VS with EMS  ?278/172 ?HR 104 ?99% RA  ?

## 2021-11-06 NOTE — ED Notes (Addendum)
Pt desatting on 6L [place] - WOB increased, pt grunting- placed on NRB - Dr Nicanor Alcon called to bedside  ?

## 2021-11-06 NOTE — ED Provider Notes (Addendum)
?[facility] ?Provider Note ? ? ?CSN: [id number] ?Arrival date & time: 11/06/21  2258 ? ?  ? ?History ? ?No chief complaint on file. ? ? ?Paul [name] is a 36 y.o. male. ? ?The history is provided by the EMS personnel and medical records. The history is limited by the condition of the patient.  ?Cerebrovascular Accident ?This is a new problem. The current episode started 1 to 2 hours ago. The problem occurs constantly. The problem has not changed since onset.Pertinent negatives include no chest pain, no abdominal pain and no shortness of breath. Nothing aggravates the symptoms. He has tried nothing for the symptoms. The treatment provided no relief.  ?Patient with HTN not on medication presents with weakness in the RUE and RLE and aphasia that started at 1010 pm.   ?  ? ?Home Medications ?Prior to Admission medications   ?Medication Sig Start Date End Date Taking? Authorizing Provider  ?amLODipine (NORVASC) 10 MG tablet Take 1 tablet (10 mg total) by mouth daily. 10/15/20   Daisy Floro, DO  ?   ? ?Allergies    ?Patient has no known allergies.   ? ?Review of Systems   ?Review of Systems  ?Unable to perform ROS: Acuity of condition  ?Constitutional:  Negative for fever.  ?HENT:  Negative for facial swelling.   ?Eyes:  Negative for redness.  ?Respiratory:  Negative for shortness of breath.   ?Cardiovascular:  Negative for chest pain.  ?Gastrointestinal:  Negative for abdominal pain.  ?Neurological:  Positive for speech difficulty and weakness.  ? ?Physical Exam ?Updated Vital Signs ?BP (!) 231/135   Wt (!) 141.5 kg   BMI 39.00 kg/m?  ?Physical Exam ?Vitals and nursing note reviewed. Exam conducted with a chaperone present.  ?Constitutional:   ?   Appearance: Normal appearance. He is not diaphoretic.  ?HENT:  ?   Head: Normocephalic and atraumatic.  ?   Nose: Nose normal.  ?   Mouth/Throat:  ?   Mouth: Mucous membranes are moist.  ?Eyes:  ?   Conjunctiva/sclera:  Conjunctivae normal.  ?   Pupils: Pupils are equal, round, and reactive to light.  ?Cardiovascular:  ?   Rate and Rhythm: Normal rate and regular rhythm.  ?   Pulses: Normal pulses.  ?   Heart sounds: Normal heart sounds.  ?Pulmonary:  ?   Effort: Pulmonary effort is normal.  ?   Breath sounds: Normal breath sounds.  ?Abdominal:  ?   General: Bowel sounds are normal.  ?   Palpations: Abdomen is soft.  ?   Tenderness: There is no abdominal tenderness. There is no guarding.  ?Musculoskeletal:     ?   General: Normal range of motion.  ?   Cervical back: Normal range of motion and neck supple.  ?   Right lower leg: No edema.  ?   Left lower leg: No edema.  ?Skin: ?   General: Skin is warm and dry.  ?   Capillary Refill: Capillary refill takes less than 2 seconds.  ?Neurological:  ?   Mental Status: He is alert.  ?   Cranial Nerves: Cranial nerve deficit present.  ?   Motor: Weakness present.  ? ? ?ED Results / Procedures / Treatments   ?Labs ?(all labs ordered are listed, but only abnormal results are displayed) ?Results for orders placed or performed during the hospital encounter of 11/06/21  ?I-stat chem 8, ED  ?Result Value Ref Range  ?  Sodium 138 135 - 145 mmol/L  ? Potassium 4.2 3.5 - 5.1 mmol/L  ? Chloride 105 98 - 111 mmol/L  ? BUN 17 6 - 20 mg/dL  ? Creatinine, Ser 1.40 (H) 0.61 - 1.24 mg/dL  ? Glucose, Bld 100 (H) 70 - 99 mg/dL  ? Calcium, Ion 0.95 (L) 1.15 - 1.40 mmol/L  ? TCO2 27 22 - 32 mmol/L  ? Hemoglobin 19.4 (H) 13.0 - 17.0 g/dL  ? HCT 57.0 (H) 39.0 - 52.0 %  ? ?CT HEAD CODE STROKE WO CONTRAST ? ?Result Date: 11/06/2021 ?CLINICAL DATA:  Code stroke.  Aphasia EXAM: CT HEAD WITHOUT CONTRAST TECHNIQUE: Contiguous axial images were obtained from the base of the skull through the vertex without intravenous contrast. RADIATION DOSE REDUCTION: This exam was performed according to the departmental dose-optimization program which includes automated exposure control, adjustment of the mA and/or kV according to  patient size and/or use of iterative reconstruction technique. COMPARISON:  04/04/2014 FINDINGS: Brain: Acute intraparenchymal hemorrhage in the left basal ganglia measures 2.0 x 2.0 x 2.0 cm. There is a left frontal approach ventriculostomy catheter with tip in the right lateral ventricle. No hydrocephalus. No midline shift or other mass effect. Vascular: No abnormal hyperdensity of the major intracranial arteries or dural venous sinuses. No intracranial atherosclerosis. Skull: Remote right posterior craniotomy Sinuses/Orbits: Small amount of left mastoid fluid. The orbits are normal. IMPRESSION: 1. Acute intraparenchymal hemorrhage in the left basal ganglia. 2. Left frontal approach ventriculostomy catheter with tip in the right lateral ventricle. No hydrocephalus. 3. Critical Value/emergent results were called by telephone at the time of interpretation on 11/06/2021 at 11:16 pm to provider [facility], who verbally acknowledged these results. Electronically Signed   By: Ulyses Jarred M.D.   On: 11/06/2021 23:16   ? ?EKG ?None ? ?Radiology ?CT HEAD CODE STROKE WO CONTRAST ? ?Result Date: 11/06/2021 ?CLINICAL DATA:  Code stroke.  Aphasia EXAM: CT HEAD WITHOUT CONTRAST TECHNIQUE: Contiguous axial images were obtained from the base of the skull through the vertex without intravenous contrast. RADIATION DOSE REDUCTION: This exam was performed according to the departmental dose-optimization program which includes automated exposure control, adjustment of the mA and/or kV according to patient size and/or use of iterative reconstruction technique. COMPARISON:  04/04/2014 FINDINGS: Brain: Acute intraparenchymal hemorrhage in the left basal ganglia measures 2.0 x 2.0 x 2.0 cm. There is a left frontal approach ventriculostomy catheter with tip in the right lateral ventricle. No hydrocephalus. No midline shift or other mass effect. Vascular: No abnormal hyperdensity of the major intracranial arteries or dural venous  sinuses. No intracranial atherosclerosis. Skull: Remote right posterior craniotomy Sinuses/Orbits: Small amount of left mastoid fluid. The orbits are normal. IMPRESSION: 1. Acute intraparenchymal hemorrhage in the left basal ganglia. 2. Left frontal approach ventriculostomy catheter with tip in the right lateral ventricle. No hydrocephalus. 3. Critical Value/emergent results were called by telephone at the time of interpretation on 11/06/2021 at 11:16 pm to provider [facility], who verbally acknowledged these results. Electronically Signed   By: Ulyses Jarred M.D.   On: 11/06/2021 23:16   ? ?Procedures ?Procedures  ? ? ?Medications Ordered in ED ?Medications  ?sodium chloride flush (NS) 0.9 % injection 3 mL (has no administration in time range)  ?ondansetron (ZOFRAN) injection 4 mg (has no administration in time range)  ? stroke: mapping our early stages of recovery book (has no administration in time range)  ?acetaminophen (TYLENOL) tablet 650 mg (has no administration in time range)  ?  Or  ?acetaminophen (TYLENOL) 160 MG/5ML solution 650 mg (has no administration in time range)  ?  Or  ?acetaminophen (TYLENOL) suppository 650 mg (has no administration in time range)  ?senna-docusate (Senokot-S) tablet 1 tablet (has no administration in time range)  ?pantoprazole (PROTONIX) injection 40 mg (has no administration in time range)  ?labetalol (NORMODYNE) injection 20 mg (has no administration in time range)  ?  And  ?clevidipine (CLEVIPREX) infusion 0.5 mg/mL (has no administration in time range)  ?labetalol (NORMODYNE) 5 MG/ML injection (has no administration in time range)  ?labetalol (NORMODYNE) injection 20 mg (20 mg Intravenous Given 11/06/21 2308)  ? ? ?ED Course/ Medical Decision Making/ A&P ?  ?                        ?Medical Decision Making ?Patient with HTN not on medication and acute stroke like symptoms  ? ?Amount and/or Complexity of Data Reviewed ?Independent Historian: EMS ?   Details: see  above ?External Data Reviewed: notes. ?   Details: Previous outpatient notes ?Labs: ordered. ?   Details: all labs reviewed by me: slight elevation in creatinine at 1.4, normal sodium and potassium ?Radiology: ordered. ?   Deta

## 2021-11-07 ENCOUNTER — Inpatient Hospital Stay (HOSPITAL_COMMUNITY): Payer: Medicaid Other

## 2021-11-07 ENCOUNTER — Encounter (HOSPITAL_COMMUNITY): Payer: BC Managed Care – PPO

## 2021-11-07 DIAGNOSIS — I639 Cerebral infarction, unspecified: Secondary | ICD-10-CM | POA: Insufficient documentation

## 2021-11-07 DIAGNOSIS — R1312 Dysphagia, oropharyngeal phase: Secondary | ICD-10-CM

## 2021-11-07 DIAGNOSIS — I619 Nontraumatic intracerebral hemorrhage, unspecified: Secondary | ICD-10-CM

## 2021-11-07 DIAGNOSIS — J810 Acute pulmonary edema: Secondary | ICD-10-CM | POA: Insufficient documentation

## 2021-11-07 DIAGNOSIS — I169 Hypertensive crisis, unspecified: Secondary | ICD-10-CM

## 2021-11-07 DIAGNOSIS — I6389 Other cerebral infarction: Secondary | ICD-10-CM

## 2021-11-07 LAB — LIPID PANEL
Cholesterol: 229 mg/dL — ABNORMAL HIGH (ref 0–200)
HDL: 51 mg/dL (ref >40)
LDL Cholesterol: 136 mg/dL — ABNORMAL HIGH (ref 0–99)
Total CHOL/HDL Ratio: 4.5 RATIO
Triglycerides: 209 mg/dL — ABNORMAL HIGH (ref <150)
VLDL: 42 mg/dL — ABNORMAL HIGH (ref 0–40)

## 2021-11-07 LAB — RAPID URINE DRUG SCREEN, HOSP PERFORMED
Amphetamines: NOT DETECTED
Barbiturates: NOT DETECTED
Benzodiazepines: NOT DETECTED
Cocaine: NOT DETECTED
Opiates: NOT DETECTED
Tetrahydrocannabinol: NOT DETECTED

## 2021-11-07 LAB — CBC
HCT: 60.3 % — ABNORMAL HIGH (ref 39.0–52.0)
Hemoglobin: 20 g/dL — ABNORMAL HIGH (ref 13.0–17.0)
MCH: 27.1 pg (ref 26.0–34.0)
MCHC: 33.2 g/dL (ref 30.0–36.0)
MCV: 81.8 fL (ref 80.0–100.0)
Platelets: 261 10*3/uL (ref 150–400)
RBC: 7.37 MIL/uL — ABNORMAL HIGH (ref 4.22–5.81)
RDW: 16.1 % — ABNORMAL HIGH (ref 11.5–15.5)
WBC: 14.9 10*3/uL — ABNORMAL HIGH (ref 4.0–10.5)
nRBC: 0 % (ref 0.0–0.2)

## 2021-11-07 LAB — COMPREHENSIVE METABOLIC PANEL
ALT: 17 U/L (ref 0–44)
ALT: 22 U/L (ref 0–44)
AST: 26 U/L (ref 15–41)
AST: 32 U/L (ref 15–41)
Albumin: 3.5 g/dL (ref 3.5–5.0)
Albumin: 4 g/dL (ref 3.5–5.0)
Alkaline Phosphatase: 43 U/L (ref 38–126)
Alkaline Phosphatase: 57 U/L (ref 38–126)
Anion gap: 11 (ref 5–15)
Anion gap: 12 (ref 5–15)
BUN: 13 mg/dL (ref 6–20)
BUN: 13 mg/dL (ref 6–20)
CO2: 20 mmol/L — ABNORMAL LOW (ref 22–32)
CO2: 22 mmol/L (ref 22–32)
Calcium: 8.7 mg/dL — ABNORMAL LOW (ref 8.9–10.3)
Calcium: 9 mg/dL (ref 8.9–10.3)
Chloride: 104 mmol/L (ref 98–111)
Chloride: 104 mmol/L (ref 98–111)
Creatinine, Ser: 1.29 mg/dL — ABNORMAL HIGH (ref 0.61–1.24)
Creatinine, Ser: 1.86 mg/dL — ABNORMAL HIGH (ref 0.61–1.24)
GFR, Estimated: 48 mL/min — ABNORMAL LOW (ref >60)
GFR, Estimated: >60 mL/min (ref >60)
Glucose, Bld: 125 mg/dL — ABNORMAL HIGH (ref 70–99)
Glucose, Bld: 136 mg/dL — ABNORMAL HIGH (ref 70–99)
Potassium: 3.9 mmol/L (ref 3.5–5.1)
Potassium: 4 mmol/L (ref 3.5–5.1)
Sodium: 136 mmol/L (ref 135–145)
Sodium: 137 mmol/L (ref 135–145)
Total Bilirubin: 1.3 mg/dL — ABNORMAL HIGH (ref 0.3–1.2)
Total Bilirubin: 1.4 mg/dL — ABNORMAL HIGH (ref 0.3–1.2)
Total Protein: 6.2 g/dL — ABNORMAL LOW (ref 6.5–8.1)
Total Protein: 7.4 g/dL (ref 6.5–8.1)

## 2021-11-07 LAB — I-STAT ARTERIAL BLOOD GAS, ED
Acid-Base Excess: 0 mmol/L (ref 0.0–2.0)
Bicarbonate: 24.9 mmol/L (ref 20.0–28.0)
Calcium, Ion: 1.22 mmol/L (ref 1.15–1.40)
HCT: 62 % — ABNORMAL HIGH (ref 39.0–52.0)
Hemoglobin: 21.1 g/dL (ref 13.0–17.0)
O2 Saturation: 98 %
Patient temperature: 98
Potassium: 3.5 mmol/L (ref 3.5–5.1)
Sodium: 139 mmol/L (ref 135–145)
TCO2: 26 mmol/L (ref 22–32)
pCO2 arterial: 40 mmHg (ref 32–48)
pH, Arterial: 7.401 (ref 7.35–7.45)
pO2, Arterial: 106 mmHg (ref 83–108)

## 2021-11-07 LAB — APTT: aPTT: 26 seconds (ref 24–36)

## 2021-11-07 LAB — ECHOCARDIOGRAM COMPLETE
AR max vel: 2.69 cm2
AV Area VTI: 3.03 cm2
AV Area mean vel: 2.5 cm2
AV Mean grad: 12 mmHg
AV Peak grad: 16.9 mmHg
Ao pk vel: 2.05 m/s
Area-P 1/2: 4.15 cm2
Height: 75 in
S' Lateral: 2.4 cm
Weight: 4864.23 oz

## 2021-11-07 LAB — RESP PANEL BY RT-PCR (FLU A&B, COVID) ARPGX2
Influenza A by PCR: NEGATIVE
Influenza B by PCR: NEGATIVE
SARS Coronavirus 2 by RT PCR: NEGATIVE

## 2021-11-07 LAB — BRAIN NATRIURETIC PEPTIDE: B Natriuretic Peptide: 214 pg/mL — ABNORMAL HIGH (ref 0.0–100.0)

## 2021-11-07 LAB — GLUCOSE, CAPILLARY
Glucose-Capillary: 132 mg/dL — ABNORMAL HIGH (ref 70–99)
Glucose-Capillary: 128 mg/dL — ABNORMAL HIGH (ref 70–99)
Glucose-Capillary: 132 mg/dL — ABNORMAL HIGH (ref 70–99)
Glucose-Capillary: 122 mg/dL — ABNORMAL HIGH (ref 70–99)
Glucose-Capillary: 120 mg/dL — ABNORMAL HIGH (ref 70–99)
Glucose-Capillary: 145 mg/dL — ABNORMAL HIGH (ref 70–99)

## 2021-11-07 LAB — POCT I-STAT 7, (LYTES, BLD GAS, ICA,H+H)
Acid-Base Excess: 0 mmol/L (ref 0.0–2.0)
Bicarbonate: 24.5 mmol/L (ref 20.0–28.0)
Calcium, Ion: 1.23 mmol/L (ref 1.15–1.40)
HCT: 57 % — ABNORMAL HIGH (ref 39.0–52.0)
Hemoglobin: 19.4 g/dL — ABNORMAL HIGH (ref 13.0–17.0)
O2 Saturation: 92 %
Patient temperature: 99.7
Potassium: 3.7 mmol/L (ref 3.5–5.1)
Sodium: 140 mmol/L (ref 135–145)
TCO2: 26 mmol/L (ref 22–32)
pCO2 arterial: 41.3 mmHg (ref 32–48)
pH, Arterial: 7.384 (ref 7.35–7.45)
pO2, Arterial: 68 mmHg — ABNORMAL LOW (ref 83–108)

## 2021-11-07 LAB — I-STAT CHEM 8, ED
BUN: 17 mg/dL (ref 6–20)
Calcium, Ion: 1.06 mmol/L — ABNORMAL LOW (ref 1.15–1.40)
Chloride: 105 mmol/L (ref 98–111)
Creatinine, Ser: 1.3 mg/dL — ABNORMAL HIGH (ref 0.61–1.24)
Glucose, Bld: 131 mg/dL — ABNORMAL HIGH (ref 70–99)
HCT: 62 % — ABNORMAL HIGH (ref 39.0–52.0)
Hemoglobin: 21.1 g/dL (ref 13.0–17.0)
Potassium: 3.9 mmol/L (ref 3.5–5.1)
Sodium: 140 mmol/L (ref 135–145)
TCO2: 26 mmol/L (ref 22–32)

## 2021-11-07 LAB — PROTIME-INR
INR: 1.1 (ref 0.8–1.2)
Prothrombin Time: 13.7 seconds (ref 11.4–15.2)

## 2021-11-07 LAB — DIFFERENTIAL
Abs Immature Granulocytes: 0.05 10*3/uL (ref 0.00–0.07)
Basophils Absolute: 0.1 10*3/uL (ref 0.0–0.1)
Basophils Relative: 0 %
Eosinophils Absolute: 0 10*3/uL (ref 0.0–0.5)
Eosinophils Relative: 0 %
Immature Granulocytes: 0 %
Lymphocytes Relative: 7 %
Lymphs Abs: 1 10*3/uL (ref 0.7–4.0)
Monocytes Absolute: 0.6 10*3/uL (ref 0.1–1.0)
Monocytes Relative: 4 %
Neutro Abs: 13.2 10*3/uL — ABNORMAL HIGH (ref 1.7–7.7)
Neutrophils Relative %: 89 %

## 2021-11-07 LAB — TROPONIN I (HIGH SENSITIVITY)
Troponin I (High Sensitivity): 53 ng/L — ABNORMAL HIGH (ref <18)
Troponin I (High Sensitivity): 68 ng/L — ABNORMAL HIGH (ref <18)

## 2021-11-07 LAB — HEMOGLOBIN A1C
Hgb A1c MFr Bld: 5.1 % (ref 4.8–5.6)
Mean Plasma Glucose: 99.67 mg/dL

## 2021-11-07 LAB — MRSA NEXT GEN BY PCR, NASAL: MRSA by PCR Next Gen: NOT DETECTED

## 2021-11-07 LAB — CBG MONITORING, ED: Glucose-Capillary: 149 mg/dL — ABNORMAL HIGH (ref 70–99)

## 2021-11-07 LAB — HIV ANTIBODY (ROUTINE TESTING W REFLEX): HIV Screen 4th Generation wRfx: NONREACTIVE

## 2021-11-07 IMAGING — US US RENAL
1 series · 14 of 25 positions shown · non-contrast
Comparison: None.

CLINICAL DATA: Polycythemia

EXAM:
RENAL / URINARY TRACT ULTRASOUND COMPLETE

[Series 1: us renal · 33 acquisitions, 14 frames shown]
[im 1/33]
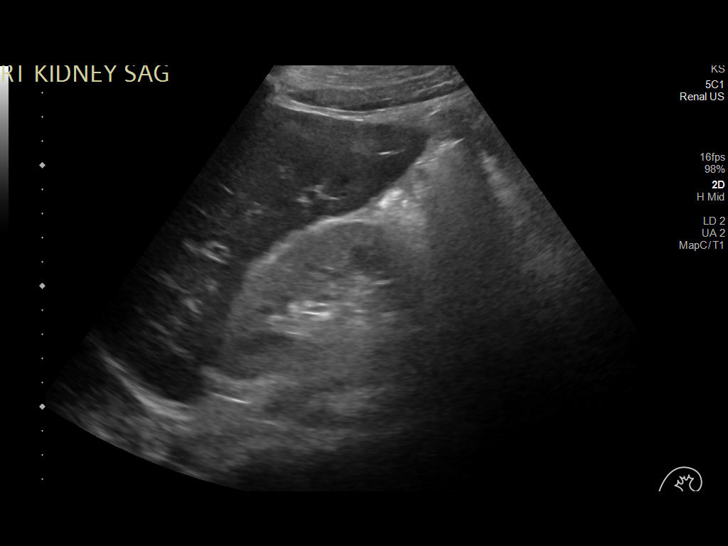
[im 3/33]
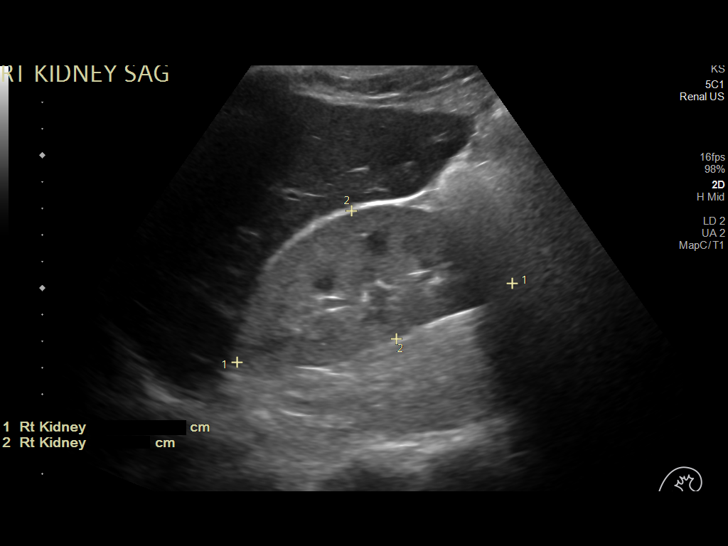
[im 6/33]
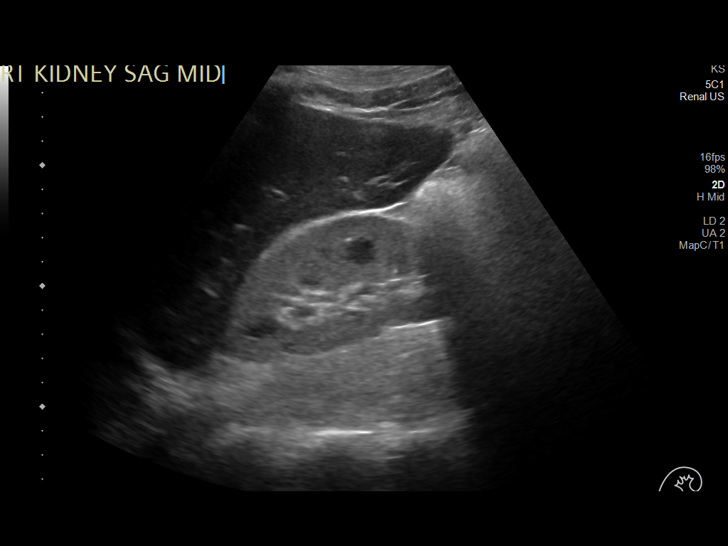
[im 9/33]
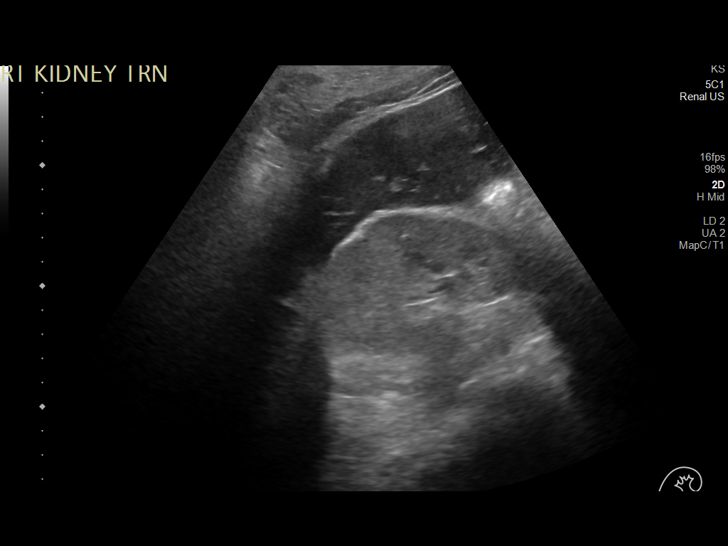
[im 11/33]
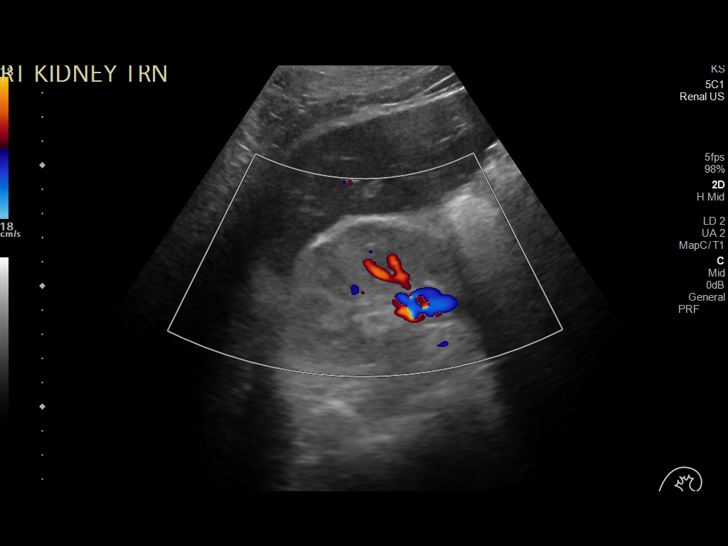
[im 13/33]
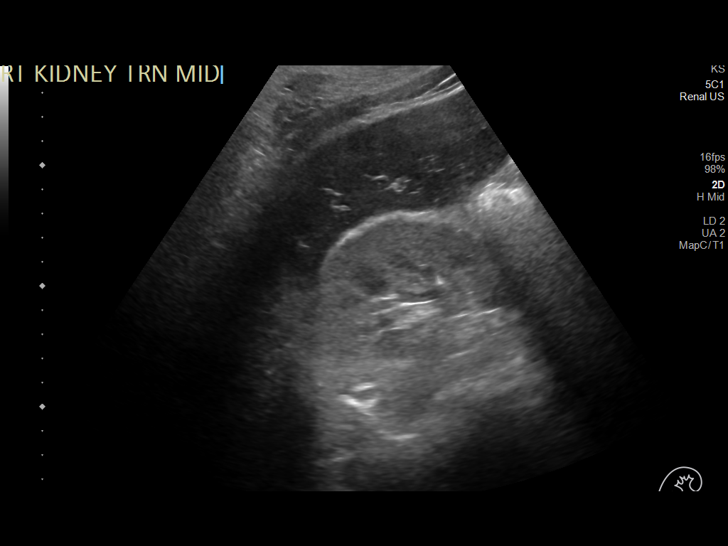
[im 15/33]
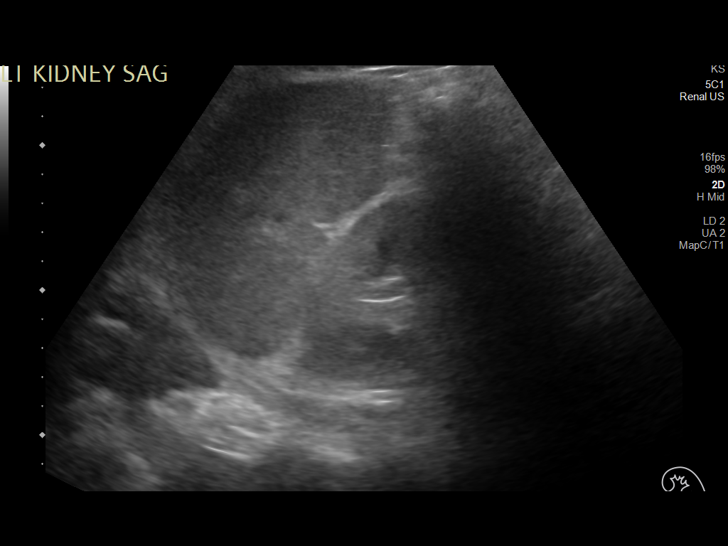
[im 18/33]
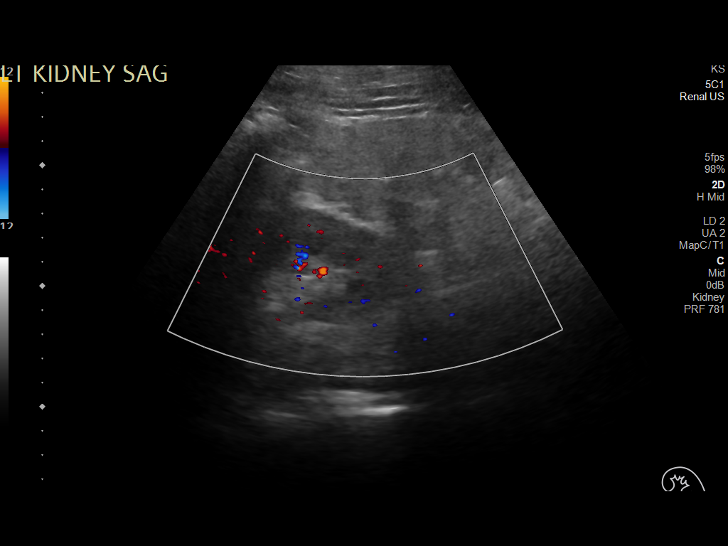
[im 21/33]
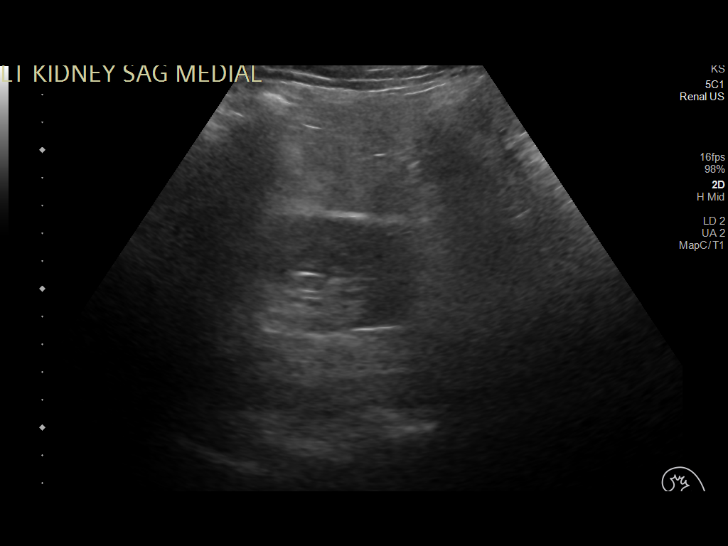
[im 22/33]
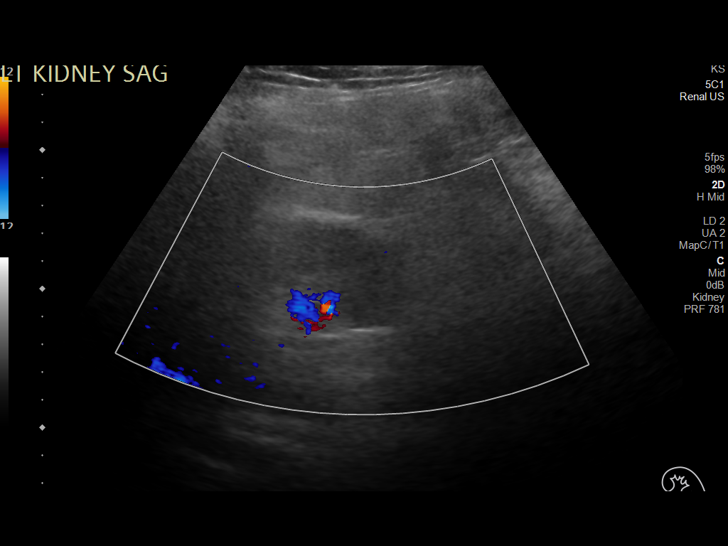
[im 25/33]
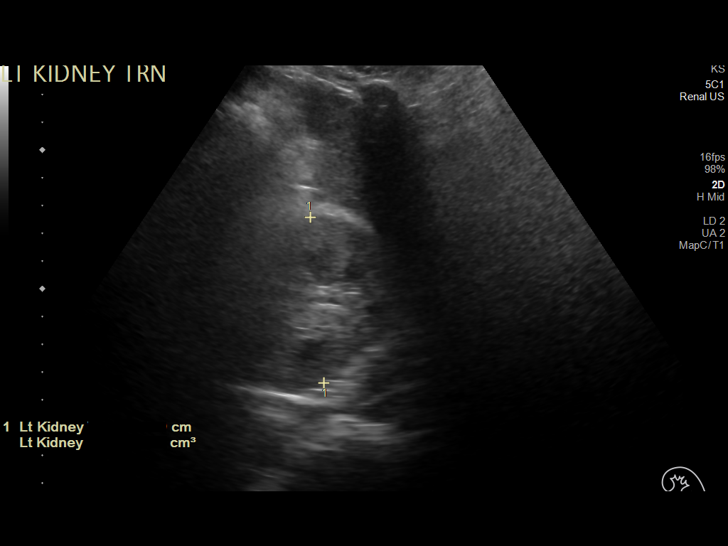
[im 27/33]
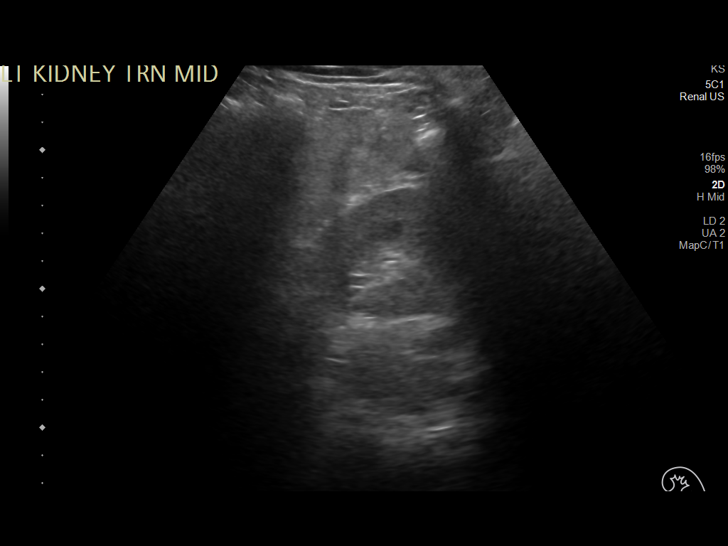
[im 30/33]
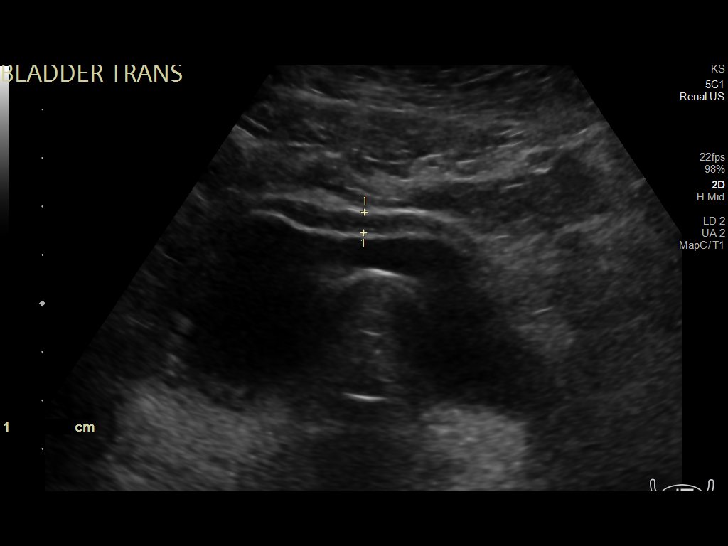
[im 33/33]
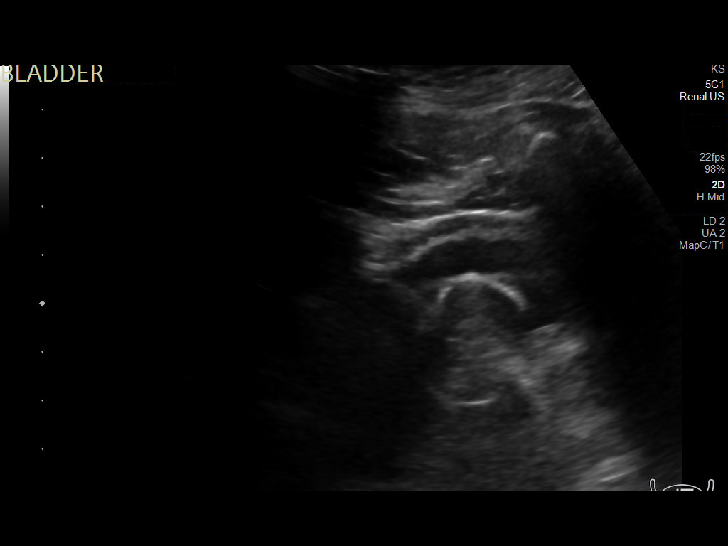

[14 of 25 positions shown; findings below may reference images not displayed]

FINDINGS: Right Kidney:

Renal measurements: 10.8 x 5.1 x 6.1 cm = volume: 175 mL. Diffusely
increased echogenicity with no mass or hydronephrosis identified.

Left Kidney:

Renal measurements: 10.8 x 4.5 x 6 cm = volume: 153 mL. Diffusely
increased echogenicity with no mass or hydronephrosis identified.

Bladder:

Contains a catheter and is incompletely distended.

Other:

None.
IMPRESSION: Increased echogenicity of the kidneys suggesting medical renal
disease. No hydronephrosis.

## 2021-11-07 IMAGING — CT CT HEAD W/O CM
4 series · 16 of 47 positions shown, 18 images · non-contrast
Comparison: None.

CLINICAL DATA: 36-year-old male code stroke presentation with acute
left basal ganglia hemorrhage.



[Series 3: head without · axial · non-contrast · 0.50mm/px · z∈[-137,-7]mm · 7 of 36 slices shown, 9 images]
[im 5/36  brain]
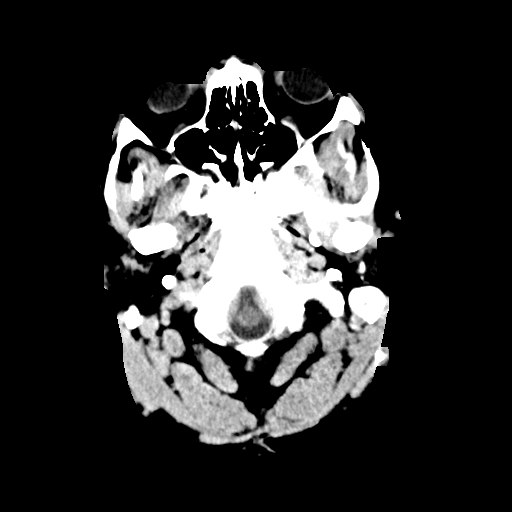
[im 5/36  bone]
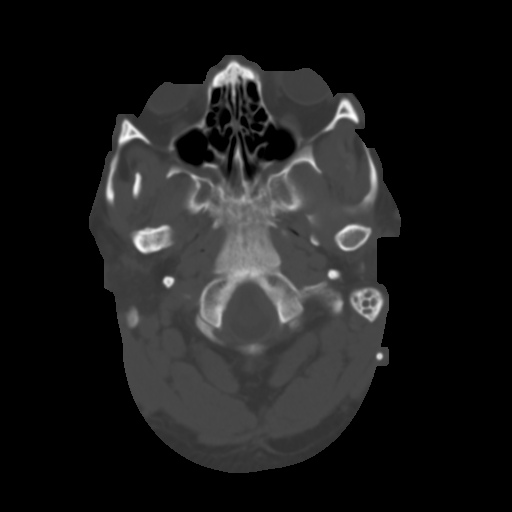
[im 9/36  brain]
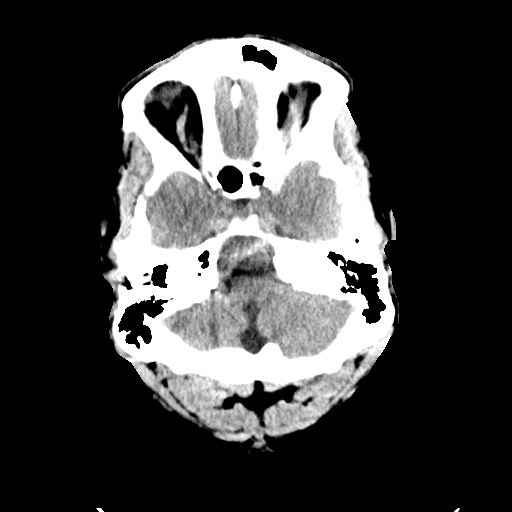
[im 14/36  brain]
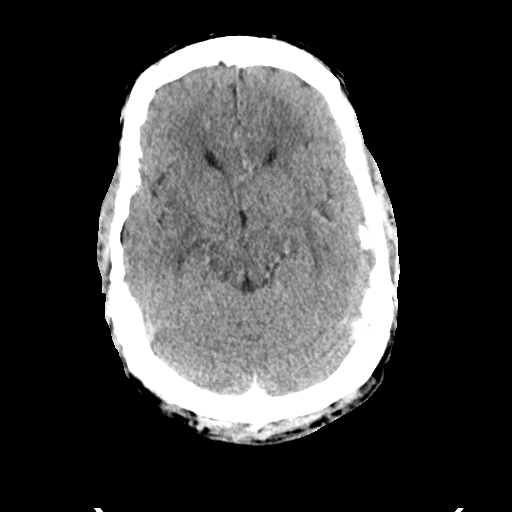
[im 18/36  brain]
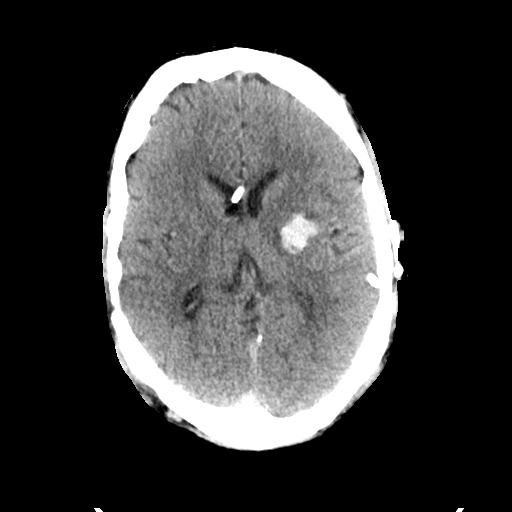
[im 22/36  brain]
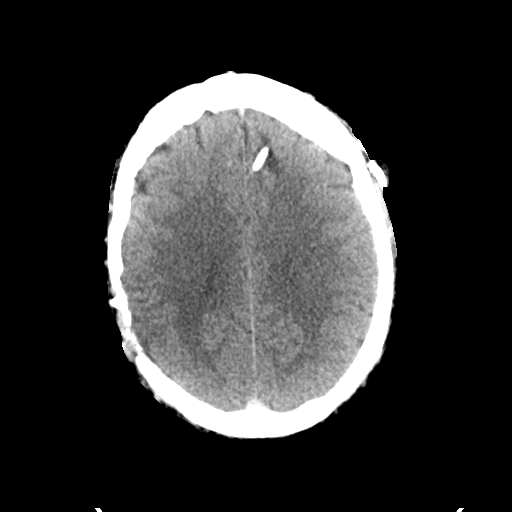
[im 22/36  bone]
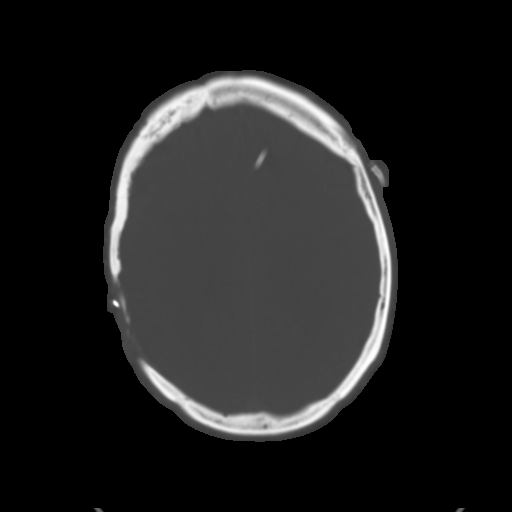
[im 27/36  brain]
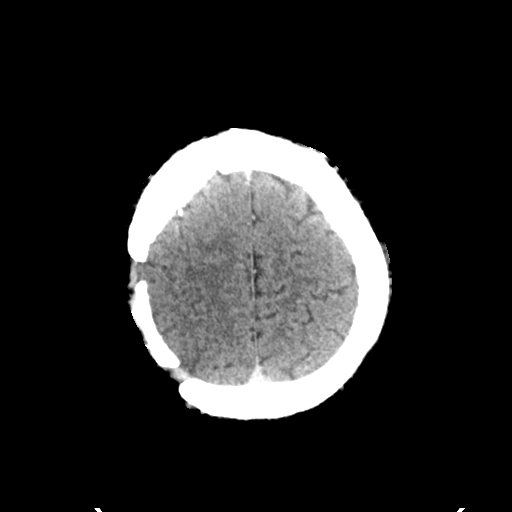
[im 31/36  brain]
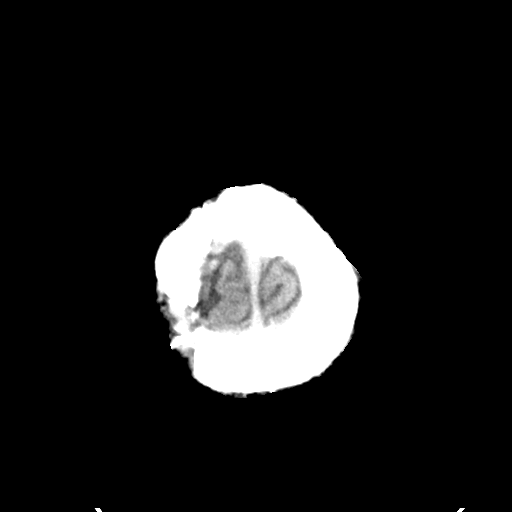

[Series 4: head bone · axial · 0.50mm/px · z∈[-141,-105]mm · 3 of 89 slices shown]
[im 9/89  bone]
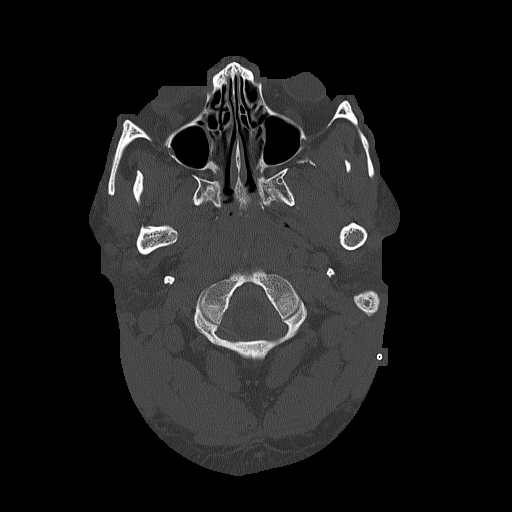
[im 18/89  bone]
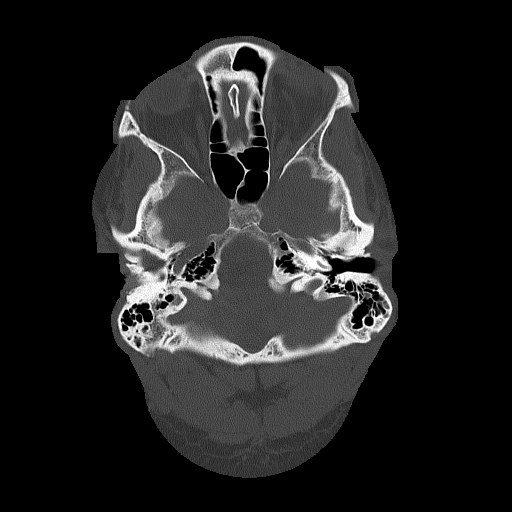
[im 27/89  bone]
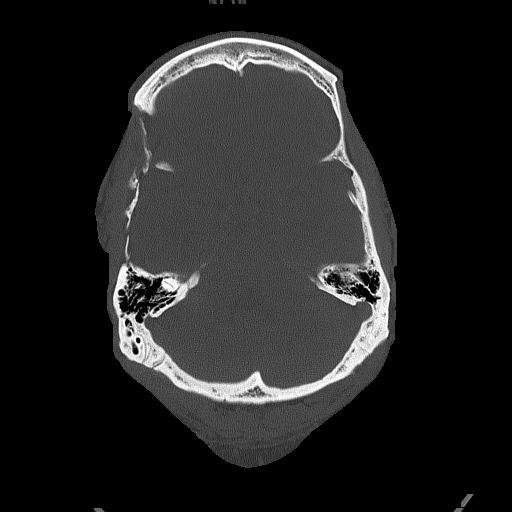

[Series 5: head without cor · coronal · non-contrast · 0.37mm/px · 3 of 76 slices shown]
[im 26/76  brain]
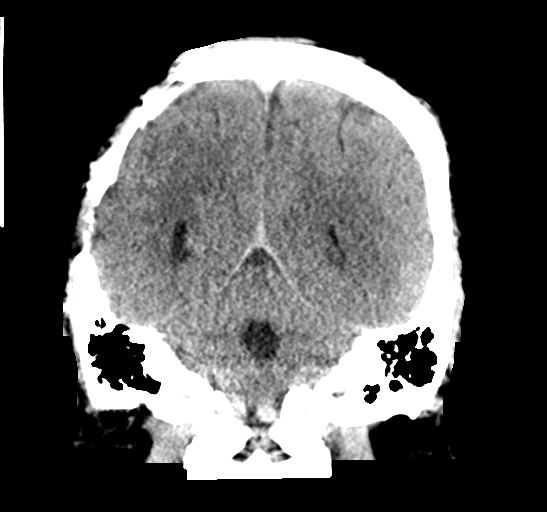
[im 34/76  brain]
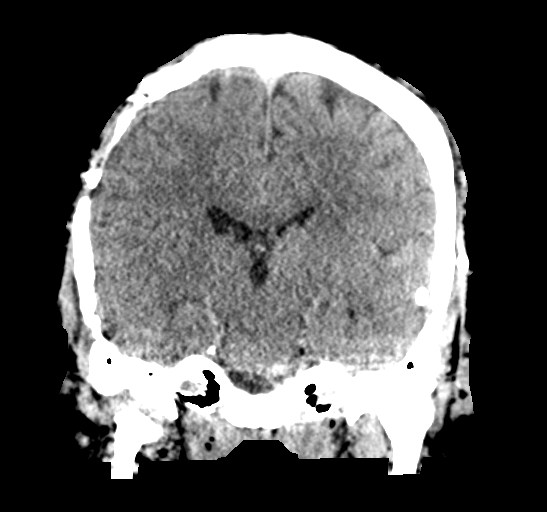
[im 42/76  brain]
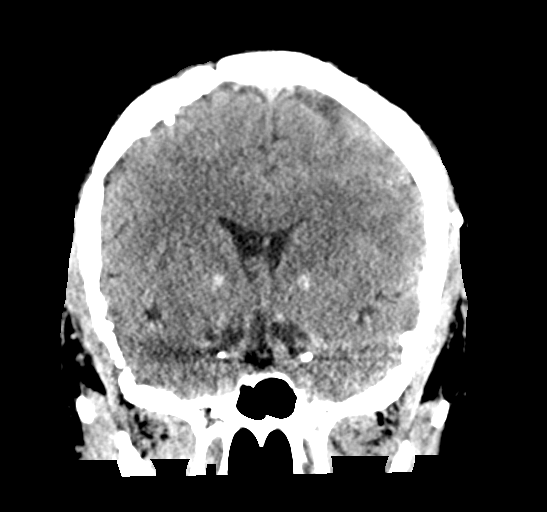

[Series 6: head without sag · sagittal · non-contrast · 0.36mm/px · 3 of 65 slices shown]
[im 22/65  brain]
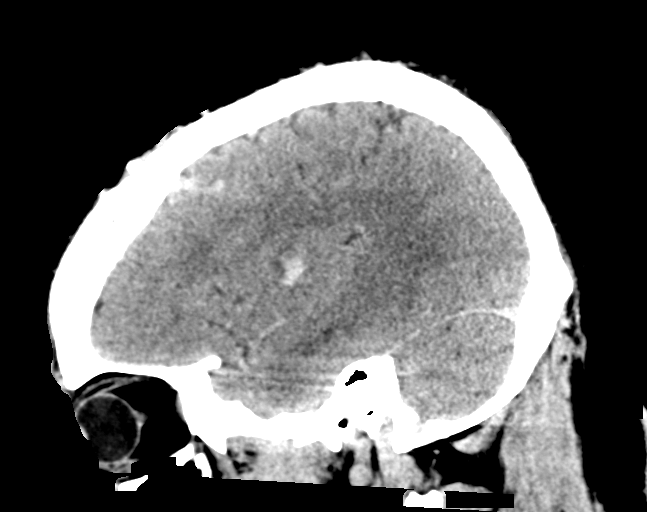
[im 33/65  brain]
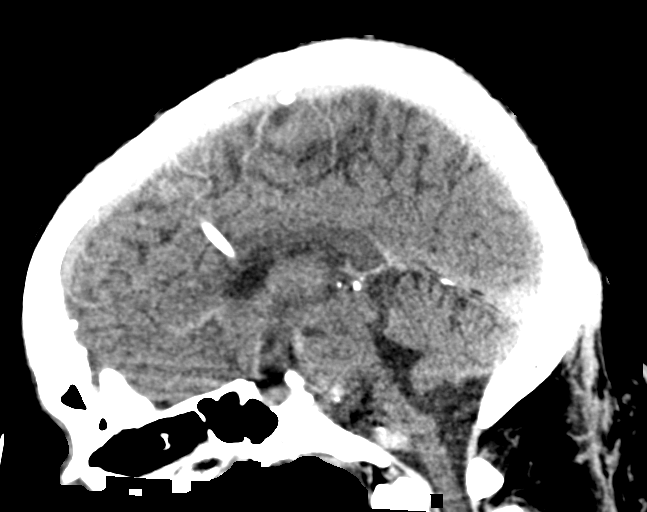
[im 43/65  brain]
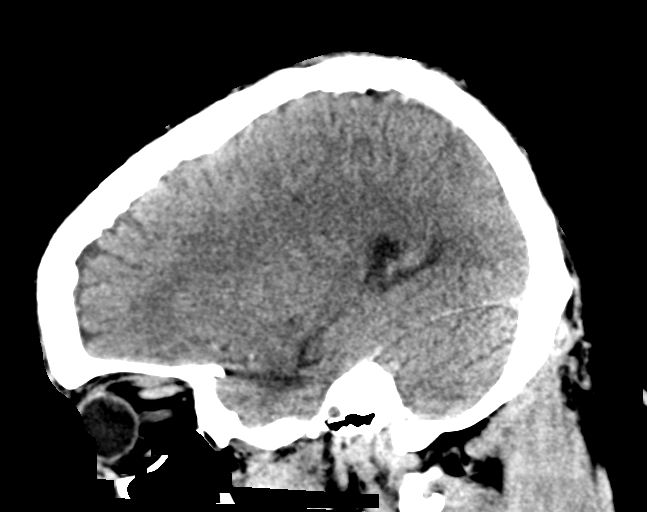

[16 of 47 positions shown; findings below may reference images not displayed]

FINDINGS: Brain: Round hyperdense hemorrhage centered at the left lentiform is
stable measuring 20 x 21 by 25 mm (AP by transverse by CC), 5 mL.
Mild regional edema and mass effect. Superimposed indistinct
hyperdensity at the medial inferior basal ganglia where vascular
calcifications are frequently noted.

No ventricular or extra-axial extension of blood. Left anterior
frontal and left lateral temporal lobe intracranial catheters are
stable, with no ventriculomegaly. No midline shift. Stable
gray-white matter differentiation throughout the brain. No acute
cortically based infarct identified. Basilar cisterns remain patent.

Vascular: No suspicious intracranial vascular hyperdensity.

Skull: Multifocal previous craniotomy and burr holes. No acute
osseous abnormality identified.

Sinuses/Orbits: Visualized paranasal sinuses and mastoids are stable
and well aerated.

Other: Multifocal chronic scalp postoperative changes. Chronic left
scalp shunt reservoir and tubing, which is disconnected to other
tubing in the left suboccipital region.
IMPRESSION: 1. Stable 5 mL left basal ganglia hemorrhage with mild regional
edema and mass effect. No complicating features.
2. Extensive prior craniotomies and CSF shunting. No
ventriculomegaly. The current intracranial catheters are
disconnected from other shunt tubing in the neck.
3. No new intracranial abnormality.

## 2021-11-07 MED ORDER — INSULIN ASPART 100 UNIT/ML IJ SOLN
0.0000 [IU] | INTRAMUSCULAR | Status: DC
  Administered 2021-11-07 – 2021-11-08 (×8): 2 [IU] via SUBCUTANEOUS
  Administered 2021-11-09: 3 [IU] via SUBCUTANEOUS
  Administered 2021-11-09 (×4): 2 [IU] via SUBCUTANEOUS
  Administered 2021-11-10: 3 [IU] via SUBCUTANEOUS
  Administered 2021-11-10 – 2021-11-12 (×8): 2 [IU] via SUBCUTANEOUS
  Administered 2021-11-13: 3 [IU] via SUBCUTANEOUS

## 2021-11-07 MED ORDER — CHLORHEXIDINE GLUCONATE 0.12 % MT SOLN
15.0000 mL | Freq: Two times a day (BID) | OROMUCOSAL | Status: DC
  Administered 2021-11-07 – 2021-11-13 (×12): 15 mL via OROMUCOSAL
  Filled 2021-11-07 (×5): qty 15

## 2021-11-07 MED ORDER — SODIUM CHLORIDE 0.9 % IV BOLUS: 500.0000 mL | Freq: Once | INTRAVENOUS | Status: DC

## 2021-11-07 MED ORDER — MORPHINE SULFATE (PF) 2 MG/ML IV SOLN: 1.0000 mg | INTRAVENOUS | Status: DC | PRN

## 2021-11-07 MED ORDER — CHLORHEXIDINE GLUCONATE CLOTH 2 % EX PADS
6.0000 | MEDICATED_PAD | Freq: Every day | CUTANEOUS | Status: DC
  Administered 2021-11-07 – 2021-11-13 (×7): 6 via TOPICAL

## 2021-11-07 MED ORDER — CLEVIDIPINE BUTYRATE 0.5 MG/ML IV EMUL: 0.0000 mg/h | INTRAVENOUS | Status: DC

## 2021-11-07 MED ORDER — WHITE PETROLATUM EX OINT: TOPICAL_OINTMENT | CUTANEOUS | Status: DC | PRN

## 2021-11-07 MED ORDER — FUROSEMIDE 10 MG/ML IJ SOLN
40.0000 mg | Freq: Two times a day (BID) | INTRAMUSCULAR | Status: DC
  Administered 2021-11-07 – 2021-11-12 (×10): 40 mg via INTRAVENOUS
  Filled 2021-11-07 (×10): qty 4

## 2021-11-07 MED ORDER — ORAL CARE MOUTH RINSE
15.0000 mL | Freq: Two times a day (BID) | OROMUCOSAL | Status: DC
  Administered 2021-11-07 – 2021-11-13 (×12): 15 mL via OROMUCOSAL

## 2021-11-07 MED ORDER — FUROSEMIDE 10 MG/ML IJ SOLN
40.0000 mg | Freq: Two times a day (BID) | INTRAMUSCULAR | Status: DC
  Administered 2021-11-07: 40 mg via INTRAVENOUS
  Filled 2021-11-07: qty 4

## 2021-11-07 MED ORDER — DIPHENHYDRAMINE HCL 50 MG/ML IJ SOLN
12.5000 mg | Freq: Once | INTRAMUSCULAR | Status: AC
Start: 2021-11-07 — End: 2021-11-07
  Administered 2021-11-07: 12.5 mg via INTRAVENOUS
  Filled 2021-11-07: qty 1

## 2021-11-07 NOTE — Progress Notes (Signed)
Dr. Derry Lory made aware that patient's blood pressure was under parameters. Verbal orders to infuse a 500 mL normal saline bolus, should he stay below parameters of SBP 130-150. Patient's blood pressure returned to parameters before starting bolus. Dr. Derry Lory also made aware that patient required more stimulation to participate in neuro assessment right before 0700. Dr. Kendrick Fries of critical care also updated about patient by this RN. Patient maintaining oxygen saturation, and following commands and opening eyes, but patient's vent was alarming no patient effort.  ?

## 2021-11-07 NOTE — Progress Notes (Deleted)
SLP Cancellation Note ? ?Patient Details ?Name: Paul [name] ?MRN: [id number] ?DOB: 02/04/1986 ? ? ?Cancelled treatment:    SLP order received. Pt is currently still on the vent. Will follow up for evaluation post-extubation.  ?     ? ? ?[name] ?11/07/2021, 8:35 AM ?

## 2021-11-07 NOTE — Progress Notes (Addendum)
eLink Physician-Brief Progress Note ?Patient Name: Paul [name] ?DOB: [birth year] ?MRN: [id number] ? ? ?Date of Service ? 11/07/2021  ?HPI/Events of Note ? Patient admitted with hypertensive emergency, intracranial hemorrhage, and pulmonary edema secondary to non-compliance with antihypertensive medications, Patient with abnormal EKG.  ?eICU Interventions ? New Patient Evaluation. Dr. Derry Lory reviewed the EKG with cardiology and documented in the chart that cardiology  feels it is not significant, and it is present in prior EKG's.  ? ? ? ?  ? ?Migdalia Dk ?11/07/2021, 3:05 AM ?

## 2021-11-07 NOTE — Progress Notes (Signed)
Inpatient Rehab Admissions Coordinator:  ? ?Per therapy recommendations,  patient was screened for CIR candidacy by Lorrie Strauch, MS, CCC-SLP. At this time, Pt. Appears to be a a potential candidate for CIR. I will place   order for rehab consult per protocol for full assessment. Please contact me any with questions. ? ?Riane Rung, MS, CCC-SLP ?Rehab Admissions Coordinator  ?[phone] (celll) ?[phone] (office) ? ?
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
## 2021-11-07 NOTE — Progress Notes (Signed)
Brief Neuro Update: ? ?Spoke with cards fellow Dr. Juliane Lack about his EKG with J point elevatoin. He reviewed the EKG, did not think this was concerning. This was present on prior EKGs. ? ?No recommendations per Cards at this time. ? ?Donnetta Simpers ?[facility] ?Pager Number [phone] ? ?

## 2021-11-07 NOTE — Progress Notes (Signed)
?  Echocardiogram ?2D Echocardiogram has been performed. ? ?Paul [name] ?11/07/2021, 2:48 PM ?

## 2021-11-07 NOTE — Progress Notes (Signed)
VO from Dr. Kendrick Fries to administer tonight's dose of lasix NOW and not administer tonight. Spoke with Selena Batten in pharmacy who is setting up the order to match this request. ?

## 2021-11-07 NOTE — Progress Notes (Signed)
VO from Dr. Roda Shutters for SBP <160. ?

## 2021-11-07 NOTE — Progress Notes (Signed)
Spoke to patient's father about his living situation. He lives with his mom who has MS. His sister and her children are also there. Sister works 3rd shift, kids are between 13-20. Mom is not bedbound but she was requiring pt to take her to appointments and shopping. ?Updated PT and OT with this information. ?

## 2021-11-07 NOTE — Evaluation (Signed)
Occupational Therapy Evaluation ?Patient Details ?Name: Paul [name] ?MRN: [id number] ?DOB: [birth year] ?Today's Date: 11/07/2021 ? ? ?History of Present Illness The pt is a 36 yo male presenting 3/18 with aphasia. CT revealed ICH of L basal ganglia, BP 262/176. During admission, pt developed resp distress and pulmonary edema, placed on Bipap. 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  ? ?Clinical Impression ?  ?This 36 yo male admitted with above presents to acute OT with PLOF of being totally independent with basic ADLs, IADLs, and working. Currently has limited use of RUE/RLE and decreased balance affecting his safety and independence with all aspects of life. He will benefit from continued acute OT with follow up on AIR recommended.  ?   ? ?Recommendations for follow up therapy are one component of a multi-disciplinary discharge planning process, led by the attending physician.  Recommendations may be updated based on patient status, additional functional criteria and insurance authorization.  ? ?Follow Up Recommendations ? Acute inpatient rehab (3hours/day)  ?  ?Assistance Recommended at Discharge Frequent or constant Supervision/Assistance  ?Patient can return home with the following A lot of help with walking and/or transfers;A lot of help with bathing/dressing/bathroom;Help with stairs or ramp for entrance;Assist for transportation;Assistance with feeding;Direct supervision/assist for financial management;Direct supervision/assist for medications management ? ?  ?Functional Status Assessment ? Patient has had a recent decline in their functional status and demonstrates the ability to make significant improvements in function in a reasonable and predictable amount of time.  ?Equipment Recommendations ? Other (comment) (TBD next venue)  ?  ?   ?Precautions / Restrictions Precautions ?Precautions: Fall ?Precaution Comments: BP < 160, 10L  HFNC ?Restrictions ?Weight Bearing Restrictions: No  ? ?  ? ?Mobility Bed Mobility ?Overal bed mobility: Needs Assistance ?Bed Mobility: Sit to Supine ?  ?  ?  ?Sit to supine: Min guard ?  ?General bed mobility comments: increased time to get RLE onto bed ?  ? ?Transfers ?Overall transfer level: Needs assistance ?Equipment used: 1 person hand held assist ?Transfers: Sit to/from Stand, Bed to chair/wheelchair/BSC ?Sit to Stand: Min assist ?Stand pivot transfers: Min assist ?  ?Step pivot transfers: Min assist ?  ?  ?  ?  ? ?  ?Balance Overall balance assessment: Needs assistance ?Sitting-balance support: No upper extremity supported, Feet supported ?Sitting balance-Leahy Scale: Good ?  ?  ?Standing balance support: Single extremity supported ?Standing balance-Leahy Scale: Poor ?  ?  ?  ?  ?  ?  ?  ?  ?  ?  ?  ?  ?   ? ?ADL either performed or assessed with clinical judgement  ? ?ADL Overall ADL's : Needs assistance/impaired ?Eating/Feeding: NPO ?  ?Grooming: Moderate assistance;Sitting ?  ?Upper Body Bathing: Moderate assistance;Sitting ?  ?Lower Body Bathing: Maximal assistance ?Lower Body Bathing Details (indicate cue type and reason): min A sit<.>stand ?Upper Body Dressing : Maximal assistance;Sitting ?  ?Lower Body Dressing: Maximal assistance ?Lower Body Dressing Details (indicate cue type and reason): min A sit<>stand ?Toilet Transfer: Minimal assistance;Stand-pivot ?Toilet Transfer Details (indicate cue type and reason): simulated recliner to bed going to pt's left ?Toileting- Clothing Manipulation and Hygiene: Total assistance ?Toileting - Clothing Manipulation Details (indicate cue type and reason): min A sit<>stand ?  ?  ?  ?   ? ? ? ?Vision Baseline Vision/History:  (Right eye does not track all the way right (pre-exsisting)) ?Additional Comments: Pt does not  report any double vision  ?   ?   ?   ? ?Pertinent Vitals/Pain Pain Assessment ?Pain Assessment: No/denies pain  ? ? ? ?Hand Dominance Right ?   ?Extremity/Trunk Assessment Upper Extremity Assessment ?Upper Extremity Assessment: RUE deficits/detail ?RUE Deficits / Details: 2/5 shoulder flexion, 3-/5 for elbow flexion, 2/5 forearm supination, 2/5 wrist extension, 3/5 finger flexion/extension, can oppose all digits. All movements take increased time and effort ?RUE Coordination: decreased fine motor;decreased gross motor ?  ? ?  ?Communication Communication ?Communication: Expressive difficulties ?  ?Cognition Arousal/Alertness: Awake/alert ?Behavior During Therapy: Flat affect ?Overall Cognitive Status: Difficult to assess ?  ?  ?  ?  ?  ?  ?  ?  ?  ?  ?  ?  ?  ?  ?  ?  ?General Comments: expressive difficulties, uses phone to text communication at times ?  ?  ?General Comments  VSS on 10L HFNC. SpO2 90-95%  ? ?  ?   ?   ? ? ?Home Living Family/patient expects to be discharged to:: Inpatient rehab ?Living Arrangements: Alone ?Available Help at Discharge: Family;Available 24 hours/day ?  ?  ?  ?  ?  ?  ?  ?Bathroom Shower/Tub: Tub/shower unit ?  ?  ?  ?  ?  ?  ?Additional Comments: Pt shook head to "yes" lives alone and "yes" to someone could come stay with him. No to other questions. ?  ? ?  ?Prior Functioning/Environment Prior Level of Function : Independent/Modified Independent;Working/employed;Driving ?  ?  ?  ?  ?  ?  ?Mobility Comments: Pt shook head "yes" to do you work ?ADLs Comments: pt reports independent ?  ? ?  ?  ?OT Problem List: Decreased strength;Decreased range of motion;Impaired balance (sitting and/or standing);Impaired vision/perception;Impaired UE functional use;Decreased coordination ?  ?   ?OT Treatment/Interventions: Self-care/ADL training;DME and/or AE instruction;Patient/family education;Balance training;Therapeutic activities;Therapeutic exercise  ?  ?OT Goals(Current goals can be found in the care plan section) Acute Rehab OT Goals ?Patient Stated Goal: unable to state, but shake head yes and no appropriately ?OT Goal Formulation:  With patient ?Time For Goal Achievement: 11/21/21 ?Potential to Achieve Goals: Good  ?OT Frequency: Min 2X/week ?  ? ?   ?AM-PAC OT "6 Clicks" Daily Activity     ?Outcome Measure Help from another person eating meals?: Total ?Help from another person taking care of personal grooming?: A Lot ?Help from another person toileting, which includes using toliet, bedpan, or urinal?: A Lot ?Help from another person bathing (including washing, rinsing, drying)?: A Lot ?Help from another person to put on and taking off regular upper body clothing?: A Lot ?Help from another person to put on and taking off regular lower body clothing?: A Lot ?6 Click Score: 11 ?  ?End of Session Equipment Utilized During Treatment: Gait belt ? ?Activity Tolerance: Patient tolerated treatment well ?Patient left: in bed;with call bell/phone within reach;with bed alarm set ? ?OT Visit Diagnosis: Other abnormalities of gait and mobility (R26.89);Muscle weakness (generalized) (M62.81);Hemiplegia and hemiparesis ?Hemiplegia - Right/Left: Right ?Hemiplegia - dominant/non-dominant: Dominant ?Hemiplegia - caused by: Other Nontraumatic intracranial hemorrhage  ?              ?Time: [id number] ?OT Time Calculation (min): 18 min ?Charges:  OT General Charges ?$OT Visit: 1 Visit ?OT Evaluation ?$OT Eval Moderate Complexity: 1 Mod ? ?[place], OTR/L ?Acute Rehab Services ?Pager [phone] ?Office [phone] ? ? ? ?Almon Register ?11/07/2021, 2:40 PM ?

## 2021-11-07 NOTE — ED Notes (Signed)
Additional 40 of lasix given - verbal by Palumbo  ?

## 2021-11-07 NOTE — ED Notes (Signed)
Pt on Bipap and aphasic at this time - Swallow screen deferred  ?

## 2021-11-07 NOTE — ED Notes (Signed)
Pt WOB improving with Bipap - pt agrees that he is breathing better at this time  ?

## 2021-11-07 NOTE — ED Notes (Signed)
Family visiting at bedside before pt goes up to ICU  ?
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
## 2021-11-07 NOTE — Code Documentation (Signed)
Responded to Code Stroke called at 2248 for aphasia, [id number]. Pt arrived at 2258, CBG-100, NIH-11, CT head-acute IPH in L basal ganglia. Pt given 20mg  labetalol in CT and started on Cleviprex gtt for BP-262/176. Pt then taken back to ED room. Cleviprex eventually titrated to max dose of 32mg  and SBP still >200s. 20mg  labetalol given IV and orders for labetalol gtt ordered. Plan to admit to ICU.  ?

## 2021-11-07 NOTE — ED Notes (Signed)
Mindy RN confirmed with neuro that we are giving labetalol and cleviprex  ?

## 2021-11-07 NOTE — Evaluation (Signed)
Physical Therapy Evaluation ?Patient Details ?Name: Paul [name] ?MRN: [id number] ?DOB: 07-08-1986 ?Today's Date: 11/07/2021 ? ?History of Present Illness ? The pt is a 36 yo male presenting 3/18 with aphasia. CT revealed ICH of L basal ganglia, BP 262/176. During admission, pt developed resp distress and pulmonary edema, placed on Bipap. 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 ?  ?Clinical Impression ? Pt in bed upon arrival of PT, agreeable to evaluation at this time. Prior to admission the pt was completely independent, working in a distribution center. The pt now presents with limitations in functional mobility, strength, power, and dynamic stability due to above dx, and will continue to benefit from skilled PT to address these deficits. He required increased time and effort to complete bed mobility, and then minA of 2 to stand from EOB. He requires up to [facility] of 2 to complete pivotal steps to recliner and to stand from low recliner due to poor LE power and his height. Given mobility deficits and pt's prior level of independence, recommend acute inpatient rehab after d/c to maximize functional recovery and facilitate return to functional independence.  ?   ?   ? ?Recommendations for follow up therapy are one component of a multi-disciplinary discharge planning process, led by the attending physician.  Recommendations may be updated based on patient status, additional functional criteria and insurance authorization. ? ?Follow Up Recommendations Acute inpatient rehab (3hours/day) ? ?  ?Assistance Recommended at Discharge Frequent or constant Supervision/Assistance  ?Patient can return home with the following ? Two people to help with walking and/or transfers;A lot of help with bathing/dressing/bathroom;Assistance with cooking/housework;Direct supervision/assist for medications management;Assist for transportation;Help with stairs or ramp for  entrance ? ?  ?Equipment Recommendations  (defer to post acute)  ?Recommendations for Other Services ? Rehab consult  ?  ?Functional Status Assessment Patient has had a recent decline in their functional status and demonstrates the ability to make significant improvements in function in a reasonable and predictable amount of time.  ? ?  ?Precautions / Restrictions Precautions ?Precautions: Fall ?Precaution Comments: BP < 160, 13L HFNC ?Restrictions ?Weight Bearing Restrictions: No  ? ?  ? ?Mobility ? Bed Mobility ?Overal bed mobility: Needs Assistance ?Bed Mobility: Rolling, Sidelying to Sit ?Rolling: Supervision ?Sidelying to sit: Supervision, HOB elevated ?  ?  ?  ?General bed mobility comments: HOB elevated but pt able to complete with increased time and effort ?  ? ?Transfers ?Overall transfer level: Needs assistance ?Equipment used: 2 person hand held assist ?Transfers: Sit to/from Stand, Bed to chair/wheelchair/BSC ?Sit to Stand: Min assist, +2 physical assistance ?  ?Step pivot transfers: Mod assist, +2 physical assistance ?  ?  ?  ?General transfer comment: minA from EOB and modA from low recliner to stand. small lateral steps to recliner wtih BUE support and trunk support to steady ?  ? ?Ambulation/Gait ?  ?  ?  ?  ?  ?  ?  ?General Gait Details: limited to lateral steps with modA of 2 to pivot ? ? ?Modified Rankin (Stroke Patients Only) ?Modified Rankin (Stroke Patients Only) ?Pre-Morbid Rankin Score: No symptoms ?Modified Rankin: Moderately severe disability ? ?  ? ?Balance Overall balance assessment: Needs assistance ?Sitting-balance support: Single extremity supported, Feet supported ?Sitting balance-Leahy Scale: Good ?  ?  ?Standing balance support: Bilateral upper extremity supported, During functional activity ?Standing balance-Leahy Scale: Poor ?Standing balance comment: dependent on support from staff  to stand and steady ?  ?  ?  ?  ?  ?  ?  ?  ?  ?  ?  ?   ? ? ? ?Pertinent Vitals/Pain Pain  Assessment ?Pain Assessment: No/denies pain  ? ? ?Home Living Family/patient expects to be discharged to:: Inpatient rehab ?  ?  ?  ?  ?  ?  ?  ?  ?  ?Additional Comments: difficult to discern given aphasia, pt nodding yes to living alone, typed out "in grade school only" when asked if he had any family or friends available to assist after d/c. will continue to assses  ?  ?Prior Function Prior Level of Function : Independent/Modified Independent;Working/employed;Driving ?  ?  ?  ?  ?  ?  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  ? ? ?Hand Dominance  ? Dominant Hand: Right ? ?  ?Extremity/Trunk Assessment  ? Upper Extremity Assessment ?Upper Extremity Assessment: Defer to OT evaluation;RUE deficits/detail ?RUE Deficits / Details: 3-/5 at elbow with limited grip strength and minimal movement at shoulder. pt denies pain or changes in sensation ?RUE Coordination: decreased fine motor;decreased gross motor ?  ? ?Lower Extremity Assessment ?Lower Extremity Assessment: Overall WFL for tasks assessed (grossly 4/5 to MMT, pt with good ability to stand from low surface) ?  ? ?Cervical / Trunk Assessment ?Cervical / Trunk Assessment: Other exceptions ?Cervical / Trunk Exceptions: large body habitus  ?Communication  ? Communication: Expressive difficulties  ?Cognition Arousal/Alertness: Awake/alert ?Behavior During Therapy: Flat affect ?Overall Cognitive Status: Difficult to assess ?  ?  ?  ?  ?  ?  ?  ?  ?  ?  ?  ?  ?  ?  ?  ?  ?General Comments: difficult to fully discern due to aphasia. pt following commands briskly and answering questions well in session ?  ?  ? ?  ?General Comments General comments (skin integrity, edema, etc.): VSS on 12L HFNC. SpO2 88-98% SBP remained < 160 ? ?  ?   ? ?Assessment/Plan  ?  ?PT Assessment Patient needs continued PT services  ?PT Problem List Decreased strength;Decreased range of motion;Decreased activity  tolerance;Decreased balance;Decreased mobility;Decreased safety awareness ? ?   ?  ?PT Treatment Interventions DME instruction;Gait training;Stair training;Functional mobility training;Therapeutic activities;Therapeutic exercise;Balance training;Neuromuscular re-education;Patient/family education   ? ?PT Goals (Current goals can be found in the Care Plan section)  ?Acute Rehab PT Goals ?Patient Stated Goal: to get back to bed ?PT Goal Formulation: With patient ?Time For Goal Achievement: 11/21/21 ?Potential to Achieve Goals: Good ? ?  ?Frequency Min 4X/week ?  ? ? ?   ?AM-PAC PT "6 Clicks" Mobility  ?Outcome Measure Help needed turning from your back to your side while in a flat bed without using bedrails?: A Little ?Help needed moving from lying on your back to sitting on the side of a flat bed without using bedrails?: A Little ?Help needed moving to and from a bed to a chair (including a wheelchair)?: A Lot ?Help needed standing up from a chair using your arms (e.g., wheelchair or bedside chair)?: A Lot ?Help needed to walk in hospital room?: Total ?Help needed climbing 3-5 steps with a railing? : Total ?6 Click Score: 12 ? ?  ?End of Session Equipment Utilized During Treatment: Gait belt;Oxygen ?Activity Tolerance: Patient tolerated treatment well ?Patient left: in chair;with call bell/phone within reach;with chair alarm set ?Nurse Communication:  Mobility status ?PT Visit Diagnosis: Unsteadiness on feet (R26.81);Other abnormalities of gait and mobility (R26.89);Muscle weakness (generalized) (M62.81);Hemiplegia and hemiparesis ?Hemiplegia - Right/Left: Right ?Hemiplegia - dominant/non-dominant: Dominant ?Hemiplegia - caused by: Nontraumatic intracerebral hemorrhage ?  ? ?Time: 1201-1222 ?PT Time Calculation (min) (ACUTE ONLY): 21 min ? ? ?Charges:   PT Evaluation ?$PT Eval Moderate Complexity: 1 Mod ?  ?  ?   ? ? ?West Carbo, PT, DPT  ? ?Acute Rehabilitation Department ?Pager #: [phone] ? ?Sandra Cockayne ?11/07/2021, 2:15 PM ? ?

## 2021-11-08 ENCOUNTER — Encounter (HOSPITAL_COMMUNITY): Payer: No Typology Code available for payment source

## 2021-11-08 ENCOUNTER — Inpatient Hospital Stay (HOSPITAL_COMMUNITY): Payer: Medicaid Other

## 2021-11-08 DIAGNOSIS — J9601 Acute respiratory failure with hypoxia: Secondary | ICD-10-CM

## 2021-11-08 DIAGNOSIS — I160 Hypertensive urgency: Secondary | ICD-10-CM

## 2021-11-08 DIAGNOSIS — J810 Acute pulmonary edema: Secondary | ICD-10-CM

## 2021-11-08 DIAGNOSIS — I161 Hypertensive emergency: Secondary | ICD-10-CM

## 2021-11-08 DIAGNOSIS — D751 Secondary polycythemia: Secondary | ICD-10-CM

## 2021-11-08 LAB — COMPREHENSIVE METABOLIC PANEL
ALT: 15 U/L (ref 0–44)
AST: 20 U/L (ref 15–41)
Albumin: 3.4 g/dL — ABNORMAL LOW (ref 3.5–5.0)
Alkaline Phosphatase: 41 U/L (ref 38–126)
Anion gap: 11 (ref 5–15)
BUN: 22 mg/dL — ABNORMAL HIGH (ref 6–20)
CO2: 25 mmol/L (ref 22–32)
Calcium: 8.8 mg/dL — ABNORMAL LOW (ref 8.9–10.3)
Chloride: 103 mmol/L (ref 98–111)
Creatinine, Ser: 2.31 mg/dL — ABNORMAL HIGH (ref 0.61–1.24)
GFR, Estimated: 37 mL/min — ABNORMAL LOW (ref >60)
Glucose, Bld: 119 mg/dL — ABNORMAL HIGH (ref 70–99)
Potassium: 3.6 mmol/L (ref 3.5–5.1)
Sodium: 139 mmol/L (ref 135–145)
Total Bilirubin: 1.3 mg/dL — ABNORMAL HIGH (ref 0.3–1.2)
Total Protein: 6.4 g/dL — ABNORMAL LOW (ref 6.5–8.1)

## 2021-11-08 LAB — CBC
HCT: 53.2 % — ABNORMAL HIGH (ref 39.0–52.0)
Hemoglobin: 17.8 g/dL — ABNORMAL HIGH (ref 13.0–17.0)
MCH: 27.4 pg (ref 26.0–34.0)
MCHC: 33.5 g/dL (ref 30.0–36.0)
MCV: 81.8 fL (ref 80.0–100.0)
Platelets: 189 10*3/uL (ref 150–400)
RBC: 6.5 MIL/uL — ABNORMAL HIGH (ref 4.22–5.81)
RDW: 15.1 % (ref 11.5–15.5)
WBC: 16.5 10*3/uL — ABNORMAL HIGH (ref 4.0–10.5)
nRBC: 0 % (ref 0.0–0.2)

## 2021-11-08 LAB — GLUCOSE, CAPILLARY
Glucose-Capillary: 106 mg/dL — ABNORMAL HIGH (ref 70–99)
Glucose-Capillary: 122 mg/dL — ABNORMAL HIGH (ref 70–99)
Glucose-Capillary: 123 mg/dL — ABNORMAL HIGH (ref 70–99)
Glucose-Capillary: 134 mg/dL — ABNORMAL HIGH (ref 70–99)
Glucose-Capillary: 135 mg/dL — ABNORMAL HIGH (ref 70–99)
Glucose-Capillary: 98 mg/dL (ref 70–99)

## 2021-11-08 LAB — ERYTHROPOIETIN: Erythropoietin: 5.7 m[IU]/mL (ref 2.6–18.5)

## 2021-11-08 LAB — RPR: RPR Ser Ql: NONREACTIVE

## 2021-11-08 IMAGING — DX DG CHEST 1V PORT
1 series · 1 of 1 positions shown · non-contrast
Comparison: [DATE]

CLINICAL DATA: Pulmonary edema

EXAM:
PORTABLE CHEST 1 VIEW

[chest]
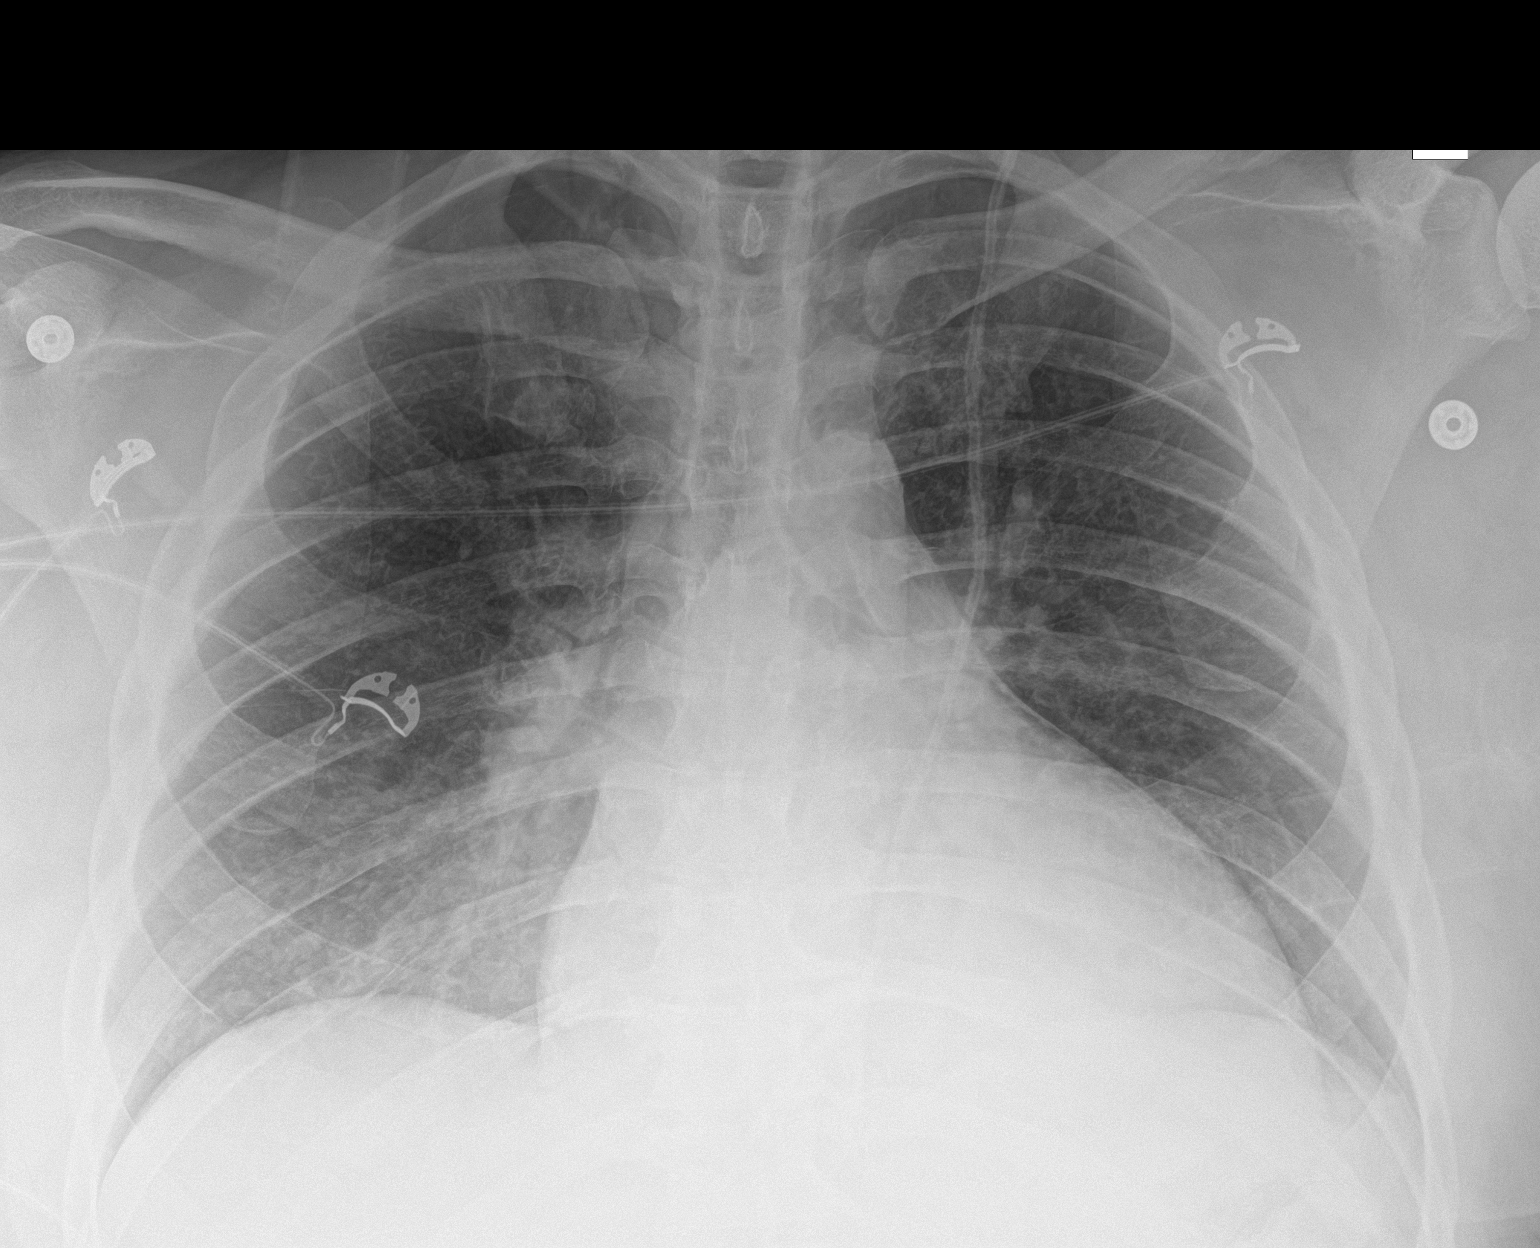

[1 of 1 positions shown; findings below may reference images not displayed]

FINDINGS: Stable mild cardiomegaly. Subtle lucency along the proximal left
heart border may represent a small amount of pneumopericardium, not
seen on the previous exam. Similar opacities at the right lung base.
No overt edema. No large pleural fluid collection. Interval
development of a small right apical pneumothorax, less than 5%
volume.
IMPRESSION: 1. Interval development of a small right apical pneumothorax, less
than 5% volume.
2. Subtle lucency along the proximal left heart border may represent
a small amount of pneumopericardium, not seen on the previous exam.
3. Persistent right basilar opacity.

These results will be called to the ordering clinician or
representative by the Radiologist Assistant, and communication
documented in the PACS or [REDACTED].

## 2021-11-08 IMAGING — DX DG ABDOMEN 1V
1 series · 2 of 2 positions shown · non-contrast
Comparison: None.

CLINICAL DATA: Feeding tube placement

EXAM:
ABDOMEN - 1 VIEW

[Series 1: abdomen · 0.14mm/px · 2 of 2 slices shown]
[im 1/2]
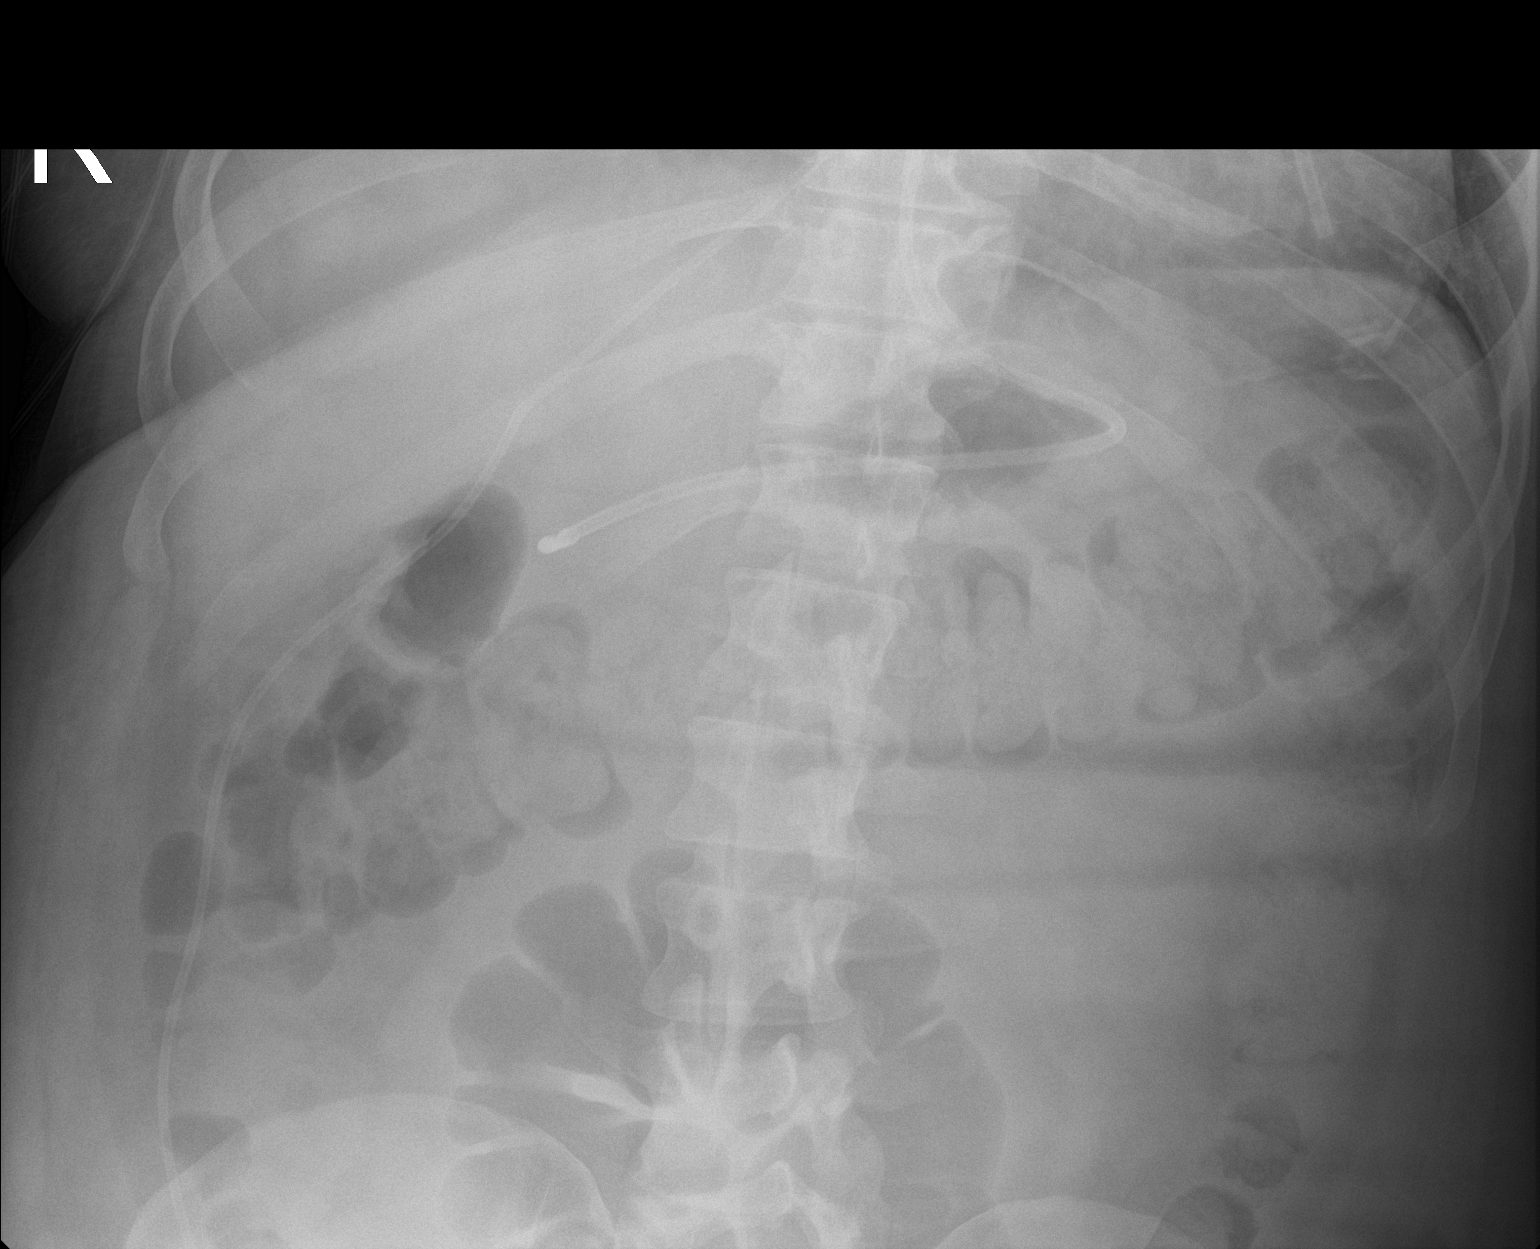
[im 2/2]
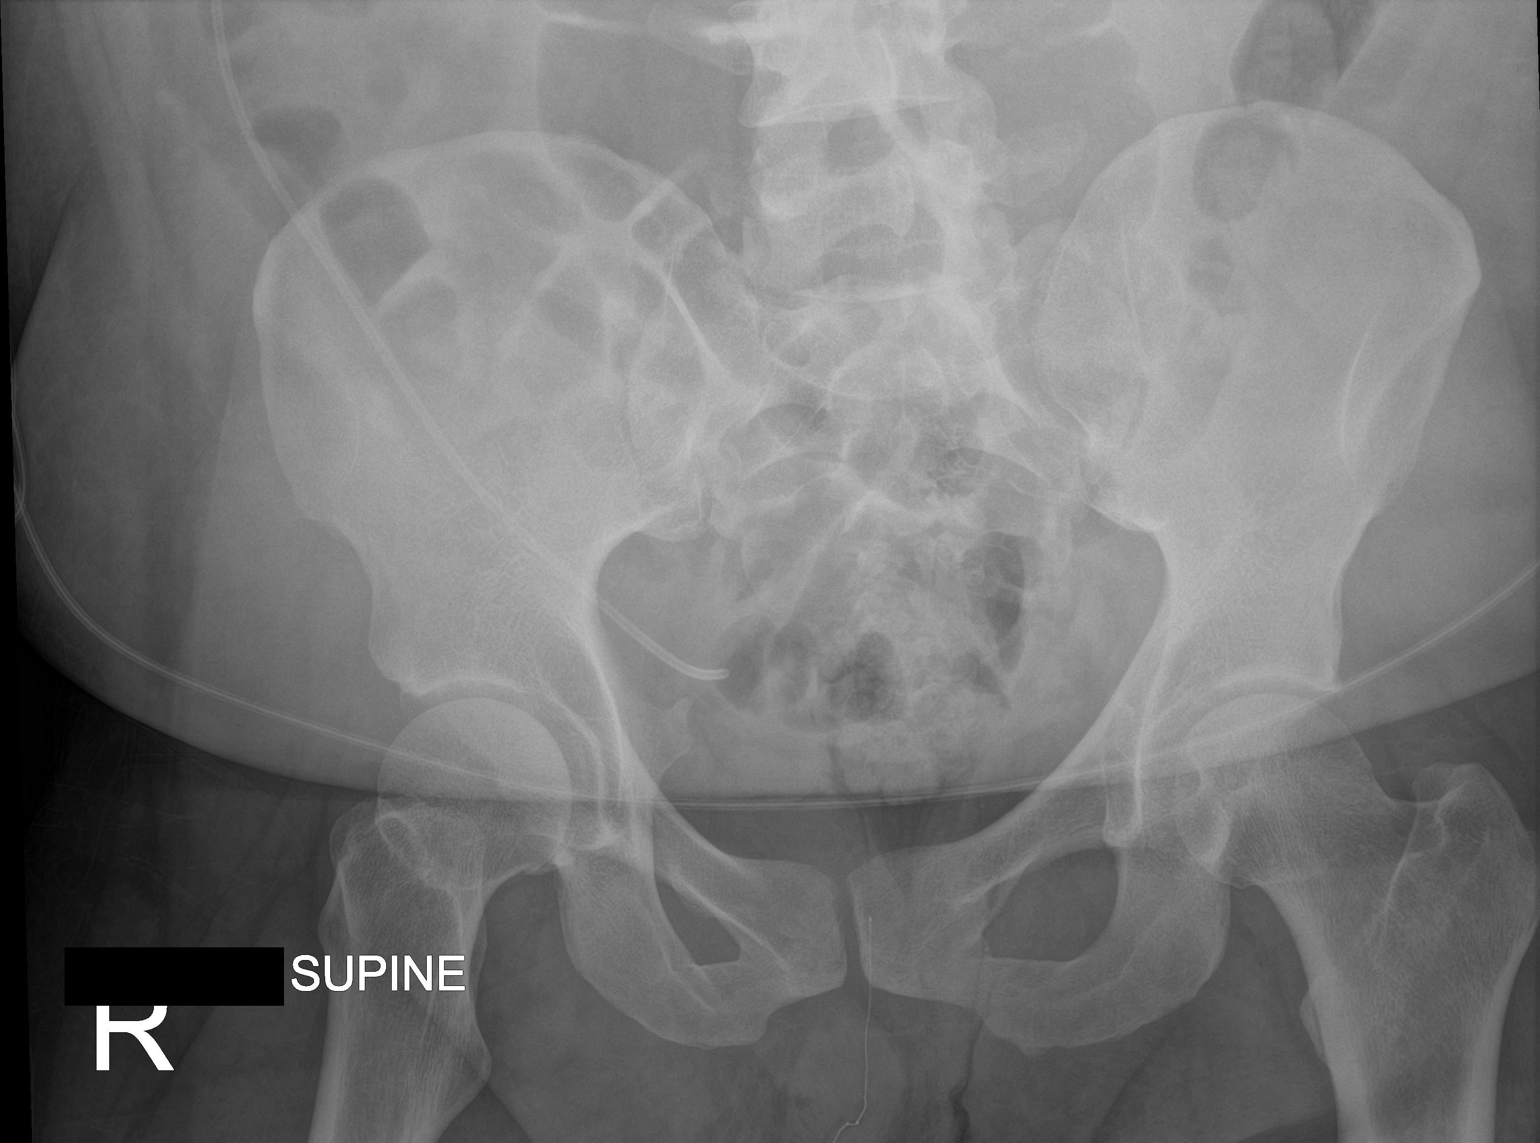

[2 of 2 positions shown; findings below may reference images not displayed]

FINDINGS: Feeding tube tip projects over the expected area the distal stomach.
Nonobstructive bowel gas pattern. Partially visualized VP shunt
catheter tubing with tip terminating in the pelvis. No acute osseous
abnormality.
IMPRESSION: Feeding tube tip projects over the expected area the distal stomach.

## 2021-11-08 IMAGING — DX DG CHEST 1V PORT
1 series · 1 of 1 positions shown · non-contrast
Comparison: Chest x-ray [DATE], chest x-ray [DATE]

CLINICAL DATA: Patient states he feels fluid in chest when
breathing today, stroke patient

EXAM:
PORTABLE CHEST 1 VIEW

[chest]
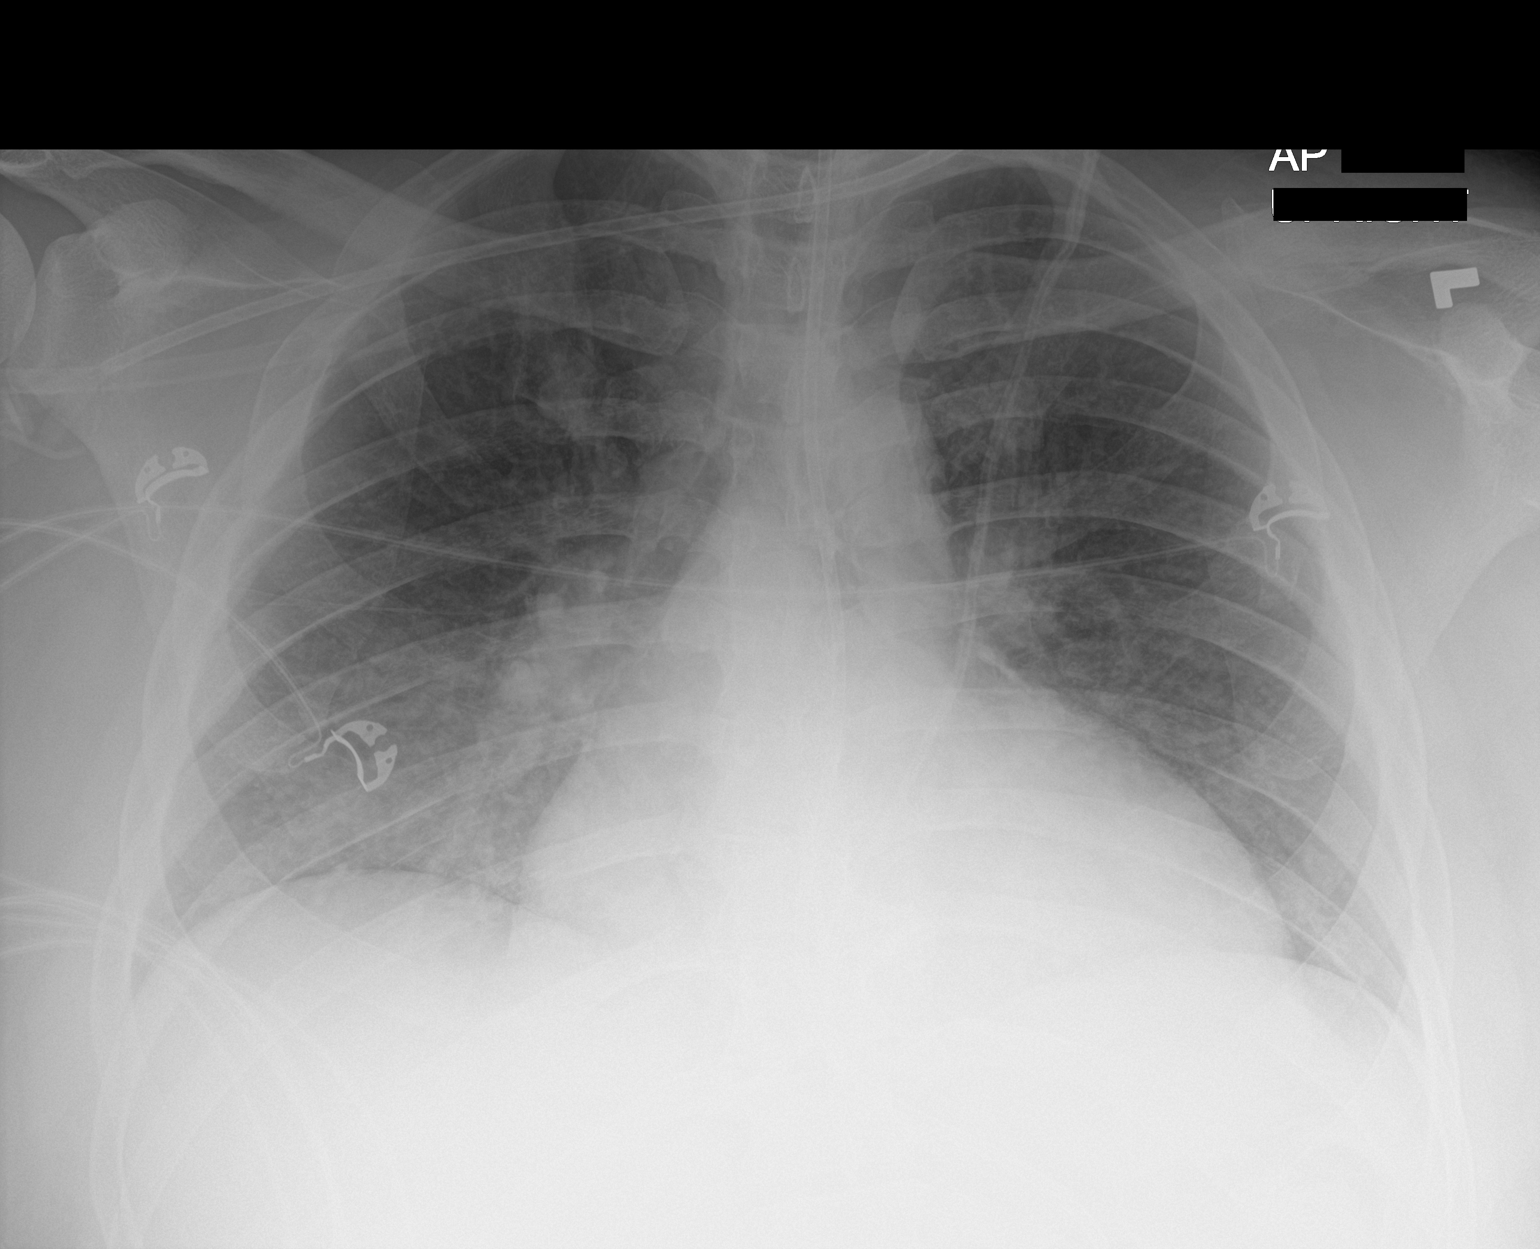

[1 of 1 positions shown; findings below may reference images not displayed]

FINDINGS: Lines and tubes overlie the patient. Ventriculoperitoneal shunt
courses along the left chest wall.

Enlarged cardiac silhouette with slightly rounded appearance. The
heart and mediastinal contours are unchanged. Prominent hilar
vasculature.

No focal consolidation. Persistent slightly increased interstitial
markings. No pleural effusion. No pneumothorax.

No acute osseous abnormality.
IMPRESSION: 1. Improving pulmonary edema.
2. Enlarged cardiac silhouette with underlying pericardial effusion
not excluded.

## 2021-11-08 MED ORDER — PROSOURCE TF PO LIQD: 45.0000 mL | Freq: Three times a day (TID) | ORAL | Status: DC

## 2021-11-08 MED ORDER — POTASSIUM CHLORIDE 10 MEQ/100ML IV SOLN
INTRAVENOUS | Status: AC
  Filled 2021-11-08: qty 100

## 2021-11-08 MED ORDER — AMLODIPINE BESYLATE 10 MG PO TABS
10.0000 mg | ORAL_TABLET | Freq: Every day | ORAL | Status: DC
  Administered 2021-11-08 – 2021-11-13 (×6): 10 mg
  Filled 2021-11-08 (×6): qty 1

## 2021-11-08 MED ORDER — SENNOSIDES-DOCUSATE SODIUM 8.6-50 MG PO TABS
1.0000 | ORAL_TABLET | Freq: Two times a day (BID) | ORAL | Status: DC
  Administered 2021-11-08: 1
  Filled 2021-11-08: qty 1

## 2021-11-08 MED ORDER — POTASSIUM CHLORIDE 10 MEQ/100ML IV SOLN
10.0000 meq | INTRAVENOUS | Status: AC
  Administered 2021-11-08 (×2): 10 meq via INTRAVENOUS
  Filled 2021-11-08 (×2): qty 100

## 2021-11-08 MED ORDER — OSMOLITE 1.5 CAL PO LIQD
1000.0000 mL | ORAL | Status: DC
Start: 2021-11-08 — End: 2021-11-13
  Administered 2021-11-08 – 2021-11-13 (×5): 1000 mL

## 2021-11-08 MED ORDER — METOPROLOL TARTRATE 50 MG PO TABS
50.0000 mg | ORAL_TABLET | Freq: Two times a day (BID) | ORAL | Status: DC
  Administered 2021-11-08: 50 mg
  Filled 2021-11-08: qty 1

## 2021-11-08 MED ORDER — OSMOLITE 1.5 CAL PO LIQD: 1000.0000 mL | ORAL | Status: DC
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
## 2021-11-08 NOTE — Evaluation (Signed)
Clinical/Bedside Swallow Evaluation ?Patient Details  ?Name: Paul [name] ?MRN: [id number] ?Date of Birth: Apr 19, 1986 ? ?Today's Date: 11/08/2021 ?Time: SLP Start Time (ACUTE ONLY): 1616 SLP Stop Time (ACUTE ONLY): 1630 ?SLP Time Calculation (min) (ACUTE ONLY): 14 min ? ?Past Medical History:  ?Past Medical History:  ?Diagnosis Date  ? Exposure to STD 02/04/2020  ? Hypertension   ? ?Past Surgical History:  ?Past Surgical History:  ?Procedure Laterality Date  ? EYE SURGERY    ? HEMATOMA EVACUATION    ? 2003  ? PEG TUBE PLACEMENT    ? VENTRICULOPERITONEAL SHUNT    ? ?HPI:  ?The pt is a 36 yo male presenting 3/18 with inability to talk. CT revealed ICH of L basal ganglia, BP 262/176. During admission, pt developed resp distress and pulmonary edema, placed on BiPAP. 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  ?  ?Assessment / Plan / Recommendation  ?Clinical Impression ? Pt has minimal movement of his lips, mandible, and tongue bilaterally although with slightly increased facial movement on the L side. He is audibly wet at baseline with concern for mismanagement of secretions. Although he can swallow to command as judged by hyolaryngeal movement to palpation, the integrity of his swallow is questioned. He cannot cough to command, and when he coughs more spontaneously, he has a difficult time getting anything into his mouth in order to expectorate. He has very limited mandibular opemning but his dentition parts just enough for a yankauer to get through, but minimal secretions are obtained this way as well. Given severity of impairments noted and considering overall respiratory status, PO trials were not administered today. Will continue to follow, recommending intensive SLP f/u upon discharge. ?SLP Visit Diagnosis: Dysphagia, unspecified (R13.10) ?   ?Aspiration Risk ? Severe aspiration risk;Risk for inadequate nutrition/hydration  ?  ?Diet Recommendation  NPO;Alternative means - temporary  ? ?Medication Administration: Via alternative means  ?  ?Other  Recommendations Oral Care Recommendations: Oral care QID ?Other Recommendations: Have oral suction available   ? ?Recommendations for follow up therapy are one component of a multi-disciplinary discharge planning process, led by the attending physician.  Recommendations may be updated based on patient status, additional functional criteria and insurance authorization. ? ?Follow up Recommendations Acute inpatient rehab (3hours/day)  ? ? ?  ?Assistance Recommended at Discharge Intermittent Supervision/Assistance  ?Functional Status Assessment Patient has had a recent decline in their functional status and demonstrates the ability to make significant improvements in function in a reasonable and predictable amount of time.  ?Frequency and Duration min 2x/week  ?2 weeks ?  ?   ? ?Prognosis Prognosis for Safe Diet Advancement: Good ?Barriers to Reach Goals: Severity of deficits  ? ?  ? ?Swallow Study   ?General HPI: The pt is a 36 yo male presenting 3/18 with inability to talk. CT revealed ICH of L basal ganglia, BP 262/176. During admission, pt developed resp distress and pulmonary edema, placed on BiPAP. 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 ?Type of Study: Bedside Swallow Evaluation ?Previous Swallow Assessment: none in chart ?Diet Prior to this Study: NPO;NG Tube ?Temperature Spikes Noted: No ?Respiratory Status: Nasal cannula ?History of Recent Intubation: No ?Behavior/Cognition: Alert;Cooperative;Pleasant mood ?Oral Cavity Assessment:  (very limited visibility) ?Oral Care Completed by SLP: No ?Oral Cavity - Dentition: Adequate natural dentition ?Patient Positioning: Upright in bed ?Baseline Vocal Quality:  Wet ?Volitional Cough: Other (Comment) (unable to elicit volitionally, but does cough spontaneously) ?Volitional Swallow: Able to elicit  ?   ?Oral/Motor/Sensory Function Overall Oral Motor/Sensory Function: Severe impairment ?Facial ROM: Reduced right;Reduced left ?Facial Symmetry: Abnormal symmetry right ?Facial Strength: Reduced right;Reduced left ?Facial Sensation: Within Functional Limits ?Lingual ROM: Other (Comment) (minimal protrusion) ?Lingual Symmetry: Within Functional Limits ?Velum:  (UTA) ?Mandible: Impaired   ?Ice Chips Ice chips: Not tested   ?Thin Liquid Thin Liquid: Not tested  ?  ?Nectar Thick Nectar Thick Liquid: Not tested   ?Honey Thick Honey Thick Liquid: Not tested   ?Puree Puree: Not tested   ?Solid ? ? ?  Solid: Not tested  ? ?  ? ?Mahala Menghini., M.A. CCC-SLP ?Acute Rehabilitation Services ?Pager [phone] ?Office [phone] ? ?11/08/2021,5:05 PM ? ? ? ?

## 2021-11-08 NOTE — Progress Notes (Signed)
?  Transition of Care (TOC) Screening Note ? ? ?Patient Details  ?Name: Paul [name] ?Date of Birth: [birth year] ? ? ?Transition of Care (TOC) CM/SW Contact:    ?Mearl Latin, LCSW ?Phone Number: ?11/08/2021, 9:43 AM ? ? ? ?Transition of Care Department [facility]) has reviewed patient and no TOC needs have been identified at this time. We will continue to monitor patient advancement through interdisciplinary progression rounds. If new patient transition needs arise, please place a TOC consult. ? ? ?

## 2021-11-08 NOTE — Progress Notes (Signed)
Physical Therapy Treatment ?Patient Details ?Name: Paul [name] ?MRN: [id number] ?DOB: 06-08-1986 ?Today's Date: 11/08/2021 ? ? ?History of Present Illness The pt is a 36 yo male presenting 3/18 with aphasia. CT revealed ICH of L basal ganglia, BP 262/176. During admission, pt developed resp distress and pulmonary edema, placed on Bipap. CXR 3/20 shows Interval development of a small right apical pneumothorax, less than 5% volume. 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 ? ?  ?PT Comments  ? ? Pt resting upon PT arrival to room, wakes easily and agreeable to OOB mobility. PT focused session on repeated transfers with focus on form, pre-gait (stepping, marching), and dynamic standing balance. Pt overall requiring min-mod +2 assist for mobility at this time, pt reports being fatigued today but is eager to progress mobility and practice gait next session. PT to continue to follow.  ? ? ?SpO2 91% and greater on 10LO2 via [place]  ?   ?Recommendations for follow up therapy are one component of a multi-disciplinary discharge planning process, led by the attending physician.  Recommendations may be updated based on patient status, additional functional criteria and insurance authorization. ? ?Follow Up Recommendations ? Acute inpatient rehab (3hours/day) ?  ?  ?Assistance Recommended at Discharge Frequent or constant Supervision/Assistance  ?Patient can return home with the following A lot of help with bathing/dressing/bathroom;Assistance with cooking/housework;Direct supervision/assist for medications management;Assist for transportation;Help with stairs or ramp for entrance;A lot of help with walking and/or transfers ?  ?Equipment Recommendations ? Other (comment) (defer to post acute)  ?  ?Recommendations for Other Services Rehab consult ? ? ?  ?Precautions / Restrictions Precautions ?Precautions: Fall ?Precaution Comments: BP < 160, 10L HFNC (was on bipap  earlier today) ?Restrictions ?Weight Bearing Restrictions: No  ?  ? ?Mobility ? Bed Mobility ?Overal bed mobility: Needs Assistance ?Bed Mobility: Supine to Sit, Sit to Supine ?  ?  ?Supine to sit: Min assist, +2 for physical assistance ?Sit to supine: Mod assist, +2 for physical assistance ?  ?General bed mobility comments: min assist for supine>sit for trunk elevation and completion of scoot to EOB, mod +2 for return to supine for LE lifting and boost up in bed using bed pads and boost function. ?  ? ?Transfers ?Overall transfer level: Needs assistance ?Equipment used: 2 person hand held assist ?Transfers: Sit to/from Stand ?Sit to Stand: Mod assist, Min assist, +2 physical assistance ?  ?Step pivot transfers: +2 physical assistance, Mod assist ?  ?  ?  ?General transfer comment: Initially mod +2 for power up, rise, steadying, and correcting R knee buckle. x2 more sit<>stands with min assist +2 for power up and steadying. Lateral stepping, marching mod +2 for weight shift at hips, steadying. ?  ? ?Ambulation/Gait ?  ?  ?  ?  ?  ?  ?  ?  ? ? ?Stairs ?  ?  ?  ?  ?  ? ? ?Wheelchair Mobility ?  ? ?Modified Rankin (Stroke Patients Only) ?Modified Rankin (Stroke Patients Only) ?Pre-Morbid Rankin Score: No symptoms ?Modified Rankin: Moderately severe disability ? ? ?  ?Balance Overall balance assessment: Needs assistance ?Sitting-balance support: No upper extremity supported, Feet supported ?Sitting balance-Leahy Scale: Good ?  ?  ?Standing balance support: Bilateral upper extremity supported ?Standing balance-Leahy Scale: Poor ?  ?  ?  ?  ?  ?  ?  ?  ?  ?  ?  ?  ?  ? ?  ?  Cognition Arousal/Alertness: Awake/alert ?Behavior During Therapy: Flat affect ?Overall Cognitive Status: Difficult to assess ?  ?  ?  ?  ?  ?  ?  ?  ?  ?  ?  ?  ?  ?  ?  ?  ?General Comments: expressive difficulties, uses phone for text communication otherwise consistently nods yes/no to PT questions. R inattention, frequent cues to adjust R hand  placement ?  ?  ? ?  ?Exercises Shoulder Exercises ?Elbow Flexion: PROM, Right, 5 reps, Seated ?Elbow Extension: PROM, Right, 5 reps, Seated ?Wrist Flexion: PROM, Right, 5 reps, Seated (pt going into pronation with wrist flexion) ?Wrist Extension: PROM, Right, 5 reps, Seated ? ?  ?General Comments General comments (skin integrity, edema, etc.): 10LO2 via HFNC, SpO2 maintained 91% and greater. x3 productive coughs with sitting and standing tasks ?  ?  ? ?Pertinent Vitals/Pain Pain Assessment ?Pain Assessment: Faces ?Faces Pain Scale: Hurts little more ?Pain Location: throat from cortrack - more discomfort ?Pain Descriptors / Indicators: Discomfort, Nagging ?Pain Intervention(s): Limited activity within patient's tolerance, Monitored during session, Repositioned  ? ? ?Home Living   ?  ?  ?  ?  ?  ?  ?  ?  ?  ?   ?  ?Prior Function    ?  ?  ?   ? ?PT Goals (current goals can now be found in the care plan section) Acute Rehab PT Goals ?PT Goal Formulation: With patient ?Time For Goal Achievement: 11/21/21 ?Potential to Achieve Goals: Good ?Progress towards PT goals: Progressing toward goals ? ?  ?Frequency ? ? ? Min 4X/week ? ? ? ?  ?PT Plan Current plan remains appropriate  ? ? ?Co-evaluation   ?  ?  ?  ?  ? ?  ?AM-PAC PT "6 Clicks" Mobility   ?Outcome Measure ? Help needed turning from your back to your side while in a flat bed without using bedrails?: A Little ?Help needed moving from lying on your back to sitting on the side of a flat bed without using bedrails?: A Little ?Help needed moving to and from a bed to a chair (including a wheelchair)?: A Lot ?Help needed standing up from a chair using your arms (e.g., wheelchair or bedside chair)?: A Lot ?Help needed to walk in hospital room?: Total ?Help needed climbing 3-5 steps with a railing? : Total ?6 Click Score: 12 ? ?  ?End of Session Equipment Utilized During Treatment: Gait belt;Oxygen ?Activity Tolerance: Patient tolerated treatment well ?Patient left: with  call bell/phone within reach;in bed;with bed alarm set ?Nurse Communication: Mobility status ?PT Visit Diagnosis: Unsteadiness on feet (R26.81);Other abnormalities of gait and mobility (R26.89);Muscle weakness (generalized) (M62.81);Hemiplegia and hemiparesis ?Hemiplegia - Right/Left: Right ?Hemiplegia - dominant/non-dominant: Dominant ?Hemiplegia - caused by: Nontraumatic intracerebral hemorrhage ?  ? ? ?Time: [id number] ?PT Time Calculation (min) (ACUTE ONLY): 27 min ? ?Charges:  $Therapeutic Activity: 8-22 mins ?$Neuromuscular Re-education: 8-22 mins          ?          ? ?Stacie Glaze, PT DPT ?Acute Rehabilitation Services ?Pager [phone]  ?Office [phone] ? ? ? ?Ferguson Gertner E Stroup ?11/08/2021, 4:46 PM ? ?

## 2021-11-08 NOTE — Procedures (Signed)
Cortrak  Person Inserting Tube:  Jasyn Mey D, RD Tube Type:  Cortrak - 43 inches Tube Size:  10 Tube Location:  Left nare Secured by: Bridle Technique Used to Measure Tube Placement:  Marking at nare/corner of mouth Cortrak Secured At:  74 cm  Cortrak Tube Team Note:  Consult received to place a Cortrak feeding tube.   X-ray is required, abdominal x-ray has been ordered by the Cortrak team. Please confirm tube placement before using the Cortrak tube.   If the tube becomes dislodged please keep the tube and contact the Cortrak team at [email or web address] (password [email or web address]) for replacement.  If after hours and replacement cannot be delayed, place a NG tube and confirm placement with an abdominal x-ray.   Rutilio Yellowhair, RD, LDN Clinical Dietitian RD pager # available in AMION  After hours/weekend pager # available in AMION   
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
## 2021-11-08 NOTE — TOC CAGE-AID Note (Signed)
Transition of Care (TOC) - CAGE-AID Screening ? ? ?Patient Details  ?Name: Paul [name] ?MRN: [id number] ?Date of Birth: 1985/08/24 ? ?Transition of Care (TOC) CM/SW Contact:    ?Valyncia Wiens C Tarpley-Carter, LCSWA ?Phone Number: ?11/08/2021, 2:57 PM ? ? ?Clinical Narrative: ?Pt is unable to participate in Cage Aid. ?Pt is critically ill. ? ?Insurance underwriter, MSW, LCSW-A ?Pronouns:  She/Her/Hers ?[facility]itions of [facility] ?Clinical Social Worker ?Direct Number:  [phone] ?[email or web address] ? ?CAGE-AID Screening: ?Substance Abuse Screening unable to be completed due to: : Patient unable to participate (Pt is critically ill.) ? ?  ?  ?  ?  ?  ? ?Substance Abuse Education Offered: No ? ?  ? ? ? ? ? ? ?

## 2021-11-08 NOTE — Progress Notes (Signed)
SLP Cancellation Note ? ?Patient Details ?Name: Kathleene Hazel ?MRN: [id number] ?DOB: 1986-01-24 ? ? ?Cancelled treatment:       Reason Eval/Treat Not Completed: Patient not medically ready (on BiPAP). Will f/u for SLP evaluations as able. ? ? ? ?Mahala Menghini., M.A. CCC-SLP ?Acute Rehabilitation Services ?Pager [phone] ?Office [phone] ? ?11/08/2021, 10:41 AM ?

## 2021-11-08 NOTE — Progress Notes (Signed)
Initial Nutrition Assessment ? ?DOCUMENTATION CODES:  ? ?Not applicable ? ?INTERVENTION:  ? ?Tube Feeds via Cortrak: ?Start Osmolite 1.5 @ 60 mL/hr (1440 mL/day) ?45 mL ProSource TF - TID ?Provides 2280 kcal, 123 gm of protein, and 1097 mL free water daily.  ? ?NUTRITION DIAGNOSIS:  ? ?Inadequate oral intake related to inability to eat as evidenced by NPO status. ? ?GOAL:  ? ?Patient will meet greater than or equal to 90% of their needs ? ?MONITOR:  ? ?Diet advancement, Labs, TF tolerance ? ?REASON FOR ASSESSMENT:  ? ?Other (Comment) (Cortrak) ?  ? ?ASSESSMENT:  ? ?36 y.o. male presented to the ED after sudden inability to talk. PMH includes HTN, TBI, Hematoma w/ VP shunt. Pt admitted with left Basal Ganglia Hemorrhage, hypertension, and pulmonary edema.  ? ?3/20 - Cortrak placed (tip stomach) ? ?Pt resting in bed, does not wake to RD voice or touch. Spoke with pt father. ? ?Pt father stated that his appetite has been good at home. States that he has started to eat healthier within the past year. Father is unsure of UBW or if pt has had any recent weight loss.  ? ?Discussed Cortrak placement with father. Discussed that it will provide the pt with all the nutrition he needs. It will not impact the pt from working with SLP and swallowing.  ? ?Father with no other questions or concerns at this time.  ? ?Medications reviewed and include: Lasix, SSI 0-15 units, Zofran, Protonix, Senokot, Cleviprex (2304 kcal/day), IV potassium chloride  ?Labs reviewed. ? ?NUTRITION - FOCUSED PHYSICAL EXAM: ? ?Flowsheet Row Most Recent Value  ?Orbital Region No depletion  ?Upper Arm Region No depletion  ?Thoracic and Lumbar Region No depletion  ?Buccal Region No depletion  ?Temple Region No depletion  ?Clavicle Bone Region No depletion  ?Clavicle and Acromion Bone Region No depletion  ?Scapular Bone Region No depletion  ?Dorsal Hand No depletion  ?Patellar Region No depletion  ?Anterior Thigh Region No depletion  ?Posterior Calf Region No  depletion  ?Edema (RD Assessment) None  ?Hair Reviewed  ?Eyes Unable to assess  ?Mouth Unable to assess  ?Skin Reviewed  ?Nails Reviewed  ? ?Diet Order:   ?Diet Order   ? ?       ?  Diet NPO time specified  Diet effective midnight       ?  ?  Diet NPO time specified  Diet effective now       ?  ? ?  ?  ? ?  ? ? ?EDUCATION NEEDS:  ? ?Not appropriate for education at this time ? ?Skin:  Skin Assessment: Reviewed RN Assessment ? ?Last BM:  No Documentation ? ?Height:  ?Ht Readings from Last 1 Encounters:  ?11/07/21 6\' 3"  (1.905 m)  ? ?Weight:  ?Wt Readings from Last 1 Encounters:  ?11/07/21 (!) 137.9 kg  ? ?Ideal Body Weight:  89.1 kg ? ?BMI:  Body mass index is 38 kg/m?. ? ?Estimated Nutritional Needs:  ? ?Kcal:  2300-2500 ? ?Protein:  115-130 grams ? ?Fluid:  >/= 2.3 L ? ? ? ?11/09/21 RD, LDN ?Clinical Dietitian ?See AMiON for contact information.  ? ?

## 2021-11-08 NOTE — Evaluation (Signed)
Speech Language Pathology Evaluation ?Patient Details ?Name: Paul [name] ?MRN: [id number] ?DOB: [birth year] ?Today's Date: 11/08/2021 ?Time: 3976-7341 ?SLP Time Calculation (min) (ACUTE ONLY): 13 min ? ?Problem List:  ?Patient Active Problem List  ? Diagnosis Date Noted  ? Cerebrovascular accident (CVA) (HCC)   ? Flash pulmonary edema (HCC)   ? Nontraumatic acute hemorrhage of basal ganglia (HCC) 11/06/2021  ? No-show for appointment 10/30/2020  ? Elevated LDL cholesterol level 10/15/2020  ? Diabetes mellitus screening 10/15/2020  ? Hypertensive crisis 02/04/2020  ? Encounter for hepatitis C screening test for low risk patient 02/04/2020  ? Obesity 04/16/2014  ? Essential hypertension, benign 04/15/2014  ? 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 ?Past Medical History:  ?Past Medical History:  ?Diagnosis Date  ? Exposure to STD 02/04/2020  ? Hypertension   ? ?Past Surgical History:  ?Past Surgical History:  ?Procedure Laterality Date  ? EYE SURGERY    ? HEMATOMA EVACUATION    ? 2003  ? PEG TUBE PLACEMENT    ? VENTRICULOPERITONEAL SHUNT    ? ?HPI:  ?The pt is a 36 yo male presenting 3/18 with inability to talk. CT revealed ICH of L basal ganglia, BP 262/176. During admission, pt developed resp distress and pulmonary edema, placed on BiPAP. 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  ? ?Assessment / Plan / Recommendation ?Clinical Impression ? Pt presents with significant impairments in communication felt to be related to anarthria as opposed to linguistic impairments. His comprehension is appropriate through complex yes/no questions and two-step commands. He responds appropriately in conversation and exhibits promising cognitive skills as he problem solves with use of multimodal communication including gestures, head nods, and typing on his phone. As he types, he does have a few errors, but these are felt to be typos secondary to using his nondominant hand, and he  shows appropriate awareness as he tries to self-correct. During oral motor exam, pt is noted to have minimal ROM bilaterally of his lips, tongue, and mandible. His tongue has minimal protrusion, with the tip reaching barely past his dentition, which are minimally parted. He can initiate phonation on command, but he does not have enough mobility to differentiate well between different phonemes. Pt will benefit from SLP f/u acutely and intensively upon discharge to maximize communication and continue to address cognitive-linguistic skills. ?   ?SLP Assessment ? SLP Recommendation/Assessment: Patient needs continued Speech Lanaguage Pathology Services ?SLP Visit Diagnosis: Dysarthria and anarthria (R47.1)  ?  ?Recommendations for follow up therapy are one component of a multi-disciplinary discharge planning process, led by the attending physician.  Recommendations may be updated based on patient status, additional functional criteria and insurance authorization. ?   ?Follow Up Recommendations ? Acute inpatient rehab (3hours/day)  ?  ?Assistance Recommended at Discharge ? Intermittent Supervision/Assistance  ?Functional Status Assessment Patient has had a recent decline in their functional status and demonstrates the ability to make significant improvements in function in a reasonable and predictable amount of time.  ?Frequency and Duration min 2x/week  ?2 weeks ?  ?   ?SLP Evaluation ?Cognition ? Overall Cognitive Status: Difficult to assess ?Arousal/Alertness: Awake/alert ?Orientation Level: Oriented to person ?Attention: Sustained ?Sustained Attention: Appears intact (simple tasks/questions) ?Problem Solving:  (demonstrates some basic-mildly complex, functional problem solving)  ?  ?   ?Comprehension ? Auditory Comprehension ?Overall Auditory Comprehension: Appears within functional limits for tasks assessed  ?  ?Expression Expression ?Primary Mode of Expression:  Nonverbal - written ?Verbal Expression ?Overall Verbal  Expression:  (see clinical impressions) ?Non-Verbal Means of Communication: Gestures;Writing ?Written Expression ?Dominant Hand: Right ?Written Expression:  (mild errors in typing felt to be related to use of nondominant hand)   ?Oral / Motor ? Oral Motor/Sensory Function ?Overall Oral Motor/Sensory Function: Severe impairment ?Facial ROM: Reduced right;Reduced left ?Facial Symmetry: Abnormal symmetry right ?Facial Strength: Reduced right;Reduced left ?Facial Sensation: Within Functional Limits ?Lingual ROM: Other (Comment) (minimal protrusion) ?Lingual Symmetry: Within Functional Limits ?Velum:  (UTA) ?Mandible: Impaired ?Motor Speech ?Overall Motor Speech: Impaired ?Phonation: Wet ?Articulation: Impaired ?Level of Impairment: Word ?Intelligibility: Intelligibility reduced ?Word: 0-24% accurate   ?        ? ?Mahala Menghini., M.A. CCC-SLP ?Acute Rehabilitation Services ?Pager [phone] ?Office [phone] ? ?11/08/2021, 5:15 PM ? ?

## 2021-11-08 NOTE — Progress Notes (Signed)
Inpatient Rehab Admissions Coordinator:  ? ?Pt continues to require bipap.  Will follow for improvement in respiratory status.   ? ?Estill Dooms, PT, DPT ?Admissions Coordinator ?[phone] ?11/08/21  ?12:04 PM ? ?

## 2021-11-09 ENCOUNTER — Inpatient Hospital Stay (HOSPITAL_COMMUNITY): Payer: Medicaid Other

## 2021-11-09 DIAGNOSIS — J9601 Acute respiratory failure with hypoxia: Secondary | ICD-10-CM

## 2021-11-09 DIAGNOSIS — J810 Acute pulmonary edema: Secondary | ICD-10-CM

## 2021-11-09 DIAGNOSIS — D751 Secondary polycythemia: Secondary | ICD-10-CM

## 2021-11-09 LAB — CBC WITH DIFFERENTIAL/PLATELET
Abs Immature Granulocytes: 0.04 10*3/uL (ref 0.00–0.07)
Basophils Absolute: 0 10*3/uL (ref 0.0–0.1)
Basophils Relative: 0 %
Eosinophils Absolute: 0.2 10*3/uL (ref 0.0–0.5)
Eosinophils Relative: 2 %
HCT: 53.7 % — ABNORMAL HIGH (ref 39.0–52.0)
Hemoglobin: 17.5 g/dL — ABNORMAL HIGH (ref 13.0–17.0)
Immature Granulocytes: 0 %
Lymphocytes Relative: 7 %
Lymphs Abs: 0.9 10*3/uL (ref 0.7–4.0)
MCH: 27.2 pg (ref 26.0–34.0)
MCHC: 32.6 g/dL (ref 30.0–36.0)
MCV: 83.4 fL (ref 80.0–100.0)
Monocytes Absolute: 0.6 10*3/uL (ref 0.1–1.0)
Monocytes Relative: 5 %
Neutro Abs: 11 10*3/uL — ABNORMAL HIGH (ref 1.7–7.7)
Neutrophils Relative %: 86 %
Platelets: 134 10*3/uL — ABNORMAL LOW (ref 150–400)
RBC: 6.44 MIL/uL — ABNORMAL HIGH (ref 4.22–5.81)
RDW: 14.8 % (ref 11.5–15.5)
WBC: 12.8 10*3/uL — ABNORMAL HIGH (ref 4.0–10.5)
nRBC: 0 % (ref 0.0–0.2)

## 2021-11-09 LAB — GLUCOSE, CAPILLARY
Glucose-Capillary: 121 mg/dL — ABNORMAL HIGH (ref 70–99)
Glucose-Capillary: 128 mg/dL — ABNORMAL HIGH (ref 70–99)
Glucose-Capillary: 130 mg/dL — ABNORMAL HIGH (ref 70–99)
Glucose-Capillary: 145 mg/dL — ABNORMAL HIGH (ref 70–99)
Glucose-Capillary: 153 mg/dL — ABNORMAL HIGH (ref 70–99)

## 2021-11-09 LAB — COMPREHENSIVE METABOLIC PANEL
ALT: 16 U/L (ref 0–44)
AST: 22 U/L (ref 15–41)
Albumin: 3.4 g/dL — ABNORMAL LOW (ref 3.5–5.0)
Alkaline Phosphatase: 48 U/L (ref 38–126)
Anion gap: 9 (ref 5–15)
BUN: 28 mg/dL — ABNORMAL HIGH (ref 6–20)
CO2: 25 mmol/L (ref 22–32)
Calcium: 8.7 mg/dL — ABNORMAL LOW (ref 8.9–10.3)
Chloride: 101 mmol/L (ref 98–111)
Creatinine, Ser: 1.94 mg/dL — ABNORMAL HIGH (ref 0.61–1.24)
GFR, Estimated: 45 mL/min — ABNORMAL LOW (ref >60)
Glucose, Bld: 124 mg/dL — ABNORMAL HIGH (ref 70–99)
Potassium: 3.8 mmol/L (ref 3.5–5.1)
Sodium: 135 mmol/L (ref 135–145)
Total Bilirubin: 1.5 mg/dL — ABNORMAL HIGH (ref 0.3–1.2)
Total Protein: 6.6 g/dL (ref 6.5–8.1)

## 2021-11-09 LAB — BRAIN NATRIURETIC PEPTIDE: B Natriuretic Peptide: 138.1 pg/mL — ABNORMAL HIGH (ref 0.0–100.0)

## 2021-11-09 LAB — MAGNESIUM: Magnesium: 1.8 mg/dL (ref 1.7–2.4)

## 2021-11-09 LAB — TRIGLYCERIDES: Triglycerides: 237 mg/dL — ABNORMAL HIGH (ref <150)

## 2021-11-09 IMAGING — DX DG CHEST 1V PORT
1 series · 1 of 1 positions shown · non-contrast
Comparison: Chest radiograph 1 day prior

CLINICAL DATA: Pulmonary edema

EXAM:
PORTABLE CHEST 1 VIEW

[chest ap]
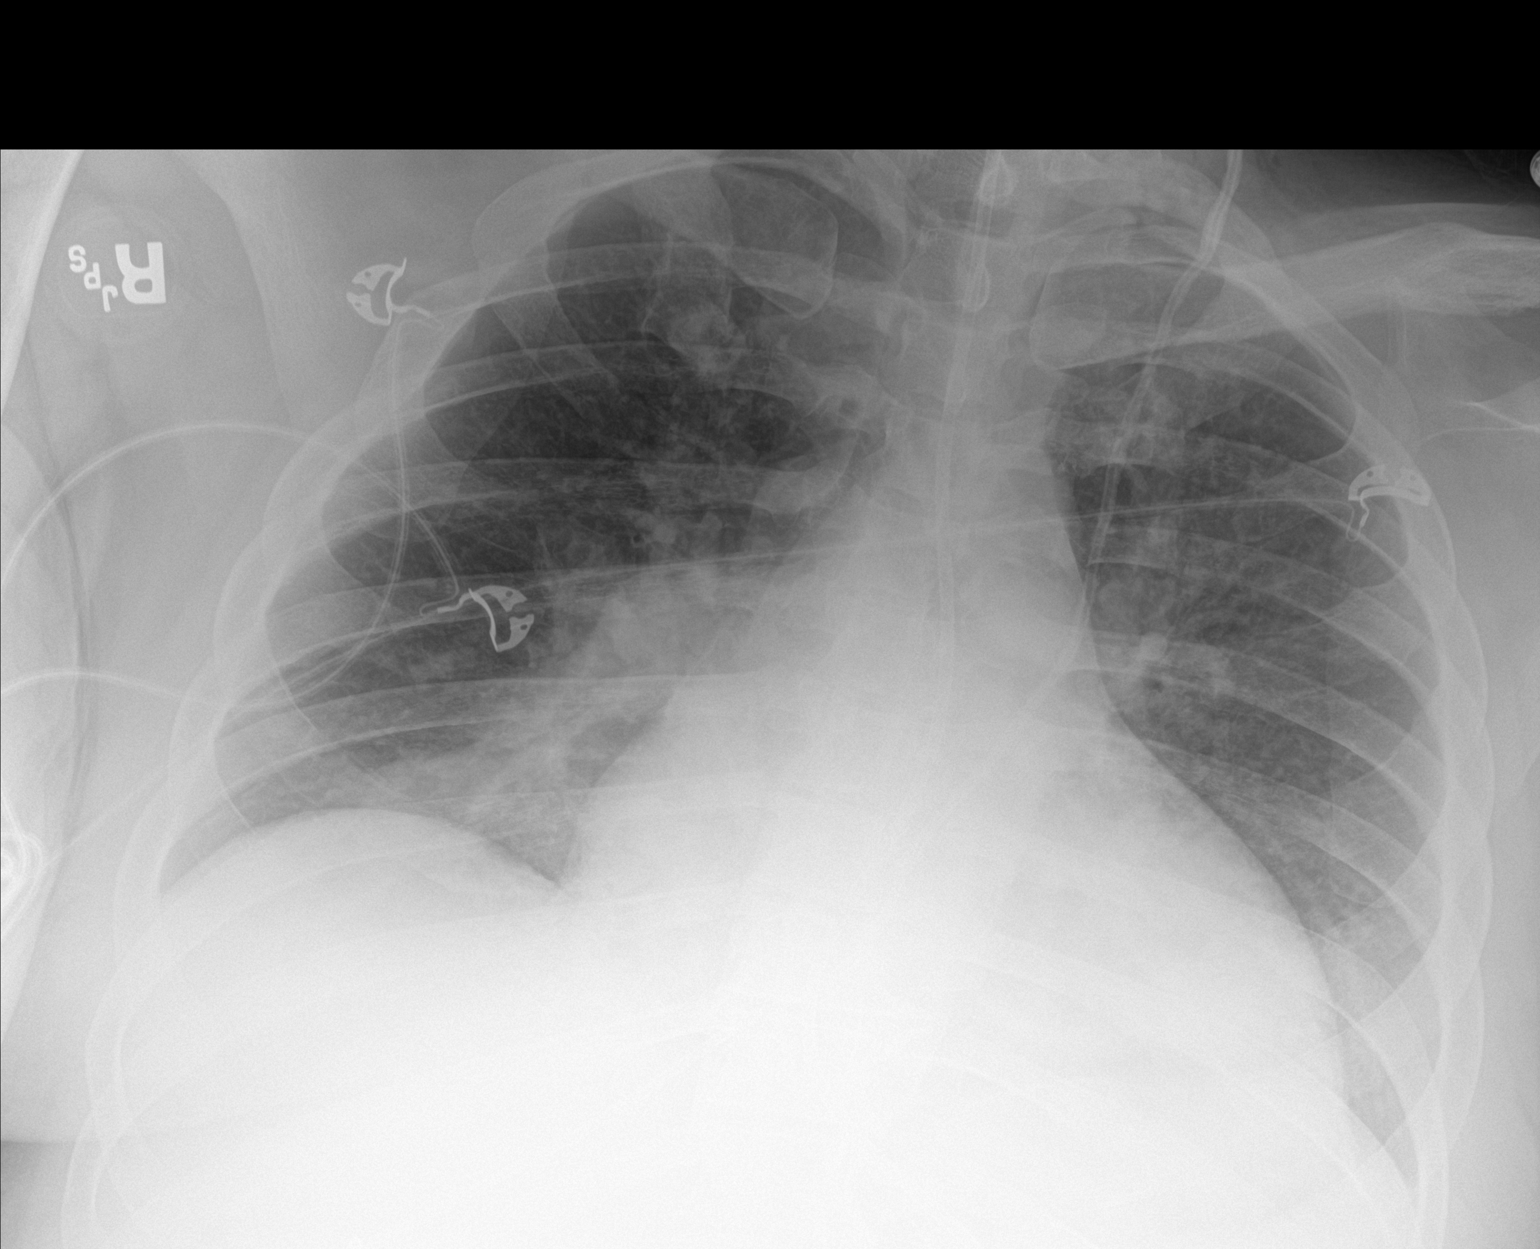

[1 of 1 positions shown; findings below may reference images not displayed]

FINDINGS: The enteric catheter tip is off the field of view. VP shunt tubing
is again seen coursing over the left hemithorax.

The heart is enlarged, unchanged. The mediastinal contours are
stable.

The pulmonary vasculature remains prominent with vascular congestion
but no definite overt pulmonary interstitial edema, not
significantly changed. There is retrocardiac opacity with
silhouetting of the left hemidiaphragm which is new since the prior
study. There is no other focal airspace opacity. There is no
significant pleural effusion. The previously seen right apical
pneumothorax is not seen on the current study.

The bones are stable.
IMPRESSION: 1. Cardiomegaly with vascular congestion but no definite overt
pulmonary edema, not significantly changed.
2. Retrocardiac opacity with silhouetting of the left hemidiaphragm
is new since the prior study and could reflect atelectasis or
developing infection. Two-view chest radiograph could be considered
for better evaluation if indicated.
3. The previously seen small right apical pneumothorax is not seen
on the current study.

## 2021-11-09 IMAGING — CT CT HEAD W/O CM
4 series · 16 of 47 positions shown, 18 images · non-contrast
Comparison: CT head [DATE]

CLINICAL DATA: Intracranial hemorrhage



[Series 3: head wo · axial · 0.46mm/px · z∈[-101,+19]mm · 7 of 34 slices shown, 9 images]
[im 5/34  brain]
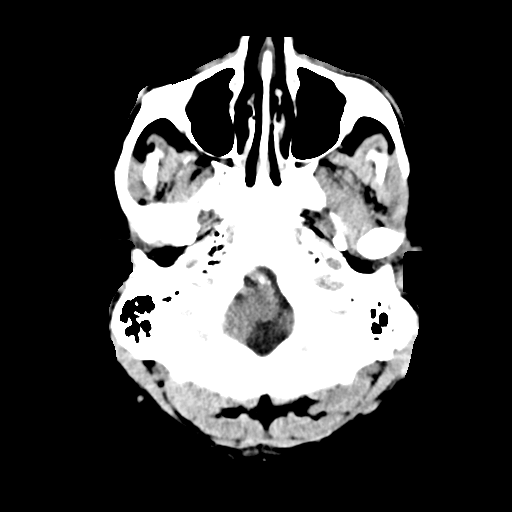
[im 5/34  bone]
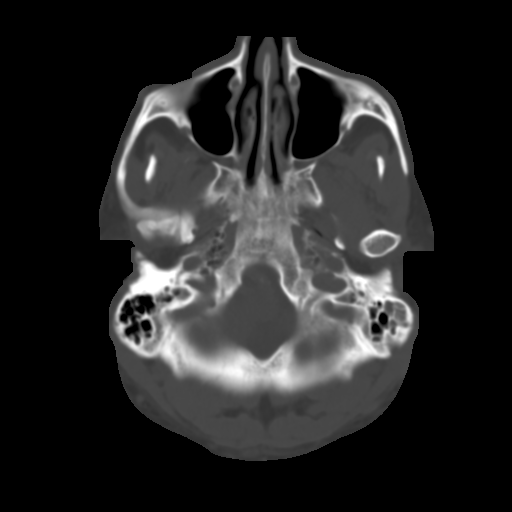
[im 9/34  brain]
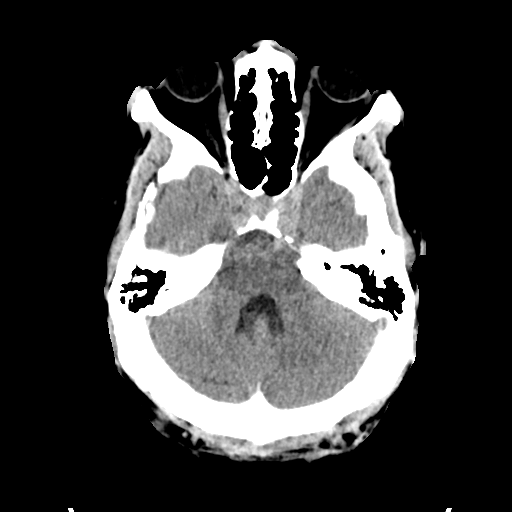
[im 13/34  brain]
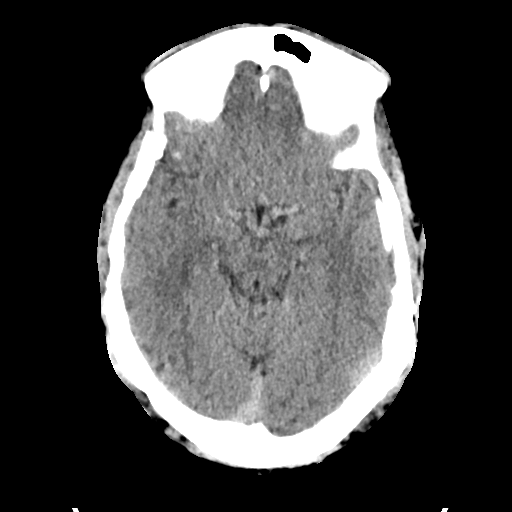
[im 17/34  brain]
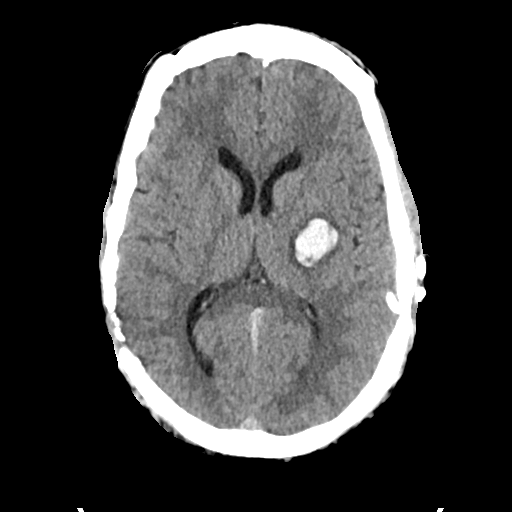
[im 21/34  brain]
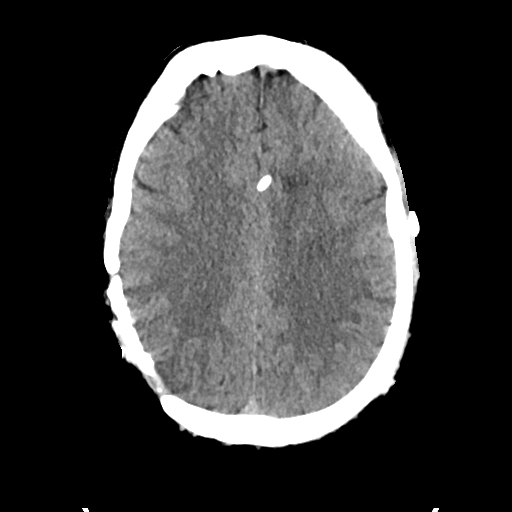
[im 21/34  bone]
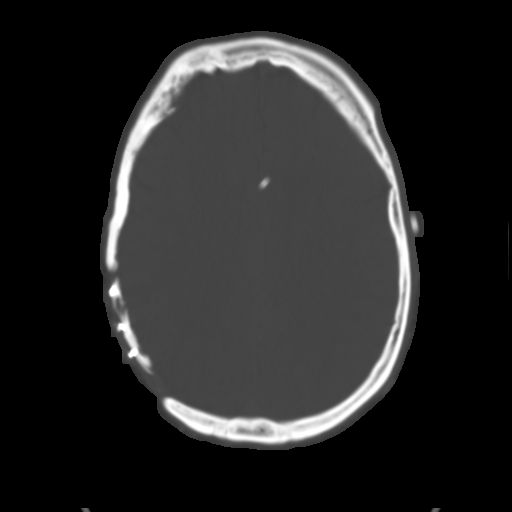
[im 25/34  brain]
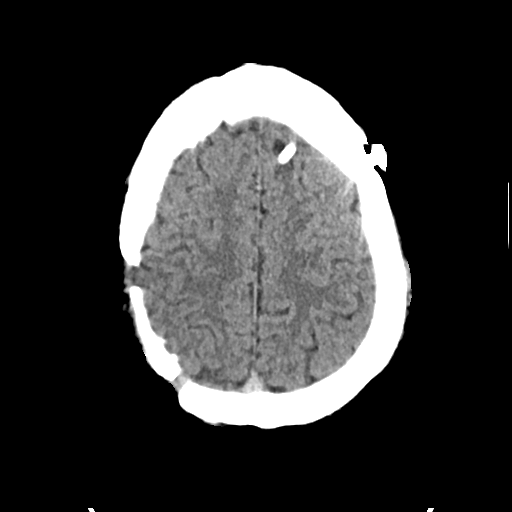
[im 29/34  brain]
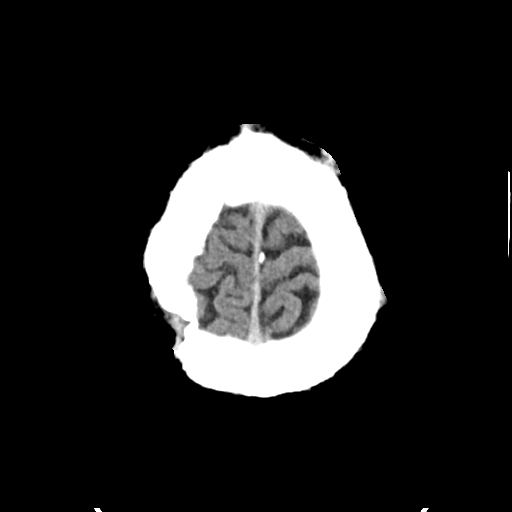

[Series 4: head bone · axial · 0.46mm/px · z∈[-105,-71]mm · 3 of 85 slices shown]
[im 9/85  bone]
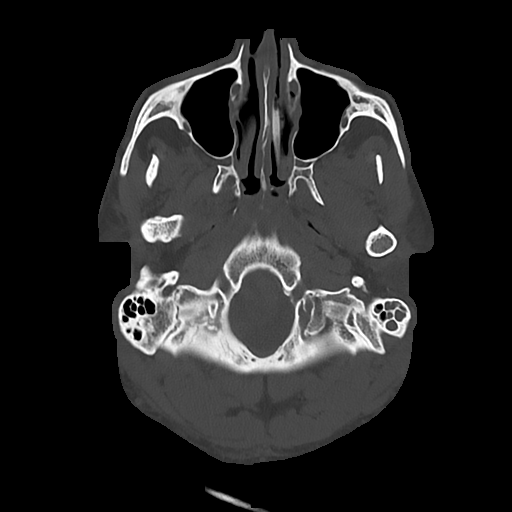
[im 17/85  bone]
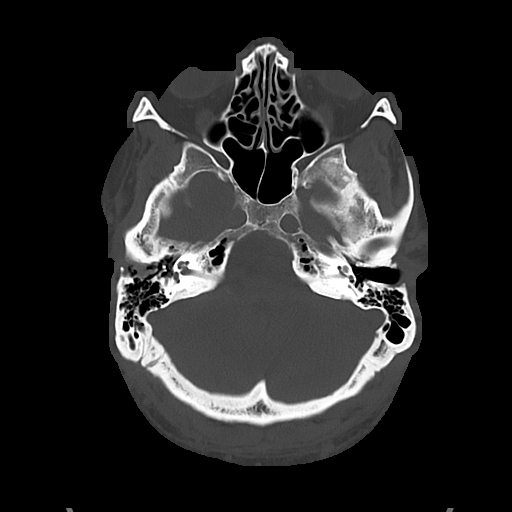
[im 26/85  bone]
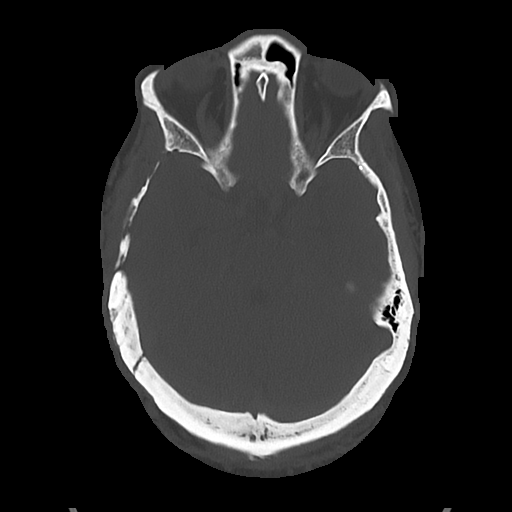

[Series 5: cor soft · coronal · 0.34mm/px · 3 of 80 slices shown]
[im 27/80  brain]
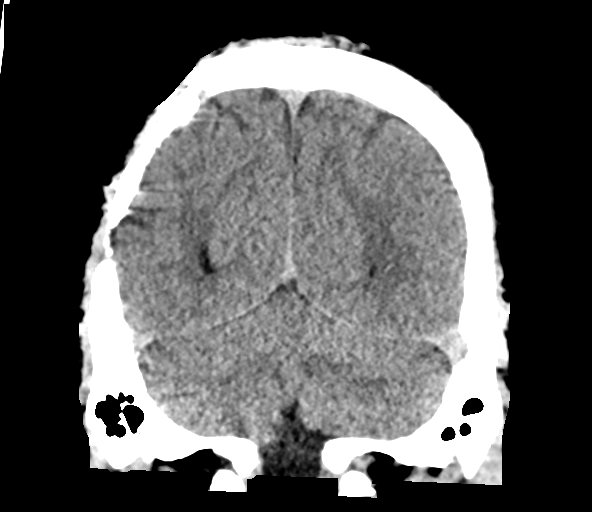
[im 36/80  brain]
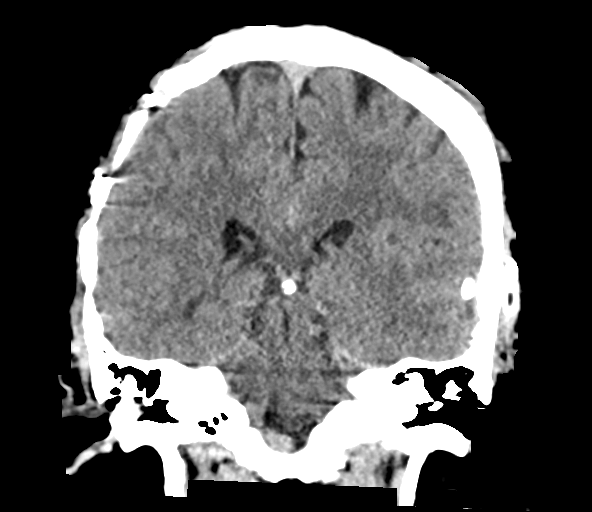
[im 44/80  brain]
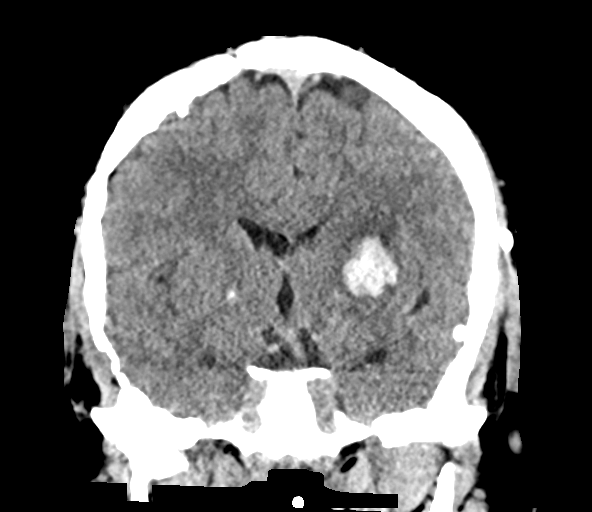

[Series 6: sag soft · sagittal · 0.35mm/px · 3 of 66 slices shown]
[im 22/66  brain]
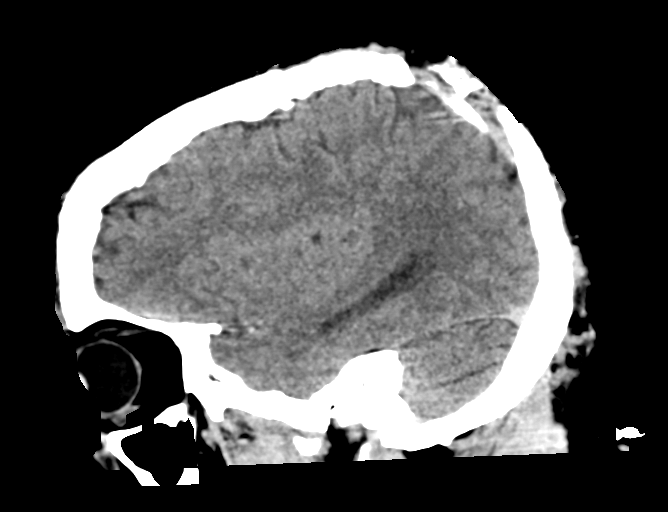
[im 33/66  brain]
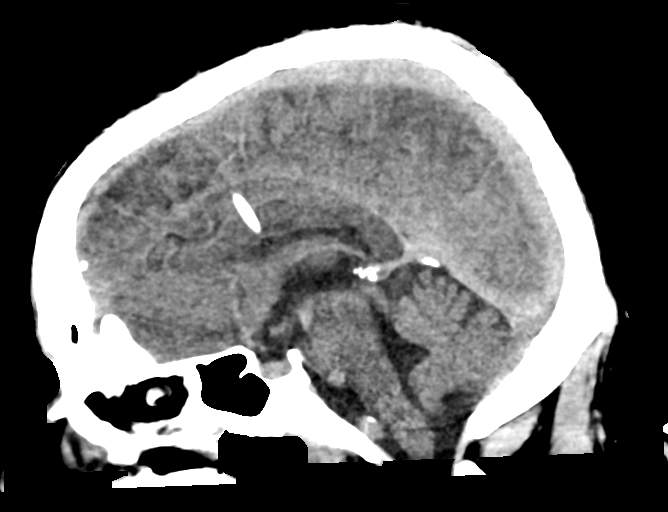
[im 44/66  brain]
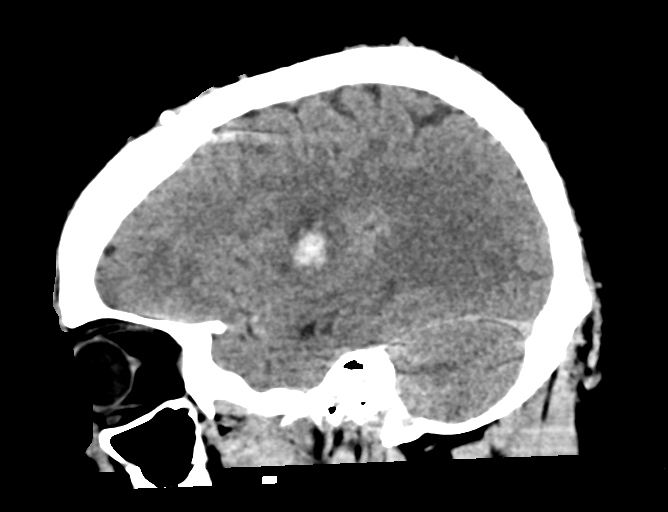

[16 of 47 positions shown; findings below may reference images not displayed]

FINDINGS: Brain: High-density acute hemorrhage left basal ganglia is unchanged
measuring approximately 2.1 x 1.6 x 2.3 cm. There is mild
surrounding edema. No new hemorrhage.

Ventricle size normal. Left frontal ventricular catheter crosses the
midline into the right frontal horn. There is surrounding
low-density encephalomalacia around the left frontal catheter.
Additional catheter lateral to the left temporal lobe is unchanged.
These 2 catheters extend into the scalp on the left but do not
appear to be connected with tubing in the left upper neck.

Small hypodensity right inferior cerebellum unchanged most
compatible with chronic infarct.

Vascular: Negative for hyperdense vessel

Skull: Right frontal and parietal extensive craniotomy changes. Left
frontal and left parietal burr hole for catheter placement.

Sinuses/Orbits: Mild mucosal edema left maxillary sinus. Remaining
sinuses clear. Negative orbit

Other: None
IMPRESSION: Acute hemorrhage left basal ganglia unchanged. Mild surrounding
edema. No significant midline shift. No new hemorrhage.

## 2021-11-09 MED ORDER — HYDROCHLOROTHIAZIDE 12.5 MG PO TABS
12.5000 mg | ORAL_TABLET | Freq: Every day | ORAL | Status: DC
  Administered 2021-11-09: 12.5 mg
  Filled 2021-11-09: qty 1

## 2021-11-09 MED ORDER — SALINE SPRAY 0.65 % NA SOLN
1.0000 | NASAL | Status: DC | PRN
  Administered 2021-11-09: 1 via NASAL
  Filled 2021-11-09: qty 44

## 2021-11-09 MED ORDER — PROSOURCE TF PO LIQD
45.0000 mL | Freq: Three times a day (TID) | ORAL | Status: DC
  Administered 2021-11-09 – 2021-11-13 (×13): 45 mL
  Filled 2021-11-09 (×13): qty 45

## 2021-11-09 MED ORDER — PANTOPRAZOLE 2 MG/ML SUSPENSION
40.0000 mg | Freq: Every day | ORAL | Status: DC
  Administered 2021-11-09 – 2021-11-13 (×5): 40 mg
  Filled 2021-11-09 (×5): qty 20

## 2021-11-09 MED ORDER — SENNOSIDES 8.8 MG/5ML PO SYRP
5.0000 mL | ORAL_SOLUTION | Freq: Two times a day (BID) | ORAL | Status: DC
  Administered 2021-11-09 – 2021-11-13 (×7): 5 mL
  Filled 2021-11-09 (×11): qty 5

## 2021-11-09 MED ORDER — HEPARIN SODIUM (PORCINE) 5000 UNIT/ML IJ SOLN
5000.0000 [IU] | Freq: Three times a day (TID) | INTRAMUSCULAR | Status: DC
Start: 2021-11-09 — End: 2021-11-13
  Administered 2021-11-09 – 2021-11-13 (×11): 5000 [IU] via SUBCUTANEOUS
  Filled 2021-11-09 (×12): qty 1

## 2021-11-09 MED ORDER — HYDRALAZINE HCL 25 MG PO TABS
25.0000 mg | ORAL_TABLET | Freq: Three times a day (TID) | ORAL | Status: DC
  Administered 2021-11-09 – 2021-11-10 (×4): 25 mg
  Filled 2021-11-09 (×4): qty 1

## 2021-11-09 MED ORDER — DOCUSATE SODIUM 50 MG/5ML PO LIQD
50.0000 mg | Freq: Two times a day (BID) | ORAL | Status: DC
  Administered 2021-11-09 – 2021-11-13 (×8): 50 mg
  Filled 2021-11-09 (×9): qty 10

## 2021-11-09 MED ORDER — CARVEDILOL 12.5 MG PO TABS
25.0000 mg | ORAL_TABLET | Freq: Two times a day (BID) | ORAL | Status: DC
  Administered 2021-11-09 – 2021-11-13 (×9): 25 mg
  Filled 2021-11-09 (×9): qty 2

## 2021-11-09 NOTE — Progress Notes (Signed)
Inpatient Rehab Admissions Coordinator:  ? ?Met with patient at bedside to introduce recommendation for CIR.  Explained 3 hrs/day and physician follow up with estimated length of stay about 2 weeks. Pt states he should have support from his parents at discharge.  Note still on cleviprex.  Will continue to follow for medical readiness and discuss disposition with family.  Will likely need 24/7 support.   ? ?Shann Medal, PT, DPT ?Admissions Coordinator ?[phone] ?11/09/21  ?1:25 PM ? ?

## 2021-11-09 NOTE — Progress Notes (Signed)
eLink Physician-Brief Progress Note ?Patient Name: Paul [name] ?DOB: April 18, 1986 ?MRN: [id number] ? ? ?Date of Service ? 11/09/2021  ?HPI/Events of Note ? Patient c/o stuffy nose/nasal congestion.   ?eICU Interventions ? Plan: ?Ocean Nasal Spray 1 spray to both nostrils PRN nasal congestion.   ? ? ? ?Intervention Category ?Major Interventions: Other: ? ?[name] ?11/09/2021, 7:58 PM ?

## 2021-11-09 NOTE — Plan of Care (Signed)
Discussed care plan with patient and father that is visiting.  ?

## 2021-11-09 NOTE — Progress Notes (Signed)
Speech Language Pathology Treatment: Dysphagia;Cognitive-Linquistic  ?Patient Details ?Name: Paul [name] ?MRN: [id number] ?DOB: [birth year] ?Today's Date: 11/09/2021 ?Time: [id number] ?SLP Time Calculation (min) (ACUTE ONLY): 25 min ? ?Assessment / Plan / Recommendation ?Clinical Impression ? Pt sounds less wet at baseline today and his supplemental O2 has been weaned down on . At times he looks like he might be able to open his mandible or protrude his tongue subtly more than he could on previous date, although he still has minimal movement of his lips and his performance overall fluctuates from one moment to the next (question motor planning component). He was able to open his jaw and lips the most during one instance of spontaneous yawning. SLP had him self-feed ice chips via spoon, and although he had been able to mimic small mastication movements just prior, he was not able to do so within the functional contexts of bolus preparation. He typed on his phone that he "can't chew the way I want to." Anterior spillage was noted out of the left side of his mouth (the direction in which he was turned at that time), and he ended up having to let the ice chip melt. Once it melted he was able to elicit a swallow response consistently, although it also resulted in delayed coughing. The cough today seemed to be more productive, as he was able to use the yankauer in his L hand and remove what was suspected to be a combination of melted ice and secretions. Pt should still remain NPO at this time but encouraged him to focus on coughing and using yankauer himself for ongoing secretion management. ? ?In terms of his communication, pt continues to primarily express himself using gestures and texting, and he expresses himself well with use of both. SLP also provided a picture board, and pt demonstrated his use of it with Mod I, scanning through the page well. Therapy today also focused on production at the phoneme level. This  included coordination of phonation and movement of his articulators as well as trying to move his articulators as much as possible. Pt would benefit from ongoing intensive SLP f/u. ?  ?HPI HPI: The pt is a 36 yo male presenting 3/18 with inability to talk. CT revealed ICH of L basal ganglia, BP 262/176. During admission, pt developed resp distress and pulmonary edema, placed on BiPAP. 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 ?  ?   ?SLP Plan ? Continue with current plan of care ? ?  ?  ?Recommendations for follow up therapy are one component of a multi-disciplinary discharge planning process, led by the attending physician.  Recommendations may be updated based on patient status, additional functional criteria and insurance authorization. ?  ? ?Recommendations  ?Diet recommendations: NPO ?Medication Administration: Via alternative means  ?   ?    ?   ? ? ? ? Oral Care Recommendations: Oral care QID ?Follow Up Recommendations: Acute inpatient rehab (3hours/day) ?Assistance recommended at discharge: Intermittent Supervision/Assistance ?SLP Visit Diagnosis: Dysarthria and anarthria (R47.1);Dysphagia, unspecified (R13.10) ?Plan: Continue with current plan of care ? ? ? ? ?  ?  ? ? ?Osie Bond., M.A. CCC-SLP ?Acute Rehabilitation Services ?Pager [phone] ?Office [phone] ? ? ?11/09/2021, 10:08 AM ?
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
## 2021-11-09 NOTE — Progress Notes (Signed)
PT Cancellation Note ? ?Patient Details ?Name: Paul [name] ?MRN: [id number] ?DOB: Apr 04, 1986 ? ? ?Cancelled Treatment:    Reason Eval/Treat Not Completed: Patient at procedure or test/unavailable - going to CT with RN presently, will check back. ? ?Marye Round, PT DPT ?Acute Rehabilitation Services ?Pager [phone]  ?Office [phone] ? ? ? ?Lorea Kupfer E Stroup ?11/09/2021, 11:52 AM ?

## 2021-11-09 NOTE — Progress Notes (Signed)
Physical Therapy Treatment ?Patient Details ?Name: Paul [name] ?MRN: [id number] ?DOB: 10-09-1985 ?Today's Date: 11/09/2021 ? ? ?History of Present Illness The pt is a 36 yo male presenting 3/18 with aphasia. CT revealed ICH of L basal ganglia, BP 262/176. During admission, pt developed resp distress and pulmonary edema, placed on Bipap. CXR 3/20 shows Interval development of a small right apical pneumothorax, less than 5% volume. 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 ? ?  ?PT Comments  ? ? Focus of session today was functional bed mobility, pre-gait, short distance ambulation, and STS transfers. The patient tolerated well.  Pt. Shows overall improvement with his R extremity control, standing balance, weight shifting, and stepping ability. R hemiparesis, inattention, and decreased coordination and balance are still limiting function. Pt. Would benefit from skilled PT to continue to address his functional mobility, gait, balance, and R extremity usage and attention. Plan and discharge setting remains unchanged. Pt to follow acutely as appropriate.  ?   ?Recommendations for follow up therapy are one component of a multi-disciplinary discharge planning process, led by the attending physician.  Recommendations may be updated based on patient status, additional functional criteria and insurance authorization. ? ?Follow Up Recommendations ? Acute inpatient rehab (3hours/day) ?  ?  ?Assistance Recommended at Discharge Frequent or constant Supervision/Assistance  ?Patient can return home with the following A lot of help with bathing/dressing/bathroom;Assistance with cooking/housework;Direct supervision/assist for medications management;Assist for transportation;Help with stairs or ramp for entrance;A lot of help with walking and/or transfers ?  ?Equipment Recommendations ? Other (comment) (defer to post acute setting)  ?  ?Recommendations for Other  Services Rehab consult ? ? ?  ?Precautions / Restrictions Precautions ?Precautions: Fall ?Precaution Comments: BP < 160, 5-8L [place] ?Restrictions ?Weight Bearing Restrictions: No  ?  ? ?Mobility ? Bed Mobility ?Overal bed mobility: Needs Assistance ?Bed Mobility: Supine to Sit, Sit to Supine ?Rolling: Supervision ?Sidelying to sit: Supervision ?Supine to sit: Min assist ?  ?  ?General bed mobility comments: Supervision for rolling and min A for supine to sit for truncal and LE management. Able to self balance in sitting while using R UE ?  ? ?Transfers ?Overall transfer level: Needs assistance ?Equipment used: Rolling walker (2 wheels) ?Transfers: Sit to/from Stand ?Sit to Stand: Min assist, +2 physical assistance (or mod +1) ?  ?Step pivot transfers: Min assist, +2 physical assistance ?  ?  ?  ?General transfer comment: Min +2 for power up, rise, and steady. Pt. able to maintain standing position and begin weight shifting B. Min A for weight shifting from ride side and VC for larger R steps for swing phase. Tacticle assist given at hips. Pt. perfromed STS x 4 and partial stand to scoot back in chair all min A +2 ?  ? ?Ambulation/Gait ?  ?Gait Distance (Feet): 6 Feet ?Assistive device: Rolling walker (2 wheels) ?Gait Pattern/deviations: Step-to pattern, Decreased step length - right, Decreased dorsiflexion - right, Decreased stance time - right, Decreased weight shift to right ?Gait velocity: decreased ?  ?Pre-gait activities: Standing B weight shifting and marching. Standing fwrd/bkwd stepping with R LE leading ?General Gait Details: Min A for weight shifting from hips, tacticle cues to hold R UE on walker, VC for R LE clearance ? ? ?Stairs ?  ?  ?  ?  ?  ? ? ?Wheelchair Mobility ?  ? ?Modified Rankin (Stroke Patients Only) ?Modified Rankin (Stroke Patients Only) ?  Pre-Morbid Rankin Score: No symptoms ?Modified Rankin: Moderately severe disability ? ? ?  ?Balance Overall balance assessment: Needs  assistance ?Sitting-balance support: No upper extremity supported, Feet supported ?Sitting balance-Leahy Scale: Good ?Sitting balance - Comments: Able to reach with R UE and maintain balance ?  ?Standing balance support: Bilateral upper extremity supported, During functional activity ?Standing balance-Leahy Scale: Poor ?Standing balance comment: dependent on RW for standing balance and steady ?  ?  ?  ?  ?  ?  ?  ?  ?  ?  ?  ?  ? ?  ?Cognition Arousal/Alertness: Awake/alert ?Behavior During Therapy: Flat affect ?Overall Cognitive Status: Difficult to assess ?  ?  ?  ?  ?  ?  ?  ?  ?  ?  ?  ?  ?  ?  ?  ?  ?General Comments: expressive difficulties, uses phone for text communication otherwise consistently nods yes/no to PT questions. Also able to use hand signals for numbers and directions. R inattention, frequent cues to adjust R hand placement ?  ?  ? ?  ?Exercises   ? ?  ?General Comments General comments (skin integrity, edema, etc.): 6L O2 sats drop to mid 80s during transfer, then patient placed on 8L [place] and sats rise back up to 90s. Multiple productive coughs requiring suction throughout the session. ?  ?  ? ?Pertinent Vitals/Pain Pain Assessment ?Pain Assessment: Faces ?Faces Pain Scale: Hurts a little bit ?Pain Location: throat from cortrack - more discomfort ?Pain Descriptors / Indicators: Discomfort, Nagging ?Pain Intervention(s): Limited activity within patient's tolerance, Monitored during session  ? ? ?Home Living   ?  ?  ?  ?  ?  ?  ?  ?  ?  ?   ?  ?Prior Function    ?  ?  ?   ? ?PT Goals (current goals can now be found in the care plan section) Acute Rehab PT Goals ?Patient Stated Goal: to get back to bed ?PT Goal Formulation: With patient ?Time For Goal Achievement: 11/21/21 ?Potential to Achieve Goals: Good ?Progress towards PT goals: Progressing toward goals ? ?  ?Frequency ? ? ? Min 4X/week ? ? ? ?  ?PT Plan Current plan remains appropriate  ? ? ?Co-evaluation   ?  ?  ?  ?  ? ?  ?AM-PAC PT "6  Clicks" Mobility   ?Outcome Measure ? Help needed turning from your back to your side while in a flat bed without using bedrails?: A Little ?Help needed moving from lying on your back to sitting on the side of a flat bed without using bedrails?: A Little ?Help needed moving to and from a bed to a chair (including a wheelchair)?: A Lot ?Help needed standing up from a chair using your arms (e.g., wheelchair or bedside chair)?: A Lot ?Help needed to walk in hospital room?: A Lot ?Help needed climbing 3-5 steps with a railing? : Total ?6 Click Score: 13 ? ?  ?End of Session Equipment Utilized During Treatment: Gait belt;Oxygen ?Activity Tolerance: Patient tolerated treatment well ?Patient left: with call bell/phone within reach;in chair;with chair alarm set ?Nurse Communication: Mobility status;Other (comment) (Oxygen saturation) ?PT Visit Diagnosis: Unsteadiness on feet (R26.81);Other abnormalities of gait and mobility (R26.89);Muscle weakness (generalized) (M62.81);Hemiplegia and hemiparesis ?Hemiplegia - Right/Left: Right ?Hemiplegia - dominant/non-dominant: Dominant ?Hemiplegia - caused by: Nontraumatic intracerebral hemorrhage ?  ? ? ?Time: 2683-4196 ?PT Time Calculation (min) (ACUTE ONLY): 37 min ? ?Charges:  $Therapeutic Activity: 8-22 mins ?$  Neuromuscular Re-education: 8-22 mins          ?          ? ?Paul [name], SPT ?Acute Rehab Services ? ? ? ?Paul [name] ?11/09/2021, 3:49 PM ? ?

## 2021-11-09 NOTE — Progress Notes (Addendum)
STROKE TEAM PROGRESS NOTE  ? ?INTERVAL HISTORY ?No family at bedside. Patient off of BiPAP now on nasal cannula, in no respiratory distress. Cortrak in place. Sialorrhea still present but much improved, will likely tolerate laying down for CTA today, ordered. Left-sided weakness 2/3 strength, about same as yesterday. PCCM switched metoprolol for coreg.  ? ?Vitals:  ? 11/09/21 0846 11/09/21 1200 11/09/21 1215 11/09/21 1230  ?BP: (!) 155/90 134/80 (!) 148/87 (!) 156/89  ?Pulse:      ?Resp:  (!) 24 (!) 32 19  ?Temp:      ?TempSrc:      ?SpO2:      ?Weight:      ?Height:      ? ?CBC:  ?Recent Labs  ?Lab 11/07/21 ?0050 11/07/21 ?1605 11/08/21 ?2671 11/09/21 ?2458  ?WBC 14.9*  --  16.5* 12.8*  ?NEUTROABS 13.2*  --   --  11.0*  ?HGB 20.0*   < > 17.8* 17.5*  ?HCT 60.3*   < > 53.2* 53.7*  ?MCV 81.8  --  81.8 83.4  ?PLT 261  --  189 134*  ? < > = values in this interval not displayed.  ? ?Basic Metabolic Panel:  ?Recent Labs  ?Lab 11/08/21 ?0998 11/09/21 ?3382  ?NA 139 135  ?K 3.6 3.8  ?CL 103 101  ?CO2 25 25  ?GLUCOSE 119* 124*  ?BUN 22* 28*  ?CREATININE 2.31* 1.94*  ?CALCIUM 8.8* 8.7*  ?MG  --  1.8  ? ?Lipid Panel:  ?Recent Labs  ?Lab 11/07/21 ?0055 11/09/21 ?5053  ?CHOL 229*  --   ?TRIG 209* 237*  ?HDL 51  --   ?CHOLHDL 4.5  --   ?VLDL 42*  --   ?LDLCALC 136*  --   ? ?HgbA1c:  ?Recent Labs  ?Lab 11/07/21 ?0055  ?HGBA1C 5.1  ? ?Urine Drug Screen:  ?Recent Labs  ?Lab 11/07/21 ?0054  ?LABOPIA NONE DETECTED  ?COCAINSCRNUR NONE DETECTED  ?LABBENZ NONE DETECTED  ?AMPHETMU NONE DETECTED  ?THCU NONE DETECTED  ?LABBARB NONE DETECTED  ?  ?Alcohol Level No results for input(s): ETH in the last 168 hours. ? ?IMAGING past 24 hours ?DG Abd 1 View ? ?Result Date: 11/08/2021 ?CLINICAL DATA:  Feeding tube placement EXAM: ABDOMEN - 1 VIEW COMPARISON:  None. FINDINGS: Feeding tube tip projects over the expected area the distal stomach. Nonobstructive bowel gas pattern. Partially visualized VP shunt catheter tubing with tip terminating in the  pelvis. No acute osseous abnormality. IMPRESSION: Feeding tube tip projects over the expected area the distal stomach. Electronically Signed   By: Allegra Lai M.D.   On: 11/08/2021 16:13  ? ?CT HEAD WO CONTRAST ( ) ? ?Result Date: 11/09/2021 ?CLINICAL DATA:  Intracranial hemorrhage EXAM: CT HEAD WITHOUT CONTRAST TECHNIQUE: Contiguous axial images were obtained from the base of the skull through the vertex without intravenous contrast. RADIATION DOSE REDUCTION: This exam was performed according to the departmental dose-optimization program which includes automated exposure control, adjustment of the mA and/or kV according to patient size and/or use of iterative reconstruction technique. COMPARISON:  CT head 11/07/2021 FINDINGS: Brain: High-density acute hemorrhage left basal ganglia is unchanged measuring approximately 2.1 x 1.6 x 2.3 cm. There is mild surrounding edema. No new hemorrhage. Ventricle size normal. Left frontal ventricular catheter crosses the midline into the right frontal horn. There is surrounding low-density encephalomalacia around the left frontal catheter. Additional catheter lateral to the left temporal lobe is unchanged. These 2 catheters extend into the scalp on the left but do  not appear to be connected with tubing in the left upper neck. Small hypodensity right inferior cerebellum unchanged most compatible with chronic infarct. Vascular: Negative for hyperdense vessel Skull: Right frontal and parietal extensive craniotomy changes. Left frontal and left parietal burr hole for catheter placement. Sinuses/Orbits: Mild mucosal edema left maxillary sinus. Remaining sinuses clear. Negative orbit Other: None IMPRESSION: Acute hemorrhage left basal ganglia unchanged. Mild surrounding edema. No significant midline shift. No new hemorrhage. Electronically Signed   By: Marlan Palauharles  Clark M.D.   On: 11/09/2021 12:28  ? ?DG CHEST PORT 1 VIEW ? ?Result Date: 11/09/2021 ?CLINICAL DATA:  Pulmonary edema  EXAM: PORTABLE CHEST 1 VIEW COMPARISON:  Chest radiograph 1 day prior FINDINGS: The enteric catheter tip is off the field of view. VP shunt tubing is again seen coursing over the left hemithorax. The heart is enlarged, unchanged. The mediastinal contours are stable. The pulmonary vasculature remains prominent with vascular congestion but no definite overt pulmonary interstitial edema, not significantly changed. There is retrocardiac opacity with silhouetting of the left hemidiaphragm which is new since the prior study. There is no other focal airspace opacity. There is no significant pleural effusion. The previously seen right apical pneumothorax is not seen on the current study. The bones are stable. IMPRESSION: 1. Cardiomegaly with vascular congestion but no definite overt pulmonary edema, not significantly changed. 2. Retrocardiac opacity with silhouetting of the left hemidiaphragm is new since the prior study and could reflect atelectasis or developing infection. Two-view chest radiograph could be considered for better evaluation if indicated. 3. The previously seen small right apical pneumothorax is not seen on the current study. Electronically Signed   By: Lesia HausenPeter  Noone M.D.   On: 11/09/2021 07:58  ? ?DG CHEST PORT 1 VIEW ? ?Result Date: 11/08/2021 ?CLINICAL DATA:  Patient states he feels fluid in chest when breathing today, stroke patient EXAM: PORTABLE CHEST 1 VIEW COMPARISON:  Chest x-ray 11/07/2021, chest x-ray 10/11/2021 FINDINGS: Lines and tubes overlie the patient. Ventriculoperitoneal shunt courses along the left chest wall. Enlarged cardiac silhouette with slightly rounded appearance. The heart and mediastinal contours are unchanged. Prominent hilar vasculature. No focal consolidation. Persistent slightly increased interstitial markings. No pleural effusion. No pneumothorax. No acute osseous abnormality. IMPRESSION: 1. Improving pulmonary edema. 2. Enlarged cardiac silhouette with underlying pericardial  effusion not excluded. Electronically Signed   By: Tish FredericksonMorgane  Naveau M.D.   On: 11/08/2021 17:21  ? ?VAS US RENAL ARTERY DUPLEX ? ?Result Date: 11/08/2021 ?ABDOMINAL VISCERAL Patient Name:  Paul [name]  Date of Exam:   11/08/2021 Medical Rec #: [id number]        Accession #:    [id number] Date of Birth: Apr 02, 1986        Patient Gender: M Patient Age:   89 years [facility] Procedure:      VAS US RENAL ARTERY DUPLEX Referring Phys: [id number] Scheryl MartenJINDONG Yassir Enis -------------------------------------------------------------------------------- Indications: Hypertension Limitations: Obesity. Comparison Study: 11-07-2021 Prior renal ultrasound showed increased                   echogenicity of bilateral kidneys suggestive of medical renal                   disease. Performing Technologist: Jean Rosenthalachel Hodge RDMS, RVT  Examination Guidelines: A complete evaluation includes B-mode imaging, spectral Doppler, color Doppler, and power Doppler as needed of all accessible portions of each vessel. Bilateral testing is considered an integral part of a complete examination. Limited examinations for  reoccurring indications may be performed as noted.  Duplex Findings: +----------------------+--------+--------+------+--------+ Mesenteric            PSV cm/sEDV cm/sPlaqueComments +----------------------+--------+--------+------+--------+ Aorta Mid               114                          +----------------------+--------+--------+------+--------+ Celiac Artery Origin    247                          +----------------------+--------+--------+------+--------+ Celiac Artery Proximal  243                          +----------------------+--------+--------+------+--------+ SMA Origin              266                          +----------------------+--------+--------+------+--------+ SMA Proximal            287                          +----------------------+--------+--------+------+--------+     +------------------+--------+--------+-------+ Right Renal ArteryPSV cm/sEDV cm/sComment +------------------+--------+--------+-------+ Origin              189      26           +------------------+--------+--------+-------+ Proximal

## 2021-11-09 NOTE — Plan of Care (Signed)
Care plan reviewed and education done on B/P medication ?

## 2021-11-09 NOTE — Progress Notes (Signed)
Pt placed on BiPAP per MD order w/ tube feeds paused. Pt resting comfortably. RT will cont to monitor.  ?

## 2021-11-10 ENCOUNTER — Inpatient Hospital Stay (HOSPITAL_COMMUNITY): Payer: Medicaid Other

## 2021-11-10 DIAGNOSIS — N189 Chronic kidney disease, unspecified: Secondary | ICD-10-CM

## 2021-11-10 DIAGNOSIS — N179 Acute kidney failure, unspecified: Secondary | ICD-10-CM

## 2021-11-10 DIAGNOSIS — K5900 Constipation, unspecified: Secondary | ICD-10-CM

## 2021-11-10 LAB — CBC
HCT: 54.7 % — ABNORMAL HIGH (ref 39.0–52.0)
Hemoglobin: 18.1 g/dL — ABNORMAL HIGH (ref 13.0–17.0)
MCH: 27.3 pg (ref 26.0–34.0)
MCHC: 33.1 g/dL (ref 30.0–36.0)
MCV: 82.4 fL (ref 80.0–100.0)
Platelets: 191 10*3/uL (ref 150–400)
RBC: 6.64 MIL/uL — ABNORMAL HIGH (ref 4.22–5.81)
RDW: 14.6 % (ref 11.5–15.5)
WBC: 10.1 10*3/uL (ref 4.0–10.5)
nRBC: 0 % (ref 0.0–0.2)

## 2021-11-10 LAB — COMPREHENSIVE METABOLIC PANEL
ALT: 20 U/L (ref 0–44)
AST: 21 U/L (ref 15–41)
Albumin: 3.3 g/dL — ABNORMAL LOW (ref 3.5–5.0)
Alkaline Phosphatase: 51 U/L (ref 38–126)
Anion gap: 10 (ref 5–15)
BUN: 29 mg/dL — ABNORMAL HIGH (ref 6–20)
CO2: 27 mmol/L (ref 22–32)
Calcium: 9 mg/dL (ref 8.9–10.3)
Chloride: 99 mmol/L (ref 98–111)
Creatinine, Ser: 1.55 mg/dL — ABNORMAL HIGH (ref 0.61–1.24)
GFR, Estimated: 59 mL/min — ABNORMAL LOW (ref >60)
Glucose, Bld: 140 mg/dL — ABNORMAL HIGH (ref 70–99)
Potassium: 3.6 mmol/L (ref 3.5–5.1)
Sodium: 136 mmol/L (ref 135–145)
Total Bilirubin: 1.6 mg/dL — ABNORMAL HIGH (ref 0.3–1.2)
Total Protein: 6.7 g/dL (ref 6.5–8.1)

## 2021-11-10 LAB — GLUCOSE, CAPILLARY
Glucose-Capillary: 153 mg/dL — ABNORMAL HIGH (ref 70–99)
Glucose-Capillary: 141 mg/dL — ABNORMAL HIGH (ref 70–99)
Glucose-Capillary: 138 mg/dL — ABNORMAL HIGH (ref 70–99)
Glucose-Capillary: 117 mg/dL — ABNORMAL HIGH (ref 70–99)
Glucose-Capillary: 113 mg/dL — ABNORMAL HIGH (ref 70–99)
Glucose-Capillary: 107 mg/dL — ABNORMAL HIGH (ref 70–99)

## 2021-11-10 IMAGING — DX DG CHEST 1V PORT
1 series · 1 of 1 positions shown · non-contrast
Comparison: [DATE].

CLINICAL DATA: Acute respiratory failure with hypoxia ,hx htn

EXAM:
PORTABLE CHEST 1 VIEW

[chest ap]
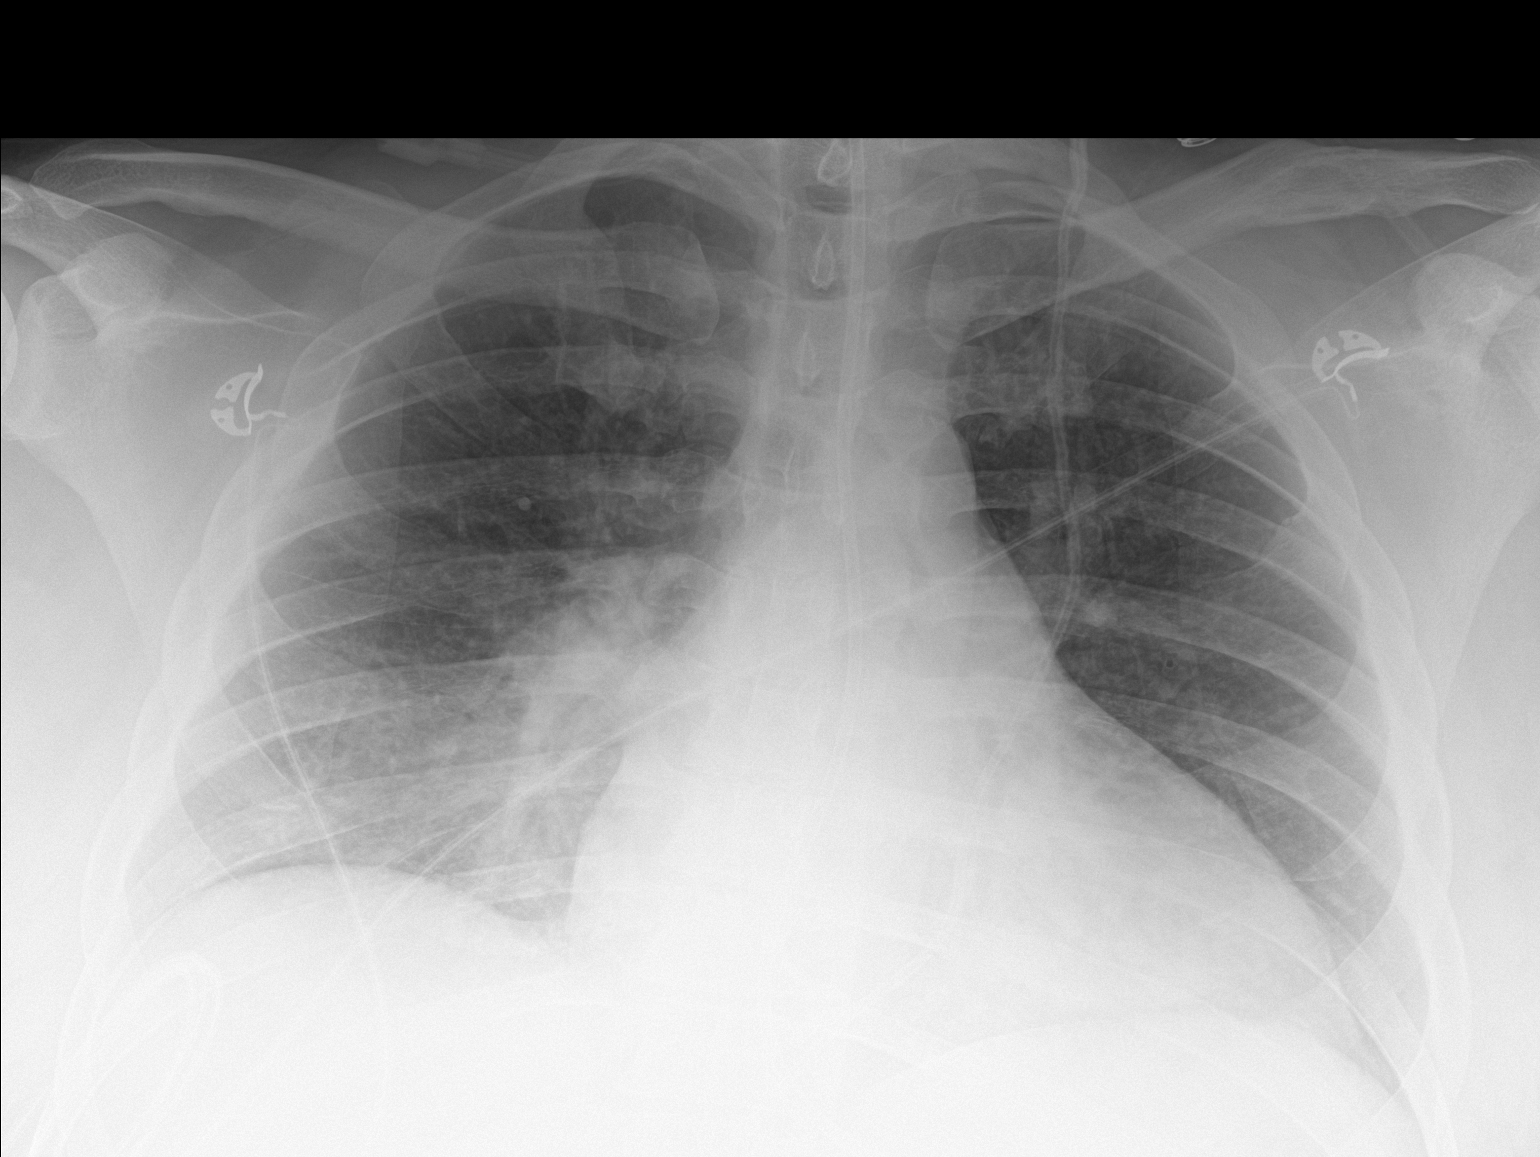

[1 of 1 positions shown; findings below may reference images not displayed]

FINDINGS: Similar retrocardiac opacity with increased ill-defined right
basilar opacities. No visible pneumothorax or pleural effusions on
this semi erect radiograph. Similar cardiomediastinal silhouette. VP
shunt tubing is again noted coursing along the left thorax. Enteric
tube courses below the diaphragm in outside the field of view.
IMPRESSION: Similar retrocardiac opacity with increased ill-defined right
basilar opacities, possibly pneumonia and/or atelectasis.

## 2021-11-10 MED ORDER — SPIRONOLACTONE 25 MG PO TABS
50.0000 mg | ORAL_TABLET | Freq: Every day | ORAL | Status: DC
  Administered 2021-11-10 – 2021-11-13 (×4): 50 mg
  Filled 2021-11-10 (×3): qty 2

## 2021-11-10 MED ORDER — POTASSIUM CHLORIDE 20 MEQ PO PACK
40.0000 meq | PACK | Freq: Once | ORAL | Status: AC
  Administered 2021-11-10: 40 meq
  Filled 2021-11-10: qty 2

## 2021-11-10 MED ORDER — SPIRONOLACTONE 25 MG PO TABS
25.0000 mg | ORAL_TABLET | Freq: Every day | ORAL | Status: DC
  Filled 2021-11-10: qty 1

## 2021-11-10 MED ORDER — CLONIDINE HCL 0.1 MG PO TABS
0.2000 mg | ORAL_TABLET | Freq: Three times a day (TID) | ORAL | Status: DC
  Administered 2021-11-10 – 2021-11-13 (×10): 0.2 mg
  Filled 2021-11-10 (×3): qty 2
  Filled 2021-11-10: qty 1
  Filled 2021-11-10 (×2): qty 2
  Filled 2021-11-10: qty 1
  Filled 2021-11-10 (×2): qty 2
  Filled 2021-11-10: qty 1

## 2021-11-10 MED ORDER — ROSUVASTATIN CALCIUM 20 MG PO TABS
20.0000 mg | ORAL_TABLET | Freq: Every day | ORAL | Status: DC
  Filled 2021-11-10: qty 1

## 2021-11-10 MED ORDER — HYDRALAZINE HCL 50 MG PO TABS
50.0000 mg | ORAL_TABLET | Freq: Three times a day (TID) | ORAL | Status: DC
  Administered 2021-11-10 – 2021-11-13 (×8): 50 mg
  Filled 2021-11-10 (×9): qty 1

## 2021-11-10 MED ORDER — CLONIDINE HCL 0.2 MG PO TABS: 0.2000 mg | ORAL_TABLET | Freq: Three times a day (TID) | ORAL | Status: DC

## 2021-11-10 MED ORDER — FLUTICASONE PROPIONATE 50 MCG/ACT NA SUSP
2.0000 | Freq: Two times a day (BID) | NASAL | Status: DC
  Administered 2021-11-10 – 2021-11-13 (×7): 2 via NASAL
  Filled 2021-11-10: qty 16

## 2021-11-10 MED ORDER — HYDROCHLOROTHIAZIDE 25 MG PO TABS
25.0000 mg | ORAL_TABLET | Freq: Every day | ORAL | Status: DC
  Administered 2021-11-10 – 2021-11-13 (×4): 25 mg
  Filled 2021-11-10 (×4): qty 1

## 2021-11-10 MED ORDER — LABETALOL HCL 5 MG/ML IV SOLN: 10.0000 mg | INTRAVENOUS | Status: DC | PRN

## 2021-11-10 MED ORDER — POLYETHYLENE GLYCOL 3350 17 G PO PACK
17.0000 g | PACK | Freq: Every day | ORAL | Status: DC
  Administered 2021-11-10 – 2021-11-13 (×3): 17 g
  Filled 2021-11-10 (×4): qty 1

## 2021-11-10 MED ORDER — ROSUVASTATIN CALCIUM 20 MG PO TABS
20.0000 mg | ORAL_TABLET | Freq: Every day | ORAL | Status: DC
  Administered 2021-11-11 – 2021-11-13 (×3): 20 mg
  Filled 2021-11-10 (×3): qty 1

## 2021-11-10 MED ORDER — HYDRALAZINE HCL 20 MG/ML IJ SOLN
5.0000 mg | Freq: Four times a day (QID) | INTRAMUSCULAR | Status: DC | PRN
  Administered 2021-11-12: 20 mg via INTRAVENOUS
  Filled 2021-11-10: qty 1

## 2021-11-10 MED ORDER — HYDRALAZINE HCL 20 MG/ML IJ SOLN: 10.0000 mg | Freq: Four times a day (QID) | INTRAMUSCULAR | Status: DC | PRN
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
## 2021-11-11 ENCOUNTER — Inpatient Hospital Stay (HOSPITAL_COMMUNITY): Payer: Medicaid Other

## 2021-11-11 DIAGNOSIS — R931 Abnormal findings on diagnostic imaging of heart and coronary circulation: Secondary | ICD-10-CM

## 2021-11-11 DIAGNOSIS — J9601 Acute respiratory failure with hypoxia: Secondary | ICD-10-CM

## 2021-11-11 DIAGNOSIS — Z9889 Other specified postprocedural states: Secondary | ICD-10-CM

## 2021-11-11 DIAGNOSIS — D751 Secondary polycythemia: Secondary | ICD-10-CM

## 2021-11-11 DIAGNOSIS — I69391 Dysphagia following cerebral infarction: Secondary | ICD-10-CM

## 2021-11-11 DIAGNOSIS — R1310 Dysphagia, unspecified: Secondary | ICD-10-CM

## 2021-11-11 HISTORY — DX: Other specified postprocedural states: Z98.890

## 2021-11-11 LAB — GLUCOSE, CAPILLARY
Glucose-Capillary: 100 mg/dL — ABNORMAL HIGH (ref 70–99)
Glucose-Capillary: 118 mg/dL — ABNORMAL HIGH (ref 70–99)
Glucose-Capillary: 144 mg/dL — ABNORMAL HIGH (ref 70–99)
Glucose-Capillary: 113 mg/dL — ABNORMAL HIGH (ref 70–99)
Glucose-Capillary: 120 mg/dL — ABNORMAL HIGH (ref 70–99)

## 2021-11-11 LAB — BASIC METABOLIC PANEL
Anion gap: 11 (ref 5–15)
BUN: 36 mg/dL — ABNORMAL HIGH (ref 6–20)
CO2: 24 mmol/L (ref 22–32)
Calcium: 8.8 mg/dL — ABNORMAL LOW (ref 8.9–10.3)
Chloride: 105 mmol/L (ref 98–111)
Creatinine, Ser: 1.78 mg/dL — ABNORMAL HIGH (ref 0.61–1.24)
GFR, Estimated: 50 mL/min — ABNORMAL LOW (ref >60)
Glucose, Bld: 99 mg/dL (ref 70–99)
Potassium: 4 mmol/L (ref 3.5–5.1)
Sodium: 140 mmol/L (ref 135–145)

## 2021-11-11 LAB — CBC
HCT: 51.3 % (ref 39.0–52.0)
Hemoglobin: 16.5 g/dL (ref 13.0–17.0)
MCH: 27 pg (ref 26.0–34.0)
MCHC: 32.2 g/dL (ref 30.0–36.0)
MCV: 83.8 fL (ref 80.0–100.0)
Platelets: 190 10*3/uL (ref 150–400)
RBC: 6.12 MIL/uL — ABNORMAL HIGH (ref 4.22–5.81)
RDW: 14.3 % (ref 11.5–15.5)
WBC: 7 10*3/uL (ref 4.0–10.5)
nRBC: 0 % (ref 0.0–0.2)

## 2021-11-11 LAB — MAGNESIUM: Magnesium: 2.1 mg/dL (ref 1.7–2.4)

## 2021-11-11 IMAGING — DX DG CHEST 1V PORT
1 series · 1 of 1 positions shown · non-contrast
Comparison: [DATE]

CLINICAL DATA: Acute respiratory failure with hypoxia.

EXAM:
PORTABLE CHEST 1 VIEW

[chest ap]
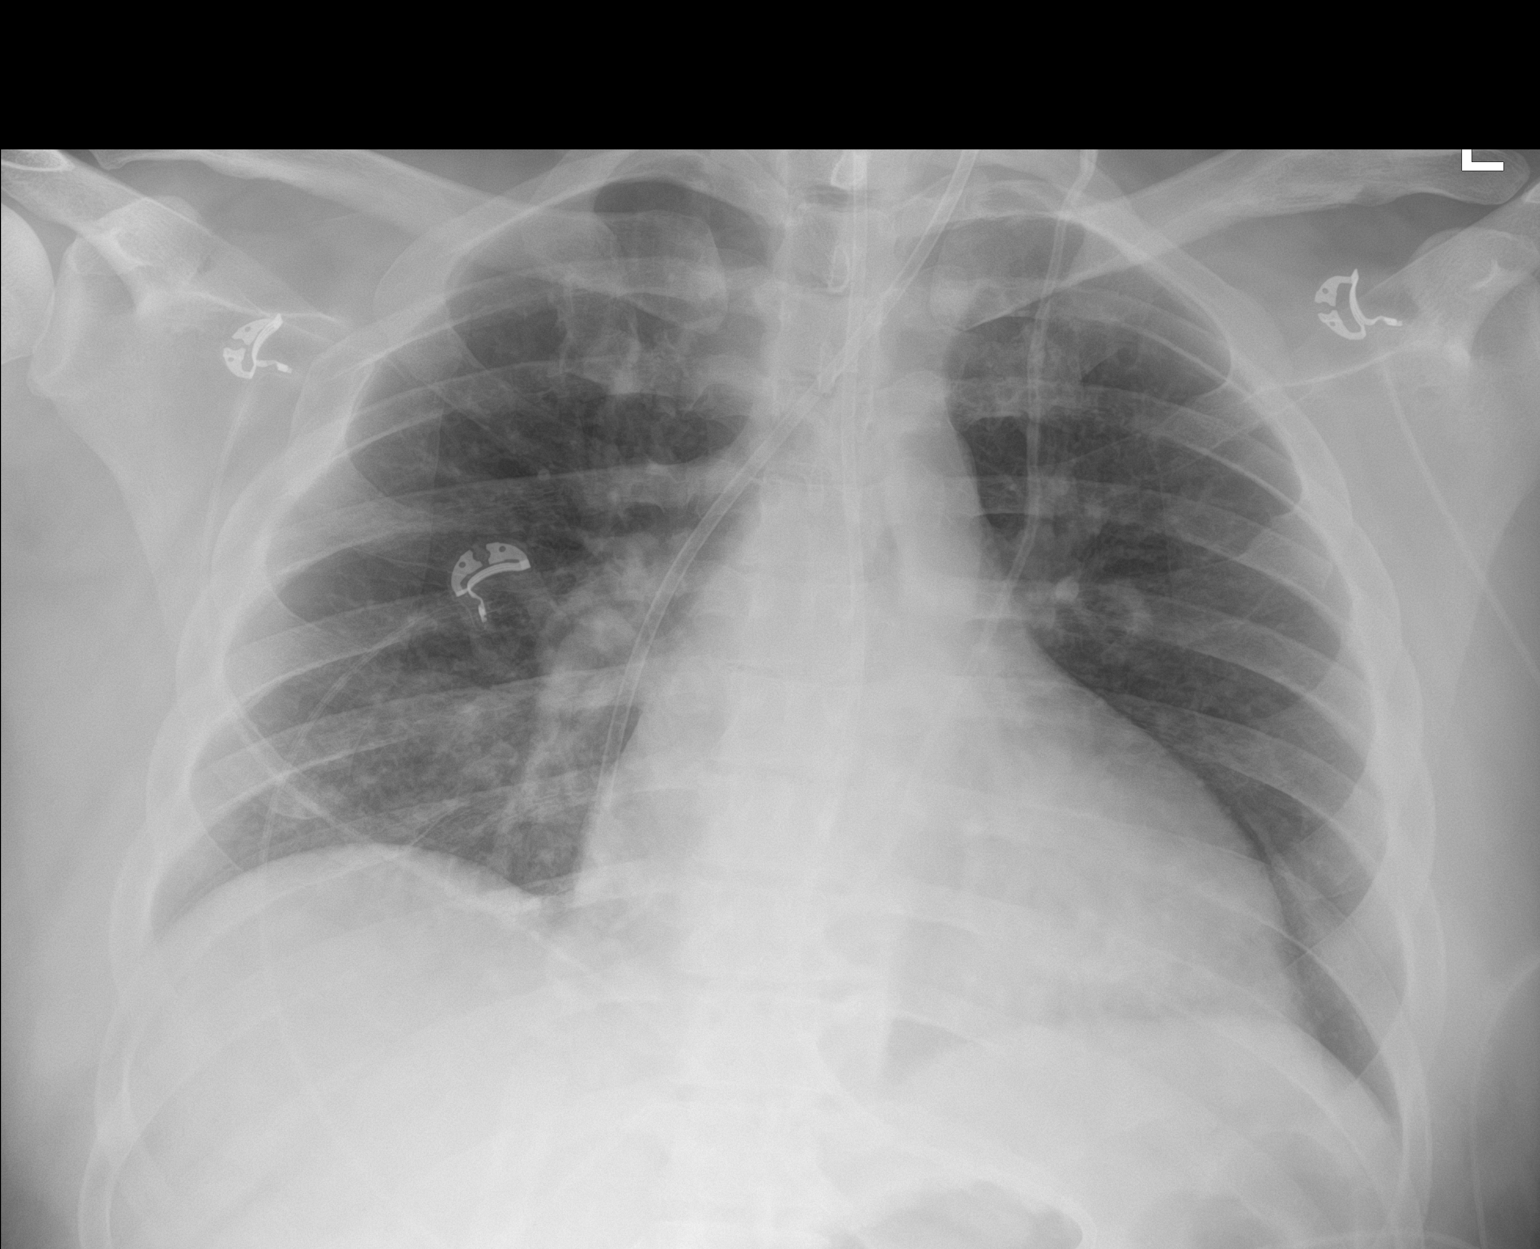

[1 of 1 positions shown; findings below may reference images not displayed]

FINDINGS: Unchanged enlarged cardiac silhouette and mediastinal contours.
Improved aeration of the lungs with persistent minimal bilateral
infrahilar opacities. No new focal airspace opacities. No pleural
effusion or pneumothorax. Pulmonary venous congestion without frank
evidence of edema. Ventriculoperitoneal catheter tubing courses
along the left midclavicular line. Enteric tube tip terminates below
the left hemidiaphragm. No acute osseous abnormalities.
IMPRESSION: 1. Improved aeration of the lungs suggests resolving atelectasis and
or edema.
2. Otherwise, similar findings of cardiomegaly, pulmonary venous
congestion and perihilar atelectasis.

## 2021-11-11 IMAGING — CT CT ANGIO HEAD-NECK (W OR W/O PERF)
1 of 11 series · 5 of 33 positions shown · IV contrast (APPLIED)
Comparison: Head CT from 2 days ago

CLINICAL DATA: Stroke follow-up

EXAM:
CT ANGIOGRAPHY HEAD AND NECK
TECHNIQUE: Multidetector CT imaging of the head and neck was performed using
the standard protocol during bolus administration of intravenous
contrast. Multiplanar CT image reconstructions and MIPs were
obtained to evaluate the vascular anatomy. Carotid stenosis
measurements (when applicable) are obtained utilizing NASCET
criteria, using the distal internal carotid diameter as the
denominator.

[Series 16: ax thins · axial · 0.39mm/px · z∈[-252,-12]mm · 5 of 362 slices shown]
[im 61/362  soft-tissue]
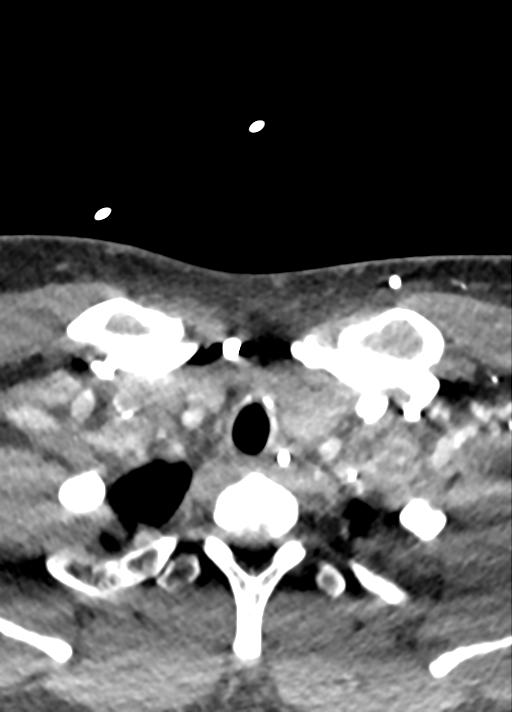
[im 121/362  bone]
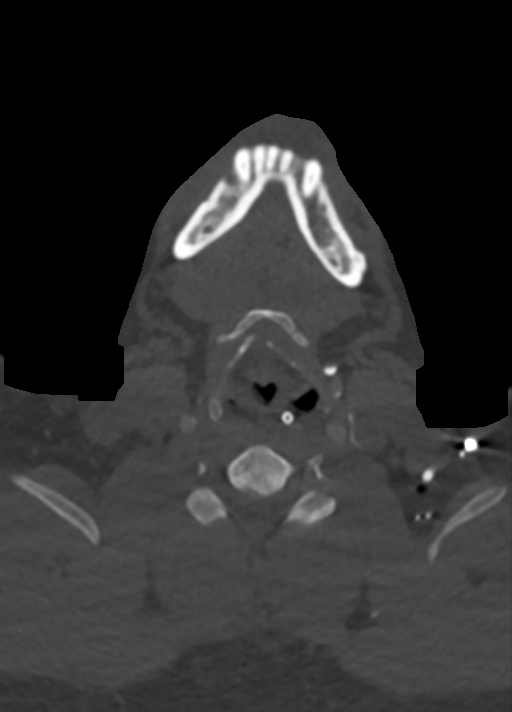
[im 181/362  soft-tissue]
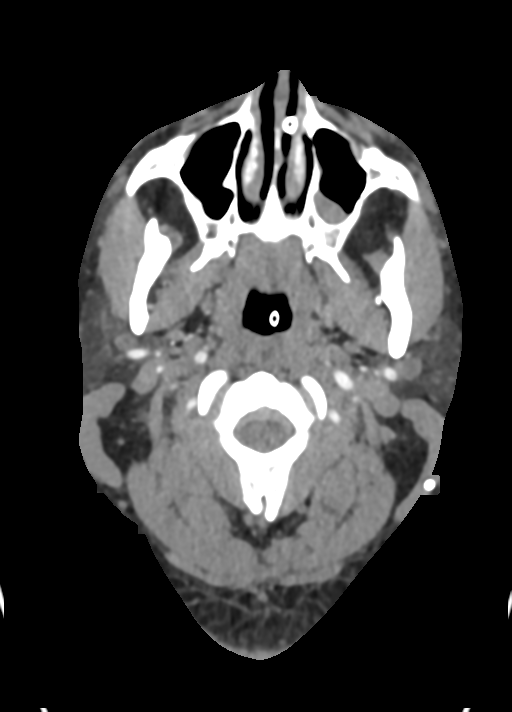
[im 241/362  bone]
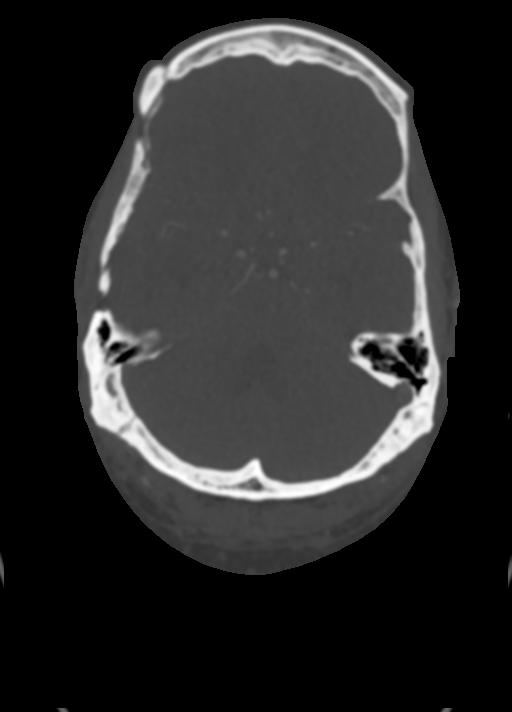
[im 301/362  soft-tissue]
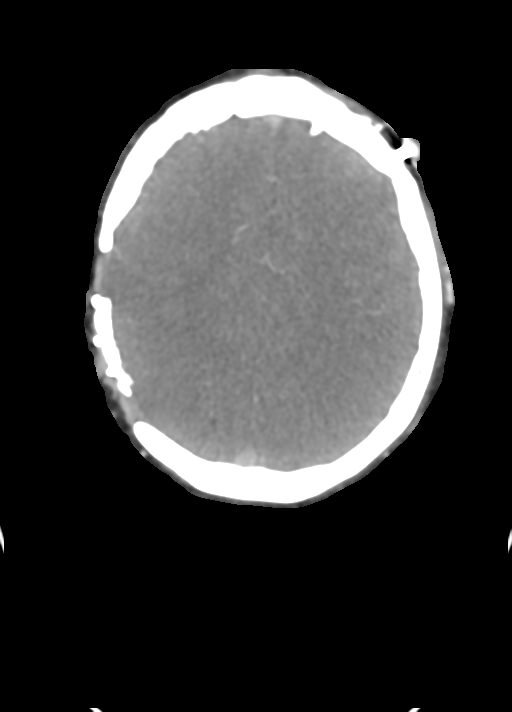

[5 of 33 positions shown; findings below may reference images not displayed]

RADIATION DOSE REDUCTION: This exam was performed according to the
departmental dose-optimization program which includes automated
exposure control, adjustment of the mA and/or kV according to
patient size and/or use of iterative reconstruction technique.

CONTRAST:  50mL OMNIPAQUE IOHEXOL 350 MG/ML SOLN
FINDINGS: CT HEAD FINDINGS

Brain: Unchanged hematoma centered at the left putamen, 2.3 cm in
diameter with thin rim of edema. Left frontal ventriculostomy with
stable low-density around the catheter at the left frontal lobe. The
tip is at the right frontal horn. No hydrocephalus. Left-sided shunt
catheter with tip in the left middle cranial fossa. Mild
encephalomalacia along the right sided craniotomy site. Small remote
right cerebellar infarct.

Vascular: See below

Skull: Right craniotomy and cranioplasty. Remote right mandibular
neck fracture.

Sinuses: Clear

Orbits: Negative

Review of the MIP images confirms the above findings

CTA NECK FINDINGS

Aortic arch: Limited coverage is negative.

Right carotid system: Distal ICA tortuosity. No flow limiting
stenosis, beading, or significant atherosclerosis.

Left carotid system: Distal ICA tortuosity. No flow limiting
stenosis, beading, or significant atherosclerosis.

Vertebral arteries: No proximal subclavian stenosis. The vertebral
arteries are diffusely patent and smoothly contoured

Skeleton: No acute or aggressive fine

Other neck: Ending no acute finding. Feeding tube traverses the
esophagus.

Upper chest: Anterior pneumomediastinum.

Review of the MIP images confirms the above findings

CTA HEAD FINDINGS

Anterior circulation: No aneurysm or vascular malformation seen
underlying the hematoma. Negative for spot sign. Symmetric
subjectively small medium size vessels diffusely. No branch
occlusion or beading.

Posterior circulation: Tortuous vertebral and basilar arteries.
Presumably atheromatous narrowing at the vertebrobasilar junction,
greater on the right where distal V4 segment is moderately narrowed.
Mild narrowing at the proximal basilar. Mild calcified plaque in the
left V4 segment. No major branch occlusion, generalized beading, or
aneurysm

Venous sinuses: Negative

Anatomic variants: Negative

Review of the MIP images confirms the above findings
IMPRESSION: 1. No vascular lesion seen underlying the TIGER.
2. Atheromatous narrowing at the vertebrobasilar junction and
proximal basilar.

## 2021-11-11 MED ORDER — IOHEXOL 350 MG/ML SOLN
50.0000 mL | Freq: Once | INTRAVENOUS | Status: AC | PRN
  Administered 2021-11-11: 50 mL via INTRAVENOUS

## 2021-11-11 NOTE — Plan of Care (Signed)
  Problem: Education: Goal: Knowledge of secondary prevention will improve (SELECT ALL) Outcome: Progressing   

## 2021-11-11 NOTE — Assessment & Plan Note (Addendum)
Goal SBP <160, with long term goal normotensive ?Continue amlodipine 10, coreg 25 mg BID, IV lasix 40 g BID, spironolactone 50, clonidipine 0.2 mg TID, hydralazing 50 mg TID, HCTZ 25 mg ?[place] without renal artery stenosis ?Continue to adjust BP meds in CIR ?Referral to cardiology outpatient, hypertension clinic ?

## 2021-11-11 NOTE — Assessment & Plan Note (Addendum)
Jak 2 testing pending ?Consider hematology follow outpatient ? ?

## 2021-11-11 NOTE — Assessment & Plan Note (Addendum)
Tube feeds with cortrak in place ?Follow SLP recs ?SLP following and recommending NPO ?

## 2021-11-11 NOTE — Plan of Care (Signed)
?  Problem: Self-Care: ?Goal: Ability to communicate needs accurately will improve ?Outcome: Progressing ?  ?Problem: Nutrition: ?Goal: Risk of aspiration will decrease ?Outcome: Progressing ?  ?Problem: Clinical Measurements: ?Goal: Respiratory complications will improve ?Outcome: Progressing ?  ?

## 2021-11-11 NOTE — Assessment & Plan Note (Addendum)
Baseline creatinine 1.3 ?Peaked at 2.3 ?Creatinine improving, follow on spironolactone/HCTZ ?Renal US without hydro ?No urinalysis done yet, he has foley in place -> had foley placed Paul [name] few days ago for retention - he'll need UA at some time in CIR, may need renal follow up if abnormal (hold off on UA now as likely abnormal with recent foley) ?Fluctuating over last few days, watch closely with diuresis ? ?

## 2021-11-11 NOTE — Progress Notes (Signed)
Received to room in w/c accompanied by nurse and dad.  Oriented to room and surroundings. Spoke with eLink  MD about his feedings because has a cortrak and cannot have BiPAP while receiving feedings and she said to just continue to keep feedings off until the morning once BiPAP is off.  He will resume his feedings at 8AM.   ?

## 2021-11-11 NOTE — Progress Notes (Signed)
Occupational Therapy Treatment ?Patient Details ?Name: Kathleene Hazel ?MRN: [id number] ?DOB: 1986/05/01 ?Today's Date: 11/11/2021 ? ? ?History of present illness The pt is a 36 yo male presenting 3/18 with aphasia. CT revealed ICH of L basal ganglia, BP 262/176. During admission, pt developed resp distress and pulmonary edema, placed on Bipap. CXR 3/20 shows Interval development of a small right apical pneumothorax, less than 5% volume, now resolved. 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 ?  ?OT comments ? Pt making progress with OT goals. This session, pt was in the process of returning to bed from recliner via stedy. OT session remained bed level as pt was fatigued from sitting up for a few hours in the chair, working with PT and transferring x2 via stedy. This session focused on exercises listed below. He was very agreeable to OT session and exercise. Pt demonstrating improved activity tolerance, strength and R side attention. Continuing to recommend AIR to maximize his independence prior to returning home. OT will continue to follow acutely.   ? ?Recommendations for follow up therapy are one component of a multi-disciplinary discharge planning process, led by the attending physician.  Recommendations may be updated based on patient status, additional functional criteria and insurance authorization. ?   ?Follow Up Recommendations ? Acute inpatient rehab (3hours/day)  ?  ?Assistance Recommended at Discharge Frequent or constant Supervision/Assistance  ?Patient can return home with the following ? A lot of help with walking and/or transfers;A lot of help with bathing/dressing/bathroom;Help with stairs or ramp for entrance;Assist for transportation;Assistance with feeding;Direct supervision/assist for financial management;Direct supervision/assist for medications management ?  ?Equipment Recommendations ? Other (comment) (TBD)  ?  ?Recommendations  for Other Services Rehab consult ? ?  ?Precautions / Restrictions Precautions ?Precautions: Fall ?Precaution Comments: BP < 160, on RA today (watch tachypnea) ?Restrictions ?Weight Bearing Restrictions: No  ? ? ?  ? ?Mobility Bed Mobility ?Overal bed mobility: Needs Assistance ?Bed Mobility: Sit to Supine ?  ?  ?  ?Sit to supine: Mod assist, HOB elevated ?  ?General bed mobility comments: Requiring assist bring BLE into bed and positioning himself ?  ? ?Transfers ?  ?  ?  ?  ?  ?  ?  ?  ?  ?General transfer comment: Deferred as pt just returned to bed from recliner ?  ?  ?Balance Overall balance assessment: Needs assistance ?Sitting-balance support: No upper extremity supported, Feet supported ?Sitting balance-Leahy Scale: Good ?Sitting balance - Comments: Sitting EOB ?  ?  ?  ?  ?  ?  ?  ?  ?  ?  ?  ?  ?  ?  ?  ?   ? ?ADL either performed or assessed with clinical judgement  ? ?ADL Overall ADL's : Needs assistance/impaired ?  ?  ?  ?  ?  ?  ?  ?  ?  ?  ?  ?  ?  ?  ?  ?  ?  ?  ?  ?General ADL Comments: Pt just cleaned up and returned to bed via steady with +2 RN, session focused on supine exercises ?  ? ?Extremity/Trunk Assessment   ?  ?  ?  ?  ?  ? ?Vision   ?  ?  ?Perception   ?  ?Praxis   ?  ? ?Cognition Arousal/Alertness: Awake/alert ?Behavior During Therapy: Flat affect ?Overall Cognitive Status: Difficult to assess ?  ?  ?  ?  ?  ?  ?  ?  ?  ?  ?  ?  ?  ?  ?  ?  ?  General Comments: Again today with expressive difficulties, uses phone for text communication otherwise consistently nods yes/no to questions. Pt with quick processing time throughout session, continued R inattention but attends when cued. ?  ?  ?   ?Exercises Exercises: Other exercises ?Other Exercises ?Other Exercises: AAROM RUE: hand flex/ext, pt able to assist after OT initiates - quickly tires after 2 reps, continues for 5 reps ?Other Exercises: AAROM RUE: wrist flex/ext - pt able to complete x10 reps, OT providing support so that pt does not  drop wrist quickly ?Other Exercises: AAROM RUE: elbow ext/flex - pt able to copmlete task with some force in extending, requires assist initiating, x10 reps ?Other Exercises: AAROM RUE: shoulder flexion, pt requires assist with initiation, once above 40 degrees, pt able to control flexoin up to 100 degrees, x 5 reps ?Other Exercises: pull forward to long sit - OT engages RUE, pt pulling well with LUE x5 reps ? ?  ?Shoulder Instructions   ? ? ?  ?General Comments VSS on RA  ? ? ?Pertinent Vitals/ Pain       Pain Assessment ?Pain Assessment: No/denies pain ? ?Home Living   ?  ?  ?  ?  ?  ?  ?  ?  ?  ?  ?  ?  ?  ?  ?  ?  ?  ?  ? ?  ?Prior Functioning/Environment    ?  ?  ?  ?   ? ?Frequency ? Min 2X/week  ? ? ? ? ?  ?Progress Toward Goals ? ?OT Goals(current goals can now be found in the care plan section) ? Progress towards OT goals: Progressing toward goals ? ?Acute Rehab OT Goals ?Patient Stated Goal: none stated ?OT Goal Formulation: With patient ?Time For Goal Achievement: 11/21/21 ?Potential to Achieve Goals: Good ?ADL Goals ?Pt Will Perform Eating: with set-up;sitting ?Pt Will Perform Grooming: with supervision;with set-up;sitting ?Pt Will Perform Upper Body Bathing: with set-up;with supervision;sitting ?Pt Will Perform Lower Body Bathing: with supervision;with set-up;sit to/from stand ?Pt Will Perform Upper Body Dressing: with supervision;with set-up;sitting ?Pt Will Perform Lower Body Dressing: with set-up;with supervision;sit to/from stand ?Pt Will Transfer to Toilet: with supervision;ambulating;bedside commode ?Pt Will Perform Toileting - Clothing Manipulation and hygiene: with supervision;sit to/from stand ?Additional ADL Goal #1: Pt will be Mod I in and OOB for basic ADLs  ?Plan Discharge plan remains appropriate;Frequency remains appropriate   ? ?Co-evaluation ? ? ?   ?  ?  ?  ?  ? ?  ?AM-PAC OT "6 Clicks" Daily Activity     ?Outcome Measure ? ? Help from another person eating meals?: Total ?Help from  another person taking care of personal grooming?: A Lot ?Help from another person toileting, which includes using toliet, bedpan, or urinal?: A Lot ?Help from another person bathing (including washing, rinsing, drying)?: A Lot ?Help from another person to put on and taking off regular upper body clothing?: A Lot ?Help from another person to put on and taking off regular lower body clothing?: A Lot ?6 Click Score: 11 ? ?  ?End of Session   ? ?OT Visit Diagnosis: Other abnormalities of gait and mobility (R26.89);Muscle weakness (generalized) (M62.81);Hemiplegia and hemiparesis ?Hemiplegia - Right/Left: Right ?Hemiplegia - dominant/non-dominant: Dominant ?Hemiplegia - caused by: Other Nontraumatic intracranial hemorrhage ?  ?Activity Tolerance Patient tolerated treatment well ?  ?Patient Left in bed;with call bell/phone within reach;with bed alarm set ?  ?Nurse Communication Mobility status ?  ? ?   ? ?  Time: 2595-6387 ?OT Time Calculation (min): 15 min ? ?Charges: OT General Charges ?$OT Visit: 1 Visit ?OT Treatments ?$Therapeutic Exercise: 8-22 mins ? ?Evangelyne Loja H., OTR/L ?Acute Rehabilitation ? ?Quintyn Dombek Elane Bing Plume ?11/11/2021, 4:36 PM ?
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
## 2021-11-11 NOTE — Assessment & Plan Note (Addendum)
Concerning for amyloid, will plan for cardiac MRI ?
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
## 2021-11-11 NOTE — Assessment & Plan Note (Addendum)
Left basal ganglia ICH in setting of uncontrolled hypertension ?CT head 3/18 with acute intraparenchymal hemorrhage in L basal ganglia, L frontal approach ventriculostomy catheter tip in R lateral ventricle ?3/19 head CT stable 5 ml L basal ganglia hemorrhage with mild regional edema and mass effect, no complicating features, extensive piro craniotomies and CSF shunting - current intracranial catheters disconnected from other shunt tubing in neck ?3/21 CT head with unchanged acute hemorrhage L basal ganglia, mild surrounding edema ?MRI canceled with aspiration risk -> MRI brain pending at this time (radiology awaiting information regarding shunt - follow in CIR) ?CTA head/neck without vascular lesion underlying ICH ?Echo with normal LV function, severe LVH, myocardium with speckled appearance and strain with possible "cherry on top" suggestive of amyloid -> cardiac MRI pending (follow in CIR) ?LDL 136, A1c 5.1 ?No antithrombotic 2/2 IPH ?Awaiting CIR ? ? ?

## 2021-11-11 NOTE — Progress Notes (Signed)
Physical Therapy Treatment ?Patient Details ?Name: Paul [name] ?MRN: [id number] ?DOB: [birth year] ?Today's Date: 11/11/2021 ? ? ?History of Present Illness The pt is a 36 yo male presenting 3/18 with aphasia. CT revealed ICH of L basal ganglia, BP 262/176. During admission, pt developed resp distress and pulmonary edema, placed on Bipap. CXR 3/20 shows Interval development of a small right apical pneumothorax, less than 5% volume, now resolved. 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 ? ?  ?PT Comments  ? ? Pt show steady progress toward goals, limited by R hemiparesis.  Emphasis on sit to stands, pregait activity including w/shifting--unweighting with toe off and progressing to gait stability/quality ing the RW. ?   ?Recommendations for follow up therapy are one component of a multi-disciplinary discharge planning process, led by the attending physician.  Recommendations may be updated based on patient status, additional functional criteria and insurance authorization. ? ?Follow Up Recommendations ? Acute inpatient rehab (3hours/day) ?  ?  ?Assistance Recommended at Discharge Frequent or constant Supervision/Assistance  ?Patient can return home with the following A lot of help with bathing/dressing/bathroom;Assistance with cooking/housework;Direct supervision/assist for medications management;Assist for transportation;Help with stairs or ramp for entrance;A lot of help with walking and/or transfers ?  ?Equipment Recommendations ? Other (comment) (TBD)  ?  ?Recommendations for Other Services Rehab consult ? ? ?  ?Precautions / Restrictions Precautions ?Precautions: Fall ?Precaution Comments: BP < 160, on RA today (watch tachypnea)  ?  ? ?Mobility ? Bed Mobility ?Overal bed mobility: Needs Assistance ?Bed Mobility: Supine to Sit ?  ?  ?Supine to sit: Min guard ?  ?  ?General bed mobility comments: up via R elbow, slower with mild struggle ?   ? ?Transfers ?Overall transfer level: Needs assistance ?Equipment used: Rolling walker (2 wheels) ?Transfers: Sit to/from Stand ?Sit to Stand: Min assist, +2 safety/equipment ?  ?  ?  ?  ?  ?General transfer comment: cues for hand placement, assist forward with minor boost ?  ? ?Ambulation/Gait ?Ambulation/Gait assistance: Min assist, +2 safety/equipment, +2 physical assistance, Mod assist ?Gait Distance (Feet): 25 Feet ?Assistive device: Rolling walker (2 wheels) ?Gait Pattern/deviations: Step-to pattern, Step-through pattern, Decreased step length - right, Decreased step length - left, Decreased stance time - left, Decreased stride length ?  ?Gait velocity interpretation: <1.31 ft/sec, indicative of household ambulator ?  ?General Gait Details: paretic on the right with consistent soft buckling, delayed activation with v/t cuing.  Generally unsteady with assist for swing through assist, w/shift assist, maneuvering the RW.  As expecedt degradation of gait pattern with fatigue. ? ? ?Stairs ?  ?  ?  ?  ?  ? ? ?Wheelchair Mobility ?  ? ?Modified Rankin (Stroke Patients Only) ?Modified Rankin (Stroke Patients Only) ?Pre-Morbid Rankin Score: No symptoms ?Modified Rankin: Moderately severe disability ? ? ?  ?Balance Overall balance assessment: Needs assistance ?Sitting-balance support: No upper extremity supported, Feet supported ?Sitting balance-Leahy Scale: Good ?  ?  ?Standing balance support: Bilateral upper extremity supported, During functional activity ?Standing balance-Leahy Scale: Poor ?Standing balance comment: started with pre-gait activity including w/shifting and stepping prior to gait.  Pt reliant on AD and external support. ?  ?  ?  ?  ?  ?  ?  ?  ?  ?  ?  ?  ? ?  ?Cognition Arousal/Alertness: Awake/alert ?Behavior During Therapy: Flat affect ?Overall Cognitive Status: Difficult to assess ?  ?  ?  ?  ?  ?  ?  ?  ?  ?  ?  ?  ?  ?  ?  ?  ?  General Comments: Again today with expressive difficulties, uses  phone for text communication otherwise consistently nods yes/no to PT questions. Pt with quick processing time throughout session, continued R inattention but attends when cued. ?  ?  ? ?  ?Exercises Other Exercises ?Other Exercises: bil hip/knee flex/ext AAROM to graded resistance x10 reps ?Other Exercises: bil bicep/tricep pressess from AA to graded resistance x 10 reps ? ?  ?General Comments General comments (skin integrity, edema, etc.): Initially BP in suping 134/87,  With activity, 154/86,  SpO2 97 during activity on RA,  elevated RR. ?  ?  ? ?Pertinent Vitals/Pain Pain Assessment ?Pain Assessment: No/denies pain ?Pain Intervention(s): Monitored during session  ? ? ?Home Living   ?  ?  ?  ?  ?  ?  ?  ?  ?  ?   ?  ?Prior Function    ?  ?  ?   ? ?PT Goals (current goals can now be found in the care plan section) Acute Rehab PT Goals ?Patient Stated Goal: pt related nonverbally that he like to work on gait today ?PT Goal Formulation: With patient ?Time For Goal Achievement: 11/21/21 ?Potential to Achieve Goals: Good ?Progress towards PT goals: Progressing toward goals ? ?  ?Frequency ? ? ? Min 4X/week ? ? ? ?  ?PT Plan Current plan remains appropriate  ? ? ?Co-evaluation   ?  ?  ?  ?  ? ?  ?AM-PAC PT "6 Clicks" Mobility   ?Outcome Measure ? Help needed turning from your back to your side while in a flat bed without using bedrails?: A Little ?Help needed moving from lying on your back to sitting on the side of a flat bed without using bedrails?: A Little ?Help needed moving to and from a bed to a chair (including a wheelchair)?: A Lot ?Help needed standing up from a chair using your arms (e.g., wheelchair or bedside chair)?: A Little ?Help needed to walk in hospital room?: A Lot ?Help needed climbing 3-5 steps with a railing? : Total ?6 Click Score: 14 ? ?  ?End of Session   ?Activity Tolerance: Patient tolerated treatment well ?Patient left: with call bell/phone within reach;in chair;with chair alarm set ?Nurse  Communication: Mobility status ?PT Visit Diagnosis: Unsteadiness on feet (R26.81);Other abnormalities of gait and mobility (R26.89);Muscle weakness (generalized) (M62.81);Hemiplegia and hemiparesis ?Hemiplegia - Right/Left: Right ?Hemiplegia - dominant/non-dominant: Dominant ?Hemiplegia - caused by: Nontraumatic intracerebral hemorrhage ?  ? ? ?Time: 7253-6644 ?PT Time Calculation (min) (ACUTE ONLY): 31 min ? ?Charges:  $Gait Training: 8-22 mins ?$Therapeutic Activity: 8-22 mins          ?          ? ?11/11/2021 ? ?Jacinto Halim., PT ?Acute Rehabilitation Services ?[phone]  (pager) ?[phone]  (office) ? ? ?Eliseo Gum Anthonymichael Munday ?11/11/2021, 2:01 PM ? ?

## 2021-11-11 NOTE — Progress Notes (Signed)
Inpatient Rehab Admissions Coordinator:  ? ?Met with patient at bedside.  Left voicemail for mom to discuss caregiver support available at discharge and cost of rehab.  Also Administrator, [facility] Counseling for medicaid/disability screen.  Will continue to follow.  ? ?Shann Medal, PT, DPT ?Admissions Coordinator ?[phone] ?11/11/21  ?12:52 PM ? ?

## 2021-11-11 NOTE — Assessment & Plan Note (Addendum)
Related to flash pulm edema with hypertensive emergency ?Probable OSA ?Hx tracheostomy ?Holding lasix now with bump in renal function with diuresis, follow volume status in CIR  ?CXR 3/23 with improved aeration of lungs (resolving atelectasis and/or edema) ?Strict I/O, daily weights ? ? ?
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
## 2021-11-12 ENCOUNTER — Encounter (HOSPITAL_COMMUNITY): Payer: Self-pay | Admitting: Neurology

## 2021-11-12 DIAGNOSIS — R931 Abnormal findings on diagnostic imaging of heart and coronary circulation: Secondary | ICD-10-CM

## 2021-11-12 LAB — CBC WITH DIFFERENTIAL/PLATELET
Abs Immature Granulocytes: 0.03 10*3/uL (ref 0.00–0.07)
Basophils Absolute: 0.1 10*3/uL (ref 0.0–0.1)
Basophils Relative: 1 %
Eosinophils Absolute: 0.2 10*3/uL (ref 0.0–0.5)
Eosinophils Relative: 2 %
HCT: 52.4 % — ABNORMAL HIGH (ref 39.0–52.0)
Hemoglobin: 16.5 g/dL (ref 13.0–17.0)
Immature Granulocytes: 0 %
Lymphocytes Relative: 11 %
Lymphs Abs: 0.9 10*3/uL (ref 0.7–4.0)
MCH: 26.7 pg (ref 26.0–34.0)
MCHC: 31.5 g/dL (ref 30.0–36.0)
MCV: 84.9 fL (ref 80.0–100.0)
Monocytes Absolute: 0.8 10*3/uL (ref 0.1–1.0)
Monocytes Relative: 10 %
Neutro Abs: 5.8 10*3/uL (ref 1.7–7.7)
Neutrophils Relative %: 76 %
Platelets: 209 10*3/uL (ref 150–400)
RBC: 6.17 MIL/uL — ABNORMAL HIGH (ref 4.22–5.81)
RDW: 14.3 % (ref 11.5–15.5)
WBC: 7.8 10*3/uL (ref 4.0–10.5)
nRBC: 0 % (ref 0.0–0.2)

## 2021-11-12 LAB — COMPREHENSIVE METABOLIC PANEL
ALT: 25 U/L (ref 0–44)
AST: 16 U/L (ref 15–41)
Albumin: 3.4 g/dL — ABNORMAL LOW (ref 3.5–5.0)
Alkaline Phosphatase: 49 U/L (ref 38–126)
Anion gap: 9 (ref 5–15)
BUN: 43 mg/dL — ABNORMAL HIGH (ref 6–20)
CO2: 32 mmol/L (ref 22–32)
Calcium: 9.2 mg/dL (ref 8.9–10.3)
Chloride: 100 mmol/L (ref 98–111)
Creatinine, Ser: 2.05 mg/dL — ABNORMAL HIGH (ref 0.61–1.24)
GFR, Estimated: 43 mL/min — ABNORMAL LOW (ref >60)
Glucose, Bld: 104 mg/dL — ABNORMAL HIGH (ref 70–99)
Potassium: 3.8 mmol/L (ref 3.5–5.1)
Sodium: 141 mmol/L (ref 135–145)
Total Bilirubin: 1.2 mg/dL (ref 0.3–1.2)
Total Protein: 6.9 g/dL (ref 6.5–8.1)

## 2021-11-12 LAB — GLUCOSE, CAPILLARY
Glucose-Capillary: 108 mg/dL — ABNORMAL HIGH (ref 70–99)
Glucose-Capillary: 108 mg/dL — ABNORMAL HIGH (ref 70–99)
Glucose-Capillary: 123 mg/dL — ABNORMAL HIGH (ref 70–99)
Glucose-Capillary: 128 mg/dL — ABNORMAL HIGH (ref 70–99)
Glucose-Capillary: 129 mg/dL — ABNORMAL HIGH (ref 70–99)
Glucose-Capillary: 130 mg/dL — ABNORMAL HIGH (ref 70–99)
Glucose-Capillary: 134 mg/dL — ABNORMAL HIGH (ref 70–99)
Glucose-Capillary: 135 mg/dL — ABNORMAL HIGH (ref 70–99)

## 2021-11-12 LAB — MAGNESIUM: Magnesium: 2.4 mg/dL (ref 1.7–2.4)

## 2021-11-12 LAB — TRIGLYCERIDES: Triglycerides: 112 mg/dL (ref <150)

## 2021-11-12 LAB — PHOSPHORUS: Phosphorus: 4.2 mg/dL (ref 2.5–4.6)
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
## 2021-11-12 NOTE — Progress Notes (Addendum)
Physical Therapy Treatment ?Patient Details ?Name: Paul [name] ?MRN: [id number] ?DOB: 10-10-1985 ?Today's Date: 11/12/2021 ? ? ?History of Present Illness The pt is a 36 yo male presenting 3/18 with aphasia. CT revealed ICH of L basal ganglia, BP 262/176. During admission, pt developed resp distress and pulmonary edema, placed on Bipap. CXR 3/20 shows Interval development of a small right apical pneumothorax, less than 5% volume, now resolved. 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 ? ?  ?PT Comments  ? ? Pt was eager to get up.  Emphasis on transition to EOB including scooting, multiple sit to stands, pre-gait in the RW, gait in the RW forward and stepping backward to the bed after sitting rest to regroup. ?   ?Recommendations for follow up therapy are one component of a multi-disciplinary discharge planning process, led by the attending physician.  Recommendations may be updated based on patient status, additional functional criteria and insurance authorization. ? ?Follow Up Recommendations ? Acute inpatient rehab (3hours/day) ?  ?  ?Assistance Recommended at Discharge Frequent or constant Supervision/Assistance  ?Patient can return home with the following A lot of help with bathing/dressing/bathroom;Assistance with cooking/housework;Direct supervision/assist for medications management;Assist for transportation;Help with stairs or ramp for entrance;A lot of help with walking and/or transfers ?  ?Equipment Recommendations ? Other (comment) (TBD)  ?  ?Recommendations for Other Services Rehab consult ? ? ?  ?Precautions / Restrictions Precautions ?Precautions: Fall  ?  ? ?Mobility ? Bed Mobility ?Overal bed mobility: Needs Assistance ?Bed Mobility: Supine to Sit, Sit to Supine ?  ?  ?Supine to sit: Min guard ?Sit to supine: Mod assist ?  ?General bed mobility comments: needed time to roll over to R side and come up with majority of L side assist and  no external assist.  Returning to supine is more difficult due to the weight of patients R leg and decreased truncal control. ?  ? ?Transfers ?Overall transfer level: Needs assistance ?Equipment used: Rolling walker (2 wheels) ?Transfers: Sit to/from Stand ?Sit to Stand: Mod assist (x3) ?  ?  ?  ?  ?  ?  ?  ? ?Ambulation/Gait ?Ambulation/Gait assistance: Min assist, Mod assist ?Gait Distance (Feet): 6 Feet (forward, 6 feet back after sitting rest break.) ?  ?Gait Pattern/deviations: Step-to pattern ?  ?Gait velocity interpretation: <1.31 ft/sec, indicative of household ambulator ?Pre-gait activities: w/shifting, unweighting, stepping, general balance in the RW x3 over >10 min ?General Gait Details: heavy paretic gait on the R LE, needing w/shift and stability to unweight R LE, but advances with cuing off toe to a flat contact.  Pt needs consistent guarding and facilitation to extend his R knee in a somewhat coordinated manner while stepping or pivoting the L foot.  2 person assist not available by sorely needed. ? ? ?Stairs ?  ?  ?  ?  ?  ? ? ?Wheelchair Mobility ?  ? ?Modified Rankin (Stroke Patients Only) ?Modified Rankin (Stroke Patients Only) ?Modified Rankin: Moderately severe disability ? ? ?  ?Balance Overall balance assessment: Needs assistance ?Sitting-balance support: No upper extremity supported, Feet supported ?Sitting balance-Leahy Scale: Good ?  ?  ?Standing balance support: Bilateral upper extremity supported, During functional activity ?Standing balance-Leahy Scale: Poor ?Standing balance comment: started with pre-gait activity including w/shifting and stepping prior to gait.  Pt reliant on AD and external support. ?  ?  ?  ?  ?  ?  ?  ?  ?  ?  ?  ?  ? ?  ?  Cognition Arousal/Alertness: Awake/alert ?Behavior During Therapy: Flat affect ?Overall Cognitive Status: Within Functional Limits for tasks assessed (follows commands well,) ?  ?  ?  ?  ?  ?  ?  ?  ?  ?  ?  ?  ?  ?  ?  ?  ?General Comments: Again  today with expressive difficulties, uses phone for text communication otherwise consistently nods yes/no to questions. Pt with quick processing time throughout session, continued R inattention but attends when cued. ?  ?  ? ?  ?Exercises   ? ?  ?General Comments   ?  ?  ? ?Pertinent Vitals/Pain Pain Assessment ?Pain Assessment: Faces ?Faces Pain Scale: No hurt ?Pain Intervention(s): Monitored during session  ? ? ?Home Living   ?  ?  ?  ?  ?  ?  ?  ?  ?  ?   ?  ?Prior Function    ?  ?  ?   ? ?PT Goals (current goals can now be found in the care plan section) Acute Rehab PT Goals ?PT Goal Formulation: With patient ?Time For Goal Achievement: 11/21/21 ?Potential to Achieve Goals: Good ?Progress towards PT goals: Progressing toward goals ? ?  ?Frequency ? ? ? Min 4X/week ? ? ? ?  ?PT Plan Current plan remains appropriate  ? ? ?Co-evaluation   ?  ?  ?  ?  ? ?  ?AM-PAC PT "6 Clicks" Mobility   ?Outcome Measure ? Help needed turning from your back to your side while in a flat bed without using bedrails?: A Little ?Help needed moving from lying on your back to sitting on the side of a flat bed without using bedrails?: A Little ?Help needed moving to and from a bed to a chair (including a wheelchair)?: A Lot ?Help needed standing up from a chair using your arms (e.g., wheelchair or bedside chair)?: A Lot ?Help needed to walk in hospital room?: A Lot ?Help needed climbing 3-5 steps with a railing? : Total ?6 Click Score: 13 ? ?  ?End of Session   ?Activity Tolerance: Patient tolerated treatment well ?Patient left: in bed;with call bell/phone within reach;with bed alarm set ?Nurse Communication: Mobility status ?PT Visit Diagnosis: Unsteadiness on feet (R26.81);Other abnormalities of gait and mobility (R26.89);Muscle weakness (generalized) (M62.81);Hemiplegia and hemiparesis ?Hemiplegia - Right/Left: Right ?Hemiplegia - dominant/non-dominant: Dominant ?Hemiplegia - caused by: Nontraumatic intracerebral hemorrhage ?  ? ? ?Time:  6222-9798 ?PT Time Calculation (min) (ACUTE ONLY): 29 min ? ?Charges:  $Gait Training: 8-22 mins ?$Neuromuscular Re-education: 8-22 mins          ?          ? ?11/12/2021 ? ?Jacinto Halim., PT ?Acute Rehabilitation Services ?[phone]  (pager) ?[phone]  (office) ? ? ?Eliseo Gum Tywanna Seifer ?11/12/2021, 5:50 PM ? ?
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
## 2021-11-12 NOTE — Progress Notes (Signed)
Inpatient Rehab Admissions Coordinator:  ? ?Was able to speak to pt's mother over the phone for CIR discussion.  Reviewed 3 hrs/day of therapy, estimated length of stay ~2 weeks, with goals of supervision.  She is in agreement and states family can provide expected level of care at discharge.  We reviewed cost of rehab and what that looks like with medicaid potential.  She verbalized understanding.  Discussed with Dr. Lowell Guitar and will plan to admit tomorrow, Saturday 3/25.  TOC and family aware.  Dr. Riley Kill will assess in AM for rehab to confirm admit and floor RN can call [phone] after 12 noon for report.  ? ?Estill Dooms, PT, DPT ?Admissions Coordinator ?[phone] ?11/12/21  ?2:18 PM ? ? ?

## 2021-11-12 NOTE — Progress Notes (Signed)
Speech Language Pathology Treatment: Dysphagia;Cognitive-Linquistic  ?Patient Details ?Name: Paul [name] ?MRN: [id number] ?DOB: [birth year] ?Today's Date: 11/12/2021 ?Time: 1037-1100 ?SLP Time Calculation (min) (ACUTE ONLY): 23 min ? ?Assessment / Plan / Recommendation ?Clinical Impression ? Pt upright in chair consenting to SLP intervention this date, but stating via text that it's "been a long morning". He opened oral cavity sufficiently for entering of toothbrush for completion of oral care, though he required intermittent cueing to sustain open mouth posture. Minimal tongue protrusion past lips achieved on command during completion of this task. He fully approximated lips for short productions of "m" phoneme and was responsive to cues for partition of lips to approximate "ah". He approximated consonant- vowel combination "mah" x4 given mod-max verbal/visual cues in preparation for production of his first name. No oral manipulation present with ice chips x2 or half teaspoon of thin liquid x4. He continues with head tilt backward, suspected as means for oral transit. Swallow initiation consistently noted via palpation with intermittent immediate/delayed coughing throughout. Family entered room mid session and educated regarding SLP POC in regards to swallowing and speech. Will continue f/u for PO readiness and completion of MBS. ?  ?HPI HPI: The pt is a 36 yo male presenting 3/18 with inability to talk. CT revealed ICH of L basal ganglia, BP 262/176. During admission, pt developed resp distress and pulmonary edema, placed on BiPAP. 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 ?  ?   ?SLP Plan ? Continue with current plan of care ? ?  ?  ?Recommendations for follow up therapy are one component of a multi-disciplinary discharge planning process, led by the attending physician.  Recommendations may be updated based on patient status, additional functional  criteria and insurance authorization. ?  ? ?Recommendations  ?Diet recommendations: NPO ?Medication Administration: Via alternative means  ?   ?    ?   ? ? ? ? Oral Care Recommendations: Oral care QID ?Follow Up Recommendations: Acute inpatient rehab (3hours/day) ?Assistance recommended at discharge: Intermittent Supervision/Assistance ?SLP Visit Diagnosis: Dysarthria and anarthria (R47.1);Dysphagia, unspecified (R13.10) ?Plan: Continue with current plan of care ? ? ? ? ?  ?  ? ?Paul Echevaria, MA, CCC-SLP ?Acute Rehabilitation Services ?Office Number: [phone] ? ?Paul [name] ? ?11/12/2021, 11:07 AM ?
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
## 2021-11-13 ENCOUNTER — Inpatient Hospital Stay (HOSPITAL_COMMUNITY)
Admission: RE | Admit: 2021-11-13 | Discharge: 2021-11-30 | DRG: 057 | Disposition: A | Discharge status: Home / Self Care | Payer: Medicaid Other | Source: Intra-hospital | Attending: Physical Medicine & Rehabilitation | Admitting: Physical Medicine & Rehabilitation

## 2021-11-13 ENCOUNTER — Other Ambulatory Visit: Payer: Self-pay

## 2021-11-13 DIAGNOSIS — D751 Secondary polycythemia: Secondary | ICD-10-CM | POA: Diagnosis present

## 2021-11-13 DIAGNOSIS — I69251 Hemiplegia and hemiparesis following other nontraumatic intracranial hemorrhage affecting right dominant side: Principal | ICD-10-CM

## 2021-11-13 DIAGNOSIS — Z8249 Family history of ischemic heart disease and other diseases of the circulatory system: Secondary | ICD-10-CM | POA: Diagnosis not present

## 2021-11-13 DIAGNOSIS — R001 Bradycardia, unspecified: Secondary | ICD-10-CM | POA: Diagnosis not present

## 2021-11-13 DIAGNOSIS — R339 Retention of urine, unspecified: Secondary | ICD-10-CM | POA: Diagnosis present

## 2021-11-13 DIAGNOSIS — I69220 Aphasia following other nontraumatic intracranial hemorrhage: Secondary | ICD-10-CM | POA: Diagnosis not present

## 2021-11-13 DIAGNOSIS — N179 Acute kidney failure, unspecified: Secondary | ICD-10-CM | POA: Diagnosis present

## 2021-11-13 DIAGNOSIS — R739 Hyperglycemia, unspecified: Secondary | ICD-10-CM | POA: Diagnosis not present

## 2021-11-13 DIAGNOSIS — Z79899 Other long term (current) drug therapy: Secondary | ICD-10-CM | POA: Diagnosis not present

## 2021-11-13 DIAGNOSIS — E669 Obesity, unspecified: Secondary | ICD-10-CM | POA: Diagnosis not present

## 2021-11-13 DIAGNOSIS — Z982 Presence of cerebrospinal fluid drainage device: Secondary | ICD-10-CM

## 2021-11-13 DIAGNOSIS — Z202 Contact with and (suspected) exposure to infections with a predominantly sexual mode of transmission: Secondary | ICD-10-CM | POA: Diagnosis not present

## 2021-11-13 DIAGNOSIS — M7741 Metatarsalgia, right foot: Secondary | ICD-10-CM | POA: Diagnosis not present

## 2021-11-13 DIAGNOSIS — Q231 Congenital insufficiency of aortic valve: Secondary | ICD-10-CM | POA: Diagnosis not present

## 2021-11-13 DIAGNOSIS — H518 Other specified disorders of binocular movement: Secondary | ICD-10-CM | POA: Diagnosis not present

## 2021-11-13 DIAGNOSIS — Z931 Gastrostomy status: Secondary | ICD-10-CM

## 2021-11-13 DIAGNOSIS — I69222 Dysarthria following other nontraumatic intracranial hemorrhage: Secondary | ICD-10-CM

## 2021-11-13 DIAGNOSIS — I69291 Dysphagia following other nontraumatic intracranial hemorrhage: Secondary | ICD-10-CM

## 2021-11-13 DIAGNOSIS — R1312 Dysphagia, oropharyngeal phase: Secondary | ICD-10-CM | POA: Diagnosis not present

## 2021-11-13 DIAGNOSIS — I69219 Unspecified symptoms and signs involving cognitive functions following other nontraumatic intracranial hemorrhage: Secondary | ICD-10-CM

## 2021-11-13 DIAGNOSIS — R1310 Dysphagia, unspecified: Secondary | ICD-10-CM | POA: Diagnosis not present

## 2021-11-13 DIAGNOSIS — H4911 Fourth [trochlear] nerve palsy, right eye: Secondary | ICD-10-CM | POA: Diagnosis not present

## 2021-11-13 DIAGNOSIS — Z87820 Personal history of traumatic brain injury: Secondary | ICD-10-CM | POA: Diagnosis not present

## 2021-11-13 DIAGNOSIS — I69391 Dysphagia following cerebral infarction: Secondary | ICD-10-CM

## 2021-11-13 DIAGNOSIS — Z597 Insufficient social insurance and welfare support: Secondary | ICD-10-CM

## 2021-11-13 DIAGNOSIS — Z6835 Body mass index (BMI) 35.0-35.9, adult: Secondary | ICD-10-CM | POA: Diagnosis not present

## 2021-11-13 DIAGNOSIS — I10 Essential (primary) hypertension: Secondary | ICD-10-CM | POA: Diagnosis not present

## 2021-11-13 DIAGNOSIS — R338 Other retention of urine: Secondary | ICD-10-CM

## 2021-11-13 DIAGNOSIS — G9389 Other specified disorders of brain: Secondary | ICD-10-CM | POA: Diagnosis not present

## 2021-11-13 DIAGNOSIS — I639 Cerebral infarction, unspecified: Secondary | ICD-10-CM | POA: Diagnosis not present

## 2021-11-13 DIAGNOSIS — I619 Nontraumatic intracerebral hemorrhage, unspecified: Secondary | ICD-10-CM | POA: Diagnosis not present

## 2021-11-13 DIAGNOSIS — I69221 Dysphasia following other nontraumatic intracranial hemorrhage: Secondary | ICD-10-CM

## 2021-11-13 DIAGNOSIS — E7800 Pure hypercholesterolemia, unspecified: Secondary | ICD-10-CM | POA: Diagnosis present

## 2021-11-13 LAB — CBC
HCT: 52.9 % — ABNORMAL HIGH (ref 39.0–52.0)
Hemoglobin: 17 g/dL (ref 13.0–17.0)
MCH: 27.1 pg (ref 26.0–34.0)
MCHC: 32.1 g/dL (ref 30.0–36.0)
MCV: 84.2 fL (ref 80.0–100.0)
Platelets: 223 10*3/uL (ref 150–400)
RBC: 6.28 MIL/uL — ABNORMAL HIGH (ref 4.22–5.81)
RDW: 14.1 % (ref 11.5–15.5)
WBC: 10.3 10*3/uL (ref 4.0–10.5)
nRBC: 0 % (ref 0.0–0.2)

## 2021-11-13 LAB — BASIC METABOLIC PANEL
Anion gap: 10 (ref 5–15)
BUN: 41 mg/dL — ABNORMAL HIGH (ref 6–20)
CO2: 27 mmol/L (ref 22–32)
Calcium: 9.1 mg/dL (ref 8.9–10.3)
Chloride: 103 mmol/L (ref 98–111)
Creatinine, Ser: 1.78 mg/dL — ABNORMAL HIGH (ref 0.61–1.24)
GFR, Estimated: 50 mL/min — ABNORMAL LOW (ref >60)
Glucose, Bld: 100 mg/dL — ABNORMAL HIGH (ref 70–99)
Potassium: 3.9 mmol/L (ref 3.5–5.1)
Sodium: 140 mmol/L (ref 135–145)

## 2021-11-13 LAB — GLUCOSE, CAPILLARY
Glucose-Capillary: 112 mg/dL — ABNORMAL HIGH (ref 70–99)
Glucose-Capillary: 116 mg/dL — ABNORMAL HIGH (ref 70–99)
Glucose-Capillary: 157 mg/dL — ABNORMAL HIGH (ref 70–99)
Glucose-Capillary: 97 mg/dL (ref 70–99)
Glucose-Capillary: 140 mg/dL — ABNORMAL HIGH (ref 70–99)

## 2021-11-13 LAB — MAGNESIUM: Magnesium: 2.4 mg/dL (ref 1.7–2.4)

## 2021-11-13 LAB — PHOSPHORUS: Phosphorus: 3.5 mg/dL (ref 2.5–4.6)

## 2021-11-13 MED ORDER — ROSUVASTATIN CALCIUM 20 MG PO TABS: 20.0000 mg | ORAL_TABLET | Freq: Every day | ORAL | 0 refills | Status: DC

## 2021-11-13 MED ORDER — BLOOD PRESSURE CONTROL BOOK
Freq: Once | Status: AC
  Filled 2021-11-13: qty 1

## 2021-11-13 MED ORDER — HYDROCHLOROTHIAZIDE 25 MG PO TABS: 25.0000 mg | ORAL_TABLET | Freq: Every day | ORAL | 0 refills | Status: DC

## 2021-11-13 MED ORDER — CARVEDILOL 25 MG PO TABS
25.0000 mg | ORAL_TABLET | Freq: Two times a day (BID) | ORAL | Status: DC
  Administered 2021-11-13 – 2021-11-30 (×33): 25 mg
  Filled 2021-11-13 (×33): qty 1

## 2021-11-13 MED ORDER — PROCHLORPERAZINE EDISYLATE 10 MG/2ML IJ SOLN: 5.0000 mg | Freq: Four times a day (QID) | INTRAMUSCULAR | Status: DC | PRN

## 2021-11-13 MED ORDER — HYDROCHLOROTHIAZIDE 25 MG PO TABS
25.0000 mg | ORAL_TABLET | Freq: Every day | ORAL | Status: DC
  Administered 2021-11-14 – 2021-11-30 (×16): 25 mg
  Filled 2021-11-13 (×16): qty 1

## 2021-11-13 MED ORDER — PROSOURCE TF PO LIQD
45.0000 mL | Freq: Three times a day (TID) | ORAL | Status: DC
  Administered 2021-11-13 – 2021-11-15 (×6): 45 mL
  Filled 2021-11-13 (×6): qty 45

## 2021-11-13 MED ORDER — PROSOURCE TF PO LIQD
45.0000 mL | Freq: Three times a day (TID) | ORAL | Status: DC
Start: 2021-11-13 — End: 2021-11-30

## 2021-11-13 MED ORDER — AMLODIPINE BESYLATE 10 MG PO TABS: 10.0000 mg | ORAL_TABLET | Freq: Every day | ORAL | 0 refills | Status: DC

## 2021-11-13 MED ORDER — PANTOPRAZOLE SODIUM 40 MG PO PACK: 40.0000 mg | PACK | Freq: Every day | ORAL | 0 refills | Status: DC

## 2021-11-13 MED ORDER — PANTOPRAZOLE 2 MG/ML SUSPENSION
40.0000 mg | Freq: Every day | ORAL | Status: DC
  Administered 2021-11-15 – 2021-11-30 (×14): 40 mg
  Filled 2021-11-13 (×12): qty 20

## 2021-11-13 MED ORDER — SPIRONOLACTONE 25 MG PO TABS
50.0000 mg | ORAL_TABLET | Freq: Every day | ORAL | Status: DC
  Administered 2021-11-14 – 2021-11-30 (×16): 50 mg
  Filled 2021-11-13 (×16): qty 2

## 2021-11-13 MED ORDER — INSULIN ASPART 100 UNIT/ML IJ SOLN
0.0000 [IU] | INTRAMUSCULAR | Status: DC
  Administered 2021-11-13 – 2021-11-15 (×4): 2 [IU] via SUBCUTANEOUS
  Administered 2021-11-16: 3 [IU] via SUBCUTANEOUS
  Administered 2021-11-16 – 2021-11-20 (×9): 2 [IU] via SUBCUTANEOUS
  Administered 2021-11-21: 3 [IU] via SUBCUTANEOUS
  Administered 2021-11-21 (×2): 2 [IU] via SUBCUTANEOUS
  Administered 2021-11-21: 3 [IU] via SUBCUTANEOUS
  Administered 2021-11-22 – 2021-11-24 (×3): 2 [IU] via SUBCUTANEOUS
  Administered 2021-11-25: 3 [IU] via SUBCUTANEOUS
  Administered 2021-11-26 – 2021-11-27 (×2): 2 [IU] via SUBCUTANEOUS
  Administered 2021-11-27: 3 [IU] via SUBCUTANEOUS
  Administered 2021-11-27 – 2021-11-29 (×5): 2 [IU] via SUBCUTANEOUS

## 2021-11-13 MED ORDER — EXERCISE FOR HEART AND HEALTH BOOK
Freq: Once | Status: AC
  Filled 2021-11-13: qty 1

## 2021-11-13 MED ORDER — ENOXAPARIN SODIUM 40 MG/0.4ML IJ SOSY
40.0000 mg | PREFILLED_SYRINGE | INTRAMUSCULAR | Status: DC
  Administered 2021-11-13 – 2021-11-29 (×16): 40 mg via SUBCUTANEOUS
  Filled 2021-11-13 (×17): qty 0.4

## 2021-11-13 MED ORDER — ORAL CARE MOUTH RINSE
15.0000 mL | Freq: Two times a day (BID) | OROMUCOSAL | Status: DC
  Administered 2021-11-13 – 2021-11-29 (×29): 15 mL via OROMUCOSAL

## 2021-11-13 MED ORDER — GUAIFENESIN-DM 100-10 MG/5ML PO SYRP
5.0000 mL | ORAL_SOLUTION | Freq: Four times a day (QID) | ORAL | Status: DC | PRN
  Administered 2021-11-18: 10 mL
  Filled 2021-11-13 (×2): qty 10

## 2021-11-13 MED ORDER — POLYETHYLENE GLYCOL 3350 17 G PO PACK
17.0000 g | PACK | Freq: Every day | ORAL | Status: DC
  Administered 2021-11-15 – 2021-11-30 (×15): 17 g
  Filled 2021-11-13 (×15): qty 1

## 2021-11-13 MED ORDER — CHLORHEXIDINE GLUCONATE 0.12 % MT SOLN
15.0000 mL | Freq: Two times a day (BID) | OROMUCOSAL | Status: DC
  Administered 2021-11-13 – 2021-11-30 (×31): 15 mL via OROMUCOSAL
  Filled 2021-11-13 (×31): qty 15

## 2021-11-13 MED ORDER — SPIRONOLACTONE 50 MG PO TABS: 50.0000 mg | ORAL_TABLET | Freq: Every day | ORAL | 0 refills | Status: DC

## 2021-11-13 MED ORDER — ROSUVASTATIN CALCIUM 20 MG PO TABS
20.0000 mg | ORAL_TABLET | Freq: Every day | ORAL | Status: DC
Start: 2021-11-14 — End: 2021-11-30
  Administered 2021-11-14 – 2021-11-30 (×16): 20 mg
  Filled 2021-11-13 (×16): qty 1

## 2021-11-13 MED ORDER — OSMOLITE 1.5 CAL PO LIQD: 1000.0000 mL | ORAL | 0 refills | Status: DC

## 2021-11-13 MED ORDER — FREE WATER
200.0000 mL | Freq: Four times a day (QID) | Status: DC
  Administered 2021-11-13 – 2021-11-15 (×7): 200 mL

## 2021-11-13 MED ORDER — ALUM & MAG HYDROXIDE-SIMETH 200-200-20 MG/5ML PO SUSP: 30.0000 mL | ORAL | Status: DC | PRN

## 2021-11-13 MED ORDER — SENNOSIDES 8.8 MG/5ML PO SYRP
10.0000 mL | ORAL_SOLUTION | Freq: Every day | ORAL | Status: DC
  Administered 2021-11-14: 10 mL
  Filled 2021-11-13 (×5): qty 10

## 2021-11-13 MED ORDER — FLUTICASONE PROPIONATE 50 MCG/ACT NA SUSP
2.0000 | Freq: Two times a day (BID) | NASAL | Status: DC
  Administered 2021-11-13 – 2021-11-29 (×15): 2 via NASAL
  Filled 2021-11-13: qty 16

## 2021-11-13 MED ORDER — TRAZODONE HCL 50 MG PO TABS
25.0000 mg | ORAL_TABLET | Freq: Every evening | ORAL | Status: DC | PRN
  Filled 2021-11-13 (×2): qty 1

## 2021-11-13 MED ORDER — PROCHLORPERAZINE 25 MG RE SUPP: 12.5000 mg | Freq: Four times a day (QID) | RECTAL | Status: DC | PRN

## 2021-11-13 MED ORDER — CLONIDINE HCL 0.2 MG PO TABS: 0.2000 mg | ORAL_TABLET | Freq: Three times a day (TID) | ORAL | 0 refills | Status: DC

## 2021-11-13 MED ORDER — ACETAMINOPHEN 325 MG PO TABS
325.0000 mg | ORAL_TABLET | ORAL | Status: DC | PRN
  Filled 2021-11-13: qty 2

## 2021-11-13 MED ORDER — CHLORHEXIDINE GLUCONATE CLOTH 2 % EX PADS
6.0000 | MEDICATED_PAD | Freq: Every day | CUTANEOUS | Status: DC
  Administered 2021-11-13 – 2021-11-30 (×18): 6 via TOPICAL

## 2021-11-13 MED ORDER — CARVEDILOL 25 MG PO TABS: 25.0000 mg | ORAL_TABLET | Freq: Two times a day (BID) | ORAL | 0 refills | Status: DC

## 2021-11-13 MED ORDER — OSMOLITE 1.5 CAL PO LIQD
1000.0000 mL | ORAL | Status: DC
  Administered 2021-11-13 – 2021-11-14 (×2): 1000 mL
  Filled 2021-11-13 (×2): qty 1000

## 2021-11-13 MED ORDER — PROCHLORPERAZINE MALEATE 5 MG PO TABS: 5.0000 mg | ORAL_TABLET | Freq: Four times a day (QID) | ORAL | Status: DC | PRN

## 2021-11-13 MED ORDER — HYDRALAZINE HCL 50 MG PO TABS: 50.0000 mg | ORAL_TABLET | Freq: Three times a day (TID) | ORAL | 0 refills | Status: DC

## 2021-11-13 MED ORDER — WHITE PETROLATUM EX OINT
TOPICAL_OINTMENT | CUTANEOUS | Status: DC | PRN
  Filled 2021-11-13 (×2): qty 28.35

## 2021-11-13 MED ORDER — AMLODIPINE BESYLATE 10 MG PO TABS
10.0000 mg | ORAL_TABLET | Freq: Every day | ORAL | Status: DC
  Administered 2021-11-14 – 2021-11-30 (×16): 10 mg
  Filled 2021-11-13 (×16): qty 1

## 2021-11-13 MED ORDER — CLONIDINE HCL 0.1 MG PO TABS
0.2000 mg | ORAL_TABLET | Freq: Three times a day (TID) | ORAL | Status: DC
  Administered 2021-11-13 – 2021-11-18 (×17): 0.2 mg
  Filled 2021-11-13 (×17): qty 2

## 2021-11-13 MED ORDER — FLEET ENEMA 7-19 GM/118ML RE ENEM: 1.0000 | ENEMA | Freq: Once | RECTAL | Status: DC | PRN

## 2021-11-13 MED ORDER — HYDRALAZINE HCL 50 MG PO TABS
50.0000 mg | ORAL_TABLET | Freq: Three times a day (TID) | ORAL | Status: DC
  Administered 2021-11-13 – 2021-11-22 (×26): 50 mg
  Filled 2021-11-13 (×27): qty 1

## 2021-11-13 MED ORDER — DIPHENHYDRAMINE HCL 12.5 MG/5ML PO ELIX: 12.5000 mg | ORAL_SOLUTION | Freq: Four times a day (QID) | ORAL | Status: DC | PRN

## 2021-11-13 MED ORDER — BISACODYL 10 MG RE SUPP
10.0000 mg | Freq: Every day | RECTAL | Status: DC | PRN
  Administered 2021-11-29: 10 mg via RECTAL
  Filled 2021-11-13: qty 1
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
## 2021-11-13 NOTE — Progress Notes (Signed)
Inpatient Rehabilitation Admission Medication Review by a Pharmacist ? ?A complete drug regimen review was completed for this patient to identify any potential clinically significant medication issues. ? ?High Risk Drug Classes Is patient taking? Indication by Medication  ?Antipsychotic Yes PRN compazine for N/V  ?Anticoagulant Yes Lovenox for VTE ppx  ?Antibiotic No   ?Opioid No   ?Antiplatelet No   ?Hypoglycemics/insulin Yes SSI for DM  ?Vasoactive Medication Yes Amlodipine, carvedilol, clonidine, hydralazine, HCTZ, spiro for BP control  ?Chemotherapy No   ?Other Yes Protonix for GERD ?Crestor for HLD  ? ? ? ?Type of Medication Issue Identified Description of Issue Recommendation(s)  ?Drug Interaction(s) (clinically significant) ?    ?Duplicate Therapy ?    ?Allergy ?    ?No Medication Administration End Date ?    ?Incorrect Dose ?    ?Additional Drug Therapy Needed ?    ?Significant med changes from prior encounter (inform family/care partners about these prior to discharge).    ?Other ?    ? ? ?Clinically significant medication issues were identified that warrant physician communication and completion of prescribed/recommended actions by midnight of the next day:  No ? ?Pharmacist comments: None ? ?Time spent performing this drug regimen review (minutes):  20 minutes ? ? ?Tad Moore ?11/13/2021 3:10 PM ?

## 2021-11-13 NOTE — Progress Notes (Signed)
Discharged to acute rehab after therapy walked him to the hallway to his w/c.  Attempted to call mom to notify.  Also attempted to call mom for info for his shunt to get info so that he may be able to have MRI but a message was left.   ?

## 2021-11-13 NOTE — Progress Notes (Incomplete)
Pt admitted to unit completely asphasic unable to  participate in admission database completion.  ?
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
## 2021-11-13 NOTE — Plan of Care (Signed)
?  Problem: Intracerebral Hemorrhage Tissue Perfusion: ?Goal: Complications of Intracerebral Hemorrhage will be minimized ?Outcome: Not Progressing ?  ?Problem: Nutrition: ?Goal: Risk of aspiration will decrease ?Outcome: Not Progressing ?  ?Problem: Education: ?Goal: Knowledge of General Education information will improve ?Description: Including pain rating scale, medication(s)/side effects and non-pharmacologic comfort measures ?Outcome: Not Progressing ?  ?Problem: Clinical Measurements: ?Goal: Diagnostic test results will improve ?Outcome: Not Progressing ?  ?

## 2021-11-13 NOTE — Assessment & Plan Note (Signed)
Foley in place ?Consider trial of void within next few days ?

## 2021-11-13 NOTE — Progress Notes (Signed)
Physical Therapy Treatment ?Patient Details ?Name: Paul [name] ?MRN: [id number] ?DOB: 09-12-1985 ?Today's Date: 11/13/2021 ? ? ?History of Present Illness The pt is a 36 yo male presenting 3/18 with aphasia. CT revealed ICH of L basal ganglia, BP 262/176. During admission, pt developed resp distress and pulmonary edema, placed on Bipap. CXR 3/20 shows Interval development of a small right apical pneumothorax, less than 5% volume, now resolved. 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 ? ?  ?PT Comments  ? ? Pt progressing well.  Emphasis on OOB activity including sit to standing, standing activity with pre gait and progression of gait training in the rW. ?   ?Recommendations for follow up therapy are one component of a multi-disciplinary discharge planning process, led by the attending physician.  Recommendations may be updated based on patient status, additional functional criteria and insurance authorization. ? ?Follow Up Recommendations ? Acute inpatient rehab (3hours/day) ?  ?  ?Assistance Recommended at Discharge Frequent or constant Supervision/Assistance  ?Patient can return home with the following A lot of help with bathing/dressing/bathroom;Assistance with cooking/housework;Direct supervision/assist for medications management;Assist for transportation;Help with stairs or ramp for entrance;A lot of help with walking and/or transfers ?  ?Equipment Recommendations ? Other (comment) (TBD)  ?  ?Recommendations for Other Services Rehab consult ? ? ?  ?Precautions / Restrictions Precautions ?Precautions: Fall  ?  ? ?Mobility ? Bed Mobility ?Overal bed mobility: Needs Assistance ?Bed Mobility: Supine to Sit, Sit to Supine ?  ?  ?Supine to sit: Min guard ?  ?  ?General bed mobility comments: needed time to roll over to R side and come up with majority of L side assist and no external assist. ?  ? ?Transfers ?Overall transfer level: Needs  assistance ?Equipment used: Rolling walker (2 wheels) ?Transfers: Sit to/from Stand ?Sit to Stand: Mod assist ?  ?  ?  ?  ?  ?General transfer comment: cues for hand placement.  Assist R hand for positioning so pt able to use for sit to stand. ?  ? ?Ambulation/Gait ?Ambulation/Gait assistance: Mod assist ?Gait Distance (Feet): 25 Feet ?Assistive device: Rolling walker (2 wheels) ?  ?  ?Gait velocity interpretation: <1.31 ft/sec, indicative of household ambulator ?  ?General Gait Details: y paretic gait on the R LE, needing w/shift and stability to unweight R LE, but advances with cuing off toe to a flat contact.  Pt needs consistent guarding and facilitation to extend his R knee in a somewhat coordinated manner while stepping with the L foot. ? ? ?Stairs ?  ?  ?  ?  ?  ? ? ?Wheelchair Mobility ?  ? ?Modified Rankin (Stroke Patients Only) ?Modified Rankin (Stroke Patients Only) ?Modified Rankin: Moderately severe disability ? ? ?  ?Balance Overall balance assessment: Needs assistance ?Sitting-balance support: No upper extremity supported, Feet supported ?Sitting balance-Leahy Scale: Good ?  ?  ?Standing balance support: Bilateral upper extremity supported, During functional activity ?Standing balance-Leahy Scale: Poor ?Standing balance comment: Pt stood >5 min initially  at EOB to clean with with disinfecting wipe and general peri care.started with pre-gait activity including w/shifting and stepping prior to gait.  Pt reliant on AD and external support. ?  ?  ?  ?  ?  ?  ?  ?  ?  ?  ?  ?  ? ?  ?Cognition Arousal/Alertness: Awake/alert ?Behavior During Therapy: Flat affect ?Overall Cognitive Status: Within Functional Limits for  tasks assessed (follows commands well,) ?  ?  ?  ?  ?  ?  ?  ?  ?  ?  ?  ?  ?  ?  ?  ?  ?General Comments: Again today with expressive difficulties, uses phone for text communication otherwise consistently nods yes/no to questions. Pt with quick processing time throughout session, continued R  inattention but attends when cued. ?  ?  ? ?  ?Exercises   ? ?  ?General Comments   ?  ?  ? ?Pertinent Vitals/Pain Pain Assessment ?Faces Pain Scale: No hurt  ? ? ?Home Living   ?  ?  ?  ?  ?  ?  ?  ?  ?  ?   ?  ?Prior Function    ?  ?  ?   ? ?PT Goals (current goals can now be found in the care plan section) Acute Rehab PT Goals ?PT Goal Formulation: With patient ?Time For Goal Achievement: 11/21/21 ?Potential to Achieve Goals: Good ?Progress towards PT goals: Progressing toward goals ? ?  ?Frequency ? ? ? Min 4X/week ? ? ? ?  ?PT Plan Current plan remains appropriate  ? ? ?Co-evaluation   ?  ?  ?  ?  ? ?  ?AM-PAC PT "6 Clicks" Mobility   ?Outcome Measure ? Help needed turning from your back to your side while in a flat bed without using bedrails?: A Little ?Help needed moving from lying on your back to sitting on the side of a flat bed without using bedrails?: A Little ?Help needed moving to and from a bed to a chair (including a wheelchair)?: A Lot ?Help needed standing up from a chair using your arms (e.g., wheelchair or bedside chair)?: A Lot ?Help needed to walk in hospital room?: A Lot ?Help needed climbing 3-5 steps with a railing? : Total ?6 Click Score: 13 ? ?  ?End of Session   ?Activity Tolerance: Patient tolerated treatment well ?Patient left: with call bell/phone within reach;Other (comment) (transport chair to AIR) ?Nurse Communication: Mobility status ?PT Visit Diagnosis: Unsteadiness on feet (R26.81);Other abnormalities of gait and mobility (R26.89);Muscle weakness (generalized) (M62.81);Hemiplegia and hemiparesis ?Hemiplegia - Right/Left: Right ?Hemiplegia - dominant/non-dominant: Dominant ?Hemiplegia - caused by: Nontraumatic intracerebral hemorrhage ?  ? ? ?Time: 7078-6754 ?PT Time Calculation (min) (ACUTE ONLY): 23 min ? ?Charges:  $Gait Training: 8-22 mins ?$Neuromuscular Re-education: 8-22 mins          ?          ? ?11/13/2021 ? ?Jacinto Halim., PT ?Acute Rehabilitation Services ?[phone]   (pager) ?[phone]  (office) ? ? ?Paul [name] ?11/13/2021, 4:06 PM ? ?

## 2021-11-13 NOTE — Plan of Care (Signed)
?  Problem: Education: ?Goal: Knowledge of disease or condition will improve ?Outcome: Adequate for Discharge ?Goal: Knowledge of secondary prevention will improve (SELECT ALL) ?Outcome: Adequate for Discharge ?Goal: Knowledge of patient specific risk factors will improve (INDIVIDUALIZE FOR PATIENT) ?Outcome: Adequate for Discharge ?Goal: Individualized Educational Video(s) ?Outcome: Adequate for Discharge ?  ?Problem: Coping: ?Goal: Will verbalize positive feelings about self ?Outcome: Adequate for Discharge ?Goal: Will identify appropriate support needs ?Outcome: Adequate for Discharge ?  ?Problem: Health Behavior/Discharge Planning: ?Goal: Ability to manage health-related needs will improve ?Outcome: Adequate for Discharge ?  ?Problem: Self-Care: ?Goal: Ability to communicate needs accurately will improve ?Outcome: Adequate for Discharge ?  ?Problem: Nutrition: ?Goal: Risk of aspiration will decrease ?Outcome: Adequate for Discharge ?  ?Problem: Intracerebral Hemorrhage Tissue Perfusion: ?Goal: Complications of Intracerebral Hemorrhage will be minimized ?Outcome: Adequate for Discharge ?  ?Problem: Education: ?Goal: Knowledge of General Education information will improve ?Description: Including pain rating scale, medication(s)/side effects and non-pharmacologic comfort measures ?Outcome: Adequate for Discharge ?  ?Problem: Health Behavior/Discharge Planning: ?Goal: Ability to manage health-related needs will improve ?Outcome: Adequate for Discharge ?  ?Problem: Clinical Measurements: ?Goal: Ability to maintain clinical measurements within normal limits will improve ?Outcome: Adequate for Discharge ?Goal: Will remain free from infection ?Outcome: Adequate for Discharge ?Goal: Diagnostic test results will improve ?Outcome: Adequate for Discharge ?Goal: Respiratory complications will improve ?Outcome: Adequate for Discharge ?Goal: Cardiovascular complication will be avoided ?Outcome: Adequate for Discharge ?   ?Problem: Activity: ?Goal: Risk for activity intolerance will decrease ?Outcome: Adequate for Discharge ?  ?Problem: Nutrition: ?Goal: Adequate nutrition will be maintained ?Outcome: Adequate for Discharge ?  ?Problem: Coping: ?Goal: Level of anxiety will decrease ?Outcome: Adequate for Discharge ?  ?Problem: Elimination: ?Goal: Will not experience complications related to bowel motility ?Outcome: Adequate for Discharge ?Goal: Will not experience complications related to urinary retention ?Outcome: Adequate for Discharge ?  ?Problem: Pain Managment: ?Goal: General experience of comfort will improve ?Outcome: Adequate for Discharge ?  ?Problem: Safety: ?Goal: Ability to remain free from injury will improve ?Outcome: Adequate for Discharge ?  ?Problem: Skin Integrity: ?Goal: Risk for impaired skin integrity will decrease ?Outcome: Adequate for Discharge ?  ?

## 2021-11-14 ENCOUNTER — Other Ambulatory Visit: Payer: Self-pay

## 2021-11-14 LAB — GLUCOSE, CAPILLARY
Glucose-Capillary: 112 mg/dL — ABNORMAL HIGH (ref 70–99)
Glucose-Capillary: 142 mg/dL — ABNORMAL HIGH (ref 70–99)
Glucose-Capillary: 72 mg/dL (ref 70–99)
Glucose-Capillary: 97 mg/dL (ref 70–99)
Glucose-Capillary: 98 mg/dL (ref 70–99)
Glucose-Capillary: 99 mg/dL (ref 70–99)

## 2021-11-14 MED ORDER — LIP MEDEX EX OINT
TOPICAL_OINTMENT | CUTANEOUS | Status: DC | PRN
  Filled 2021-11-14: qty 7

## 2021-11-14 NOTE — Plan of Care (Signed)
?  Problem: Consults ?Goal: RH STROKE PATIENT EDUCATION ?Description: See Patient Education module for education specifics  ?Outcome: Progressing ?  ?Problem: RH BOWEL ELIMINATION ?Goal: RH STG MANAGE BOWEL WITH ASSISTANCE ?Description: STG Manage Bowel with mod I assistance. ?Outcome: Progressing ?Goal: RH STG MANAGE BOWEL W/MEDICATION W/ASSISTANCE ?Description: STG Manage Bowel with Medication with mod I Assistance. ?Outcome: Progressing ?  ?Problem: RH BLADDER ELIMINATION ?Goal: RH STG MANAGE BLADDER WITH ASSISTANCE ?Description: STG Manage Bladder With total Assistance ?Outcome: Progressing ?Goal: RH STG MANAGE BLADDER WITH EQUIPMENT WITH ASSISTANCE ?Description: STG Manage Bladder With Equipment With total Assistance ?Outcome: Progressing ?  ?Problem: RH SAFETY ?Goal: RH STG ADHERE TO SAFETY PRECAUTIONS W/ASSISTANCE/DEVICE ?Description: STG Adhere to Safety Precautions With cues Assistance/Device. ?Outcome: Progressing ?  ?Problem: RH KNOWLEDGE DEFICIT ?Goal: RH STG INCREASE KNOWLEDGE OF HYPERTENSION ?Description: Patient and family will be able to manage HTN with medications and dietary modifications using  handouts and educational resources independently ?Outcome: Progressing ?Goal: RH STG INCREASE KNOWLEDGE OF DYSPHAGIA/FLUID INTAKE ?Description: Patient and family will be able to manage dysphagia, medications and dietary modifications using  handouts and educational resources independently ?Outcome: Progressing ?Goal: RH STG INCREASE KNOWLEGDE OF HYPERLIPIDEMIA ?Description: Patient and family will be able to manage HLD with medications and dietary modifications using  handouts and educational resources independently ?Outcome: Progressing ?Goal: RH STG INCREASE KNOWLEDGE OF STROKE PROPHYLAXIS ?Description: Patient and family will be able to manage secondary stroke risks, medications and dietary modifications using  handouts and educational resources independently ?Outcome: Progressing ?  ?

## 2021-11-14 NOTE — Progress Notes (Signed)
?                                                       PROGRESS NOTE ? ? ?Subjective/Complaints: ?Pt with uneventful night. Was sleeping when I arrived. Appeared fairly comfortable ? ?ROS: Limited due to cognitive/behavioral  ? ? ?Objective: ?  ?No results found. ?Recent Labs  ?  11/12/21 ?0205 11/13/21 ?0409  ?WBC 7.8 10.3  ?HGB 16.5 17.0  ?HCT 52.4* 52.9*  ?PLT 209 223  ? ?Recent Labs  ?  11/12/21 ?0205 11/13/21 ?0409  ?NA 141 140  ?K 3.8 3.9  ?CL 100 103  ?CO2 32 27  ?GLUCOSE 104* 100*  ?BUN 43* 41*  ?CREATININE 2.05* 1.78*  ?CALCIUM 9.2 9.1  ? ? ?Intake/Output Summary (Last 24 hours) at 11/14/2021 9735 ?Last data filed at 11/13/2021 1841 ?Gross per 24 hour  ?Intake 517 ml  ?Output 400 ml  ?Net 117 ml  ?  ? ?  ? ?Physical Exam: ?Vital Signs ?Blood pressure 140/85, pulse (!) 59, temperature 98 ?F (36.7 ?C), resp. rate 16, height 6\' 3"  (1.905 m), weight 129.1 kg, SpO2 94 %. ? ?General: Alert and oriented x 3, No apparent distress ?HEENT: Head is normocephalic, atraumatic, PERRLA, EOMI, sclera anicteric, oral mucosa pink and moist, dentition intact, ext ear canals clear, NGT ?Neck: Supple without JVD or lymphadenopathy ?Heart: Reg rate and rhythm. No murmurs rubs or gallops ?Chest: CTA bilaterally without wheezes, rales, or rhonchi; no distress ?Abdomen: Soft, non-tender, non-distended, bowel sounds positive. ?Extremities: No clubbing, cyanosis, or edema. Pulses are 2+ ?Psych: Pt's affect is appropriate. Pt is cooperative ?Skin: Clean and intact without signs of breakdown ?Neuro:  pt alert. Right CN7 and CN6 deficits. Poor oro-motor control. Speech dysarthric,dysphonic. Has word finding deficits but comprehension appears reasonable.  RUE 2+ prox to 1/5 distally. RLE 3-4/5. LUE and LLE 5/5. Senses pain on right ?Uro: foley ?Musculoskeletal: tolerated limb rom without pain.   ? ? ?Assessment/Plan: ?1. Functional deficits which require 3+ hours per day of interdisciplinary therapy in a comprehensive inpatient rehab  setting. ?Physiatrist is providing close team supervision and 24 hour management of active medical problems listed below. ?Physiatrist and rehab team continue to assess barriers to discharge/monitor patient progress toward functional and medical goals ? ?Care Tool: ? ?Bathing ?   ?   ?   ?  ?  ?Bathing assist   ?  ?  ?Upper Body Dressing/Undressing ?Upper body dressing   ?What is the patient wearing?: Hospital gown only ?   ?Upper body assist   ?   ?Lower Body Dressing/Undressing ?Lower body dressing ? ? ?   ?  ? ?  ? ?Lower body assist   ?   ? ?Toileting ?Toileting    ?Toileting assist   ?  ?  ?Transfers ?Chair/bed transfer ? ?Transfers assist ?   ? ?  ?  ?  ?Locomotion ?Ambulation ? ? ?Ambulation assist ? ?   ? ?  ?  ?   ? ?Walk 10 feet activity ? ? ?Assist ?   ? ?  ?   ? ?Walk 50 feet activity ? ? ?Assist   ? ?  ?   ? ? ?Walk 150 feet activity ? ? ?Assist   ? ?  ?  ?  ? ?Walk 10 feet on uneven  surface  ?activity ? ? ?Assist   ? ? ?  ?   ? ?Wheelchair ? ? ? ? ?Assist   ?  ?  ? ?  ?   ? ? ?Wheelchair 50 feet with 2 turns activity ? ? ? ?Assist ? ?  ?  ? ? ?   ? ?Wheelchair 150 feet activity  ? ? ? ?Assist ?   ? ? ?   ? ?Blood pressure 140/85, pulse (!) 59, temperature 98 ?F (36.7 ?C), resp. rate 16, height 6\' 3"  (1.905 m), weight 129.1 kg, SpO2 94 %. ? ?Medical Problem List and Plan: ?1. Functional deficits secondary to left basal ganglia ICH ?            -patient may shower ?            -ELOS/Goals: 10-14 days, supervision with PT,OT and sup/min with SLP ? -Patient is beginning CIR therapies today including PT, OT, and SLP  ?2.  Antithrombotics: ?-DVT/anticoagulation:  Pharmaceutical: Lovenox ?            -antiplatelet therapy: N/A due to bleed ?3. Pain Management: tylenol prn.  ?4. Mood: LCSW to follow for evaluation and support when appropriate.  ?            -antipsychotic agents: N?A ?5. Neuropsych: This patient is not fully capable of making decisions on his own behalf. ?6. Skin/Wound Care: Routine pressure  relief measures.  ?7. Fluids/Electrolytes/Nutrition: Monitor I/O. Check CMET on Monday ?8.  HTN: Monitor BP TID- continue Catapres, Norvasc, Coreg, hydralazine and HCTZ, and spironolactone\ ? -SBP elevated at times but generally improved ? -continue to follow closely ?            --monitor lytes as well with diuretics on board.  ?-check Labs monday ?9. Severe dysphagia: Continue NPO with tube feeds for nutritional support. ?            -provide bedside suction for now--pt uses it on his own ?10. Acute on chronic renal failure: SCr 1.4 at baseline?-->1.78 3/25 ?            --continue to monitor with serial checks.  ?            --added water flushes.  ? -may need renal f/u as outpt ?11. Polycythemia: Hgb 19.4, JAK2 genetic testing still pending.  ?12. Urinary retention: continue foley ?            -consider voiding trial beginning of week depending upon how well he moves.  ?13. 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 Flash pulmonary edema/acute respiratory failure: Weaned off BIPAP by 03/22 ?            --Monitor daily wts  down from 141-->137 kg. ?15. Cardiac amyloidosis?: Cardiac MRI pending ?  ? ?LOS: ?1 days ?A FACE TO FACE EVALUATION WAS PERFORMED ? ?4/22 ?11/14/2021, 9:27 AM  ? ?  ?

## 2021-11-14 NOTE — Plan of Care (Signed)
?  Problem: RH Balance ?Goal: LTG Patient will maintain dynamic sitting balance (PT) ?Description: LTG:  Patient will maintain dynamic sitting balance with assistance during mobility activities (PT) ?Flowsheets (Taken 11/14/2021 1256) ?LTG: Pt will maintain dynamic sitting balance during mobility activities with:: Independent ?Goal: LTG Patient will maintain dynamic standing balance (PT) ?Description: LTG:  Patient will maintain dynamic standing balance with assistance during mobility activities (PT) ?Flowsheets (Taken 11/14/2021 1256) ?LTG: Pt will maintain dynamic standing balance during mobility activities with:: Independent with assistive device  ?  ?Problem: Sit to Stand ?Goal: LTG:  Patient will perform sit to stand with assistance level (PT) ?Description: LTG:  Patient will perform sit to stand with assistance level (PT) ?Flowsheets (Taken 11/14/2021 1256) ?LTG: PT will perform sit to stand in preparation for functional mobility with assistance level: Independent with assistive device ?  ?Problem: RH Bed Mobility ?Goal: LTG Patient will perform bed mobility with assist (PT) ?Description: LTG: Patient will perform bed mobility with assistance, with/without cues (PT). ?Flowsheets (Taken 11/14/2021 1256) ?LTG: Pt will perform bed mobility with assistance level of: Independent ?  ?Problem: RH Bed to Chair Transfers ?Goal: LTG Patient will perform bed/chair transfers w/assist (PT) ?Description: LTG: Patient will perform bed to chair transfers with assistance (PT). ?Flowsheets (Taken 11/14/2021 1256) ?LTG: Pt will perform Bed to Chair Transfers with assistance level: Supervision/Verbal cueing ?  ?Problem: RH Car Transfers ?Goal: LTG Patient will perform car transfers with assist (PT) ?Description: LTG: Patient will perform car transfers with assistance (PT). ?Flowsheets (Taken 11/14/2021 1256) ?LTG: Pt will perform car transfers with assist:: Supervision/Verbal cueing ?  ?Problem: RH Ambulation ?Goal: LTG Patient will  ambulate in controlled environment (PT) ?Description: LTG: Patient will ambulate in a controlled environment, # of feet with assistance (PT). ?Flowsheets (Taken 11/14/2021 1256) ?LTG: Pt will ambulate in controlled environ  assist needed:: Supervision/Verbal cueing ?LTG: Ambulation distance in controlled environment: >200 ft using LRAD ?Goal: LTG Patient will ambulate in home environment (PT) ?Description: LTG: Patient will ambulate in home environment, # of feet with assistance (PT). ?Flowsheets (Taken 11/14/2021 1256) ?LTG: Pt will ambulate in home environ  assist needed:: Supervision/Verbal cueing ?LTG: Ambulation distance in home environment: 50 feet using LRAD ?  ?Problem: RH Stairs ?Goal: LTG Patient will ambulate up and down stairs w/assist (PT) ?Description: LTG: Patient will ambulate up and down # of stairs with assistance (PT) ?Flowsheets (Taken 11/14/2021 1256) ?LTG: Pt will ambulate up/down stairs assist needed:: Contact Guard/Touching assist ?LTG: Pt will  ambulate up and down number of stairs: 8 steps usng LRAD for improved activity tolerance and community/home access. ?  ?

## 2021-11-14 NOTE — Progress Notes (Signed)
RN called MRI to follow up regarding questions about patient's shunt. Per MRI, order has been discontinued.  ?
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
## 2021-11-14 NOTE — Plan of Care (Signed)
?  Problem: RH Eating ?Goal: LTG Patient will perform eating w/assist, cues/equip (OT) ?Description: LTG: Patient will perform eating with assist, with/without cues using equipment (OT) ?Flowsheets (Taken 11/14/2021 1352) ?LTG: Pt will perform eating with assistance level of: Set up assist  ?  ?Problem: RH Grooming ?Goal: LTG Patient will perform grooming w/assist,cues/equip (OT) ?Description: LTG: Patient will perform grooming with assist, with/without cues using equipment (OT) ?Flowsheets (Taken 11/14/2021 1352) ?LTG: Pt will perform grooming with assistance level of: Independent with assistive device  ?  ?Problem: RH Bathing ?Goal: LTG Patient will bathe all body parts with assist levels (OT) ?Description: LTG: Patient will bathe all body parts with assist levels (OT) ?Flowsheets (Taken 11/14/2021 1352) ?LTG: Pt will perform bathing with assistance level/cueing: Contact Guard/Touching assist ?LTG: Position pt will perform bathing: Shower ?  ?Problem: RH Dressing ?Goal: LTG Patient will perform upper body dressing (OT) ?Description: LTG Patient will perform upper body dressing with assist, with/without cues (OT). ?Flowsheets (Taken 11/14/2021 1352) ?LTG: Pt will perform upper body dressing with assistance level of: Set up assist ?Goal: LTG Patient will perform lower body dressing w/assist (OT) ?Description: LTG: Patient will perform lower body dressing with assist, with/without cues in positioning using equipment (OT) ?Flowsheets (Taken 11/14/2021 1352) ?LTG: Pt will perform lower body dressing with assistance level of: Supervision/Verbal cueing ?  ?Problem: RH Toileting ?Goal: LTG Patient will perform toileting task (3/3 steps) with assistance level (OT) ?Description: LTG: Patient will perform toileting task (3/3 steps) with assistance level (OT)  ?Flowsheets (Taken 11/14/2021 1352) ?LTG: Pt will perform toileting task (3/3 steps) with assistance level: Set up assist ?  ?Problem: RH Toilet Transfers ?Goal: LTG Patient  will perform toilet transfers w/assist (OT) ?Description: LTG: Patient will perform toilet transfers with assist, with/without cues using equipment (OT) ?Flowsheets (Taken 11/14/2021 1352) ?LTG: Pt will perform toilet transfers with assistance level of: Supervision/Verbal cueing ?  ?Problem: RH Tub/Shower Transfers ?Goal: LTG Patient will perform tub/shower transfers w/assist (OT) ?Description: LTG: Patient will perform tub/shower transfers with assist, with/without cues using equipment (OT) ?Flowsheets (Taken 11/14/2021 1352) ?LTG: Pt will perform tub/shower stall transfers with assistance level of: Contact Guard/Touching assist ?  ?

## 2021-11-15 ENCOUNTER — Telehealth: Payer: Self-pay | Admitting: Student

## 2021-11-15 ENCOUNTER — Other Ambulatory Visit: Payer: Self-pay | Admitting: Student

## 2021-11-15 LAB — GLUCOSE, CAPILLARY
Glucose-Capillary: 106 mg/dL — ABNORMAL HIGH (ref 70–99)
Glucose-Capillary: 116 mg/dL — ABNORMAL HIGH (ref 70–99)
Glucose-Capillary: 137 mg/dL — ABNORMAL HIGH (ref 70–99)
Glucose-Capillary: 142 mg/dL — ABNORMAL HIGH (ref 70–99)
Glucose-Capillary: 143 mg/dL — ABNORMAL HIGH (ref 70–99)
Glucose-Capillary: 75 mg/dL (ref 70–99)

## 2021-11-15 LAB — CBC WITH DIFFERENTIAL/PLATELET
Abs Immature Granulocytes: 0.04 10*3/uL (ref 0.00–0.07)
Basophils Absolute: 0.1 10*3/uL (ref 0.0–0.1)
Basophils Relative: 1 %
Eosinophils Absolute: 0.2 10*3/uL (ref 0.0–0.5)
Eosinophils Relative: 3 %
HCT: 54.7 % — ABNORMAL HIGH (ref 39.0–52.0)
Hemoglobin: 17.7 g/dL — ABNORMAL HIGH (ref 13.0–17.0)
Immature Granulocytes: 1 %
Lymphocytes Relative: 12 %
Lymphs Abs: 1 10*3/uL (ref 0.7–4.0)
MCH: 27.1 pg (ref 26.0–34.0)
MCHC: 32.4 g/dL (ref 30.0–36.0)
MCV: 83.8 fL (ref 80.0–100.0)
Monocytes Absolute: 0.7 10*3/uL (ref 0.1–1.0)
Monocytes Relative: 8 %
Neutro Abs: 6.5 10*3/uL (ref 1.7–7.7)
Neutrophils Relative %: 75 %
Platelets: 228 10*3/uL (ref 150–400)
RBC: 6.53 MIL/uL — ABNORMAL HIGH (ref 4.22–5.81)
RDW: 14.3 % (ref 11.5–15.5)
WBC: 8.5 10*3/uL (ref 4.0–10.5)
nRBC: 0 % (ref 0.0–0.2)

## 2021-11-15 LAB — MAGNESIUM: Magnesium: 2.4 mg/dL (ref 1.7–2.4)

## 2021-11-15 MED ORDER — FREE WATER
200.0000 mL | Status: DC
  Administered 2021-11-15 – 2021-11-22 (×40): 200 mL

## 2021-11-15 MED ORDER — PROSOURCE TF PO LIQD
90.0000 mL | Freq: Three times a day (TID) | ORAL | Status: DC
  Administered 2021-11-15 – 2021-11-22 (×20): 90 mL
  Filled 2021-11-15 (×20): qty 90

## 2021-11-15 MED ORDER — OSMOLITE 1.5 CAL PO LIQD
1000.0000 mL | ORAL | Status: DC
  Administered 2021-11-15 – 2021-11-21 (×7): 1000 mL
  Filled 2021-11-15 (×7): qty 1000

## 2021-11-15 NOTE — Progress Notes (Signed)
Occupational Therapy Session Note ? ?Patient Details  ?Name: Paul [name] ?MRN: [id number] ?Date of Birth: 10-05-1985 ? ?Today's Date: 11/15/2021 ?OT Individual Time: 6378-5885 ?OT Individual Time Calculation (min): 40 min  ? ? ?Short Term Goals: ?Week 1:  OT Short Term Goal 1 (Week 1): Pt will complete UB dressing with supervision ?OT Short Term Goal 2 (Week 1): Pt will complete toileting with min assist ?OT Short Term Goal 3 (Week 1): Pt will complete LB dressing with min assist. ?OT Short Term Goal 4 (Week 1): Pt will demonstrate functional use of RUE as stabilizer with mod cueing to attend. ? ?Skilled Therapeutic Interventions/Progress Updates:  ?Pt greeted seated in recliner agreeable to OT intervention. Session focus on BADL reeducation, functional mobility, dynamic standing balance, RUE FMC and decreasing overall caregiver burden.  Pt completed multiple sit>stands during bathing and dressing with overall MIN A, total A needed to place RUE on RW. Pt completed bathing from recliner with overall MODA, needing assist to wash LUE. Pt completed UB dressing with MODA, education provided on hemi techniques. Pt needed MOD A for LB dressing with pt needing assist to thread pants but able to pull pants up to waist line in standing with MIN A for balance support. Remainder of session worked on [employer], retrieved compliant cube and worked on [employer] with RUE, also worked on passing cube from R hand to L hand with pt needing cues to break down each movement into small steps d/t impaired motor planning. Pt completed stand pivot back to EOB with RW and MODA, supervision for sit>supine.  pt left supine in bed with bed alarm activated and all needs within reach.  ? ?Pt nodding appropriately during session and using thumbs up/down to communicate.                     ? ? ?Therapy Documentation ?Precautions:  ?Precautions ?Precautions: Fall ?Precaution Comments: right hemi, mild right neglect, cortrak, urinary catheter,  BP<160 ?Restrictions ?Weight Bearing Restrictions: No ? ?Pain: no pain reported during session  ? ? ? ?Therapy/Group: Individual Therapy ? ?[name] ?11/15/2021, 11:15 AM ?

## 2021-11-15 NOTE — Progress Notes (Signed)
Inpatient Rehabilitation  Patient information reviewed and entered into eRehab system by Shakiara Lukic Ishaq Maffei, OTR/L.   Information including medical coding, functional ability and quality indicators will be reviewed and updated through discharge.    

## 2021-11-15 NOTE — Progress Notes (Signed)
Physical Therapy Session Note ? ?Patient Details  ?Name: Paul [name] ?MRN: [id number] ?Date of Birth: 12-Sep-1985 ? ?Today's Date: 11/15/2021 ?PT Individual Time: 1791-5056 ?PT Individual Time Calculation (min): 35 min  and Today's Date: 11/15/2021 ?PT Missed Time: 10 Minutes ?Missed Time Reason: Nursing care ? ?Short Term Goals: ?Week 1:  PT Short Term Goal 1 (Week 1): Patient will perform basic transfers with CGA consistently. ?PT Short Term Goal 2 (Week 1): Patient will ambulate >100 feet using LRAD with min A. ?PT Short Term Goal 3 (Week 1): Patient will improve Berg Balance Scale by at least 7 points to meet MCID for improved postural control and reduced fall risk. ? ?Skilled Therapeutic Interventions/Progress Updates:  ?  pt received in bed, asleep but easily roused and agreeable to therapy. RN present  to give meds through cor trak. Pt missed ~15 min for nursing care. Supine>sit with HOB elevated with supervision. Sit to stand without AD with mod A throughout session from EOB and arm chair. Pt ambulated to chair, ~3 ft with mod A for balance. ? ?Pt non verbal throughout session, texting on his phone and answering yes/no type questions with thumbs up/ down and hand signals. Pt also with productive cough requiring session. ? ?Five times Sit to Stand Test (FTSS) ?Method: ?Use a straight back chair with a solid seat that is 16-18? high. Ask participant to sit on the chair with arms folded across their chest.   ?Instructions: ??Stand up and sit down as quickly as possible 5 times, keeping your arms folded across your chest.?   ?Measurement: ?Stop timing when the participant stands the 5th time. ? ?TIME: ___40___ (in seconds) with pt using LUE to push from chair with min for balance. Pt was unable to stand w/o UE support. ? ?Times > 13.6 seconds is associated with increased disability and morbidity (Guralnik, 2000) ?Times > 15 seconds is predictive of recurrent falls in healthy individuals aged 65 and older  (Buatois, et al., 2008) ?Normal performance values in community dwelling individuals aged 35 and older (Bohannon, 2006): ?60-69 years: 11.4 seconds ?70-79 years: 12.6 seconds ?80-89 years: 14.8 seconds ? ?MCID: ? 2.3 seconds for Vestibular Disorders (Meretta, 2006)  ? ? ?Timed up and go: ?Pt performed TUG from arm chair without AD with mod A for balance and occ reaching out to wall for balance: avg. 45.6 sec ?TUG performed with RW: avg. 57 sec. ? Pt with increased time with RW, but requiring heavy min to occ mod A with RW. Did require assist for grip on RW, would require hand splint for more independent use. ?  ?After testing, pt requested to sit in recliner and ambulated with RW, heavy min A, save 1 LOB which required mod A to recover. Pt ambulating with shuffling step through pattern through out. Pt turned and sat in recliner with mod A and VC for technique and hand placement.  ? ?Handed off care to RN to complete meds pass and set up alarm belt at end of session. ? ? ?Therapy Documentation ?Precautions:  ?Precautions ?Precautions: Fall ?Precaution Comments: right hemi, mild right neglect, cortrak, urinary catheter, BP<160 ?Restrictions ?Weight Bearing Restrictions: No ?General: ?PT Amount of Missed Time (min): 10 Minutes ?PT Missed Treatment Reason: Nursing care ? ? ? ?Therapy/Group: Individual Therapy ? ?Marylene Land Kenna Kirn ?11/15/2021, 8:54 AM  ?

## 2021-11-15 NOTE — Progress Notes (Signed)
Speech Language Pathology Daily Session Note ? ?Patient Details  ?Name: Paul [name] ?MRN: [id number] ?Date of Birth: 1986/01/09 ? ?Today's Date: 11/15/2021 ?SLP Individual Time: 6440-3474 ?SLP Individual Time Calculation (min): 45 min ? ?Short Term Goals: ?Week 1: SLP Short Term Goal 1 (Week 1): Patient will consume ice chips/thin liquid trials with min overt s/sx of aspiration to indicate readiness for MBS. ?SLP Short Term Goal 2 (Week 1): Patient will produce phonemes (/a/, /m/) with appropriate voicing in 80% of opportunities given max verbal/visual cues. ?SLP Short Term Goal 3 (Week 1): Patient will demonstrate use of diaphragmatic breathing with minA. ?SLP Short Term Goal 4 (Week 1): Pt will complete effortful swallows during ice chip/water trials to increase pharyngeal strength with minA multimodal cues. ? ?Skilled Therapeutic Interventions: Skilled treatment session focused on communication and swallowing goals. SLP facilitated session by providing oral care via the suction toothbrush. Patient does have a top denture plate that was removed and also soaked in denture cup. Patient able to open mouth on command enough for oral care but demonstrated difficulty maintaining position. Patient also able to protrude his tongue slightly. Patient performed /a/ and /m/ on command with extra time and Min verbal cues. SLP provided patient with a straw to practice mastication (opening and closing oral cavity) while holding the other side of the straw. Patient educated on performing exercises in between sessions. Patient self-fed small ice chips with focus on opening the oral cavity and utilizing his labial musculature to remove from the spoon. Patient with minimal oral manipulation and tilted his head back and to the right to help with AP transit. Patient also given small amounts of thin liquids via a 5 ml syringe while in a reclined position (in recliner). Patient' swallow response appeared delayed with all trials with an  intermittent wet vocal quality noted that he could clear with cues for a throat clear and re-swallow.  Recommend patient remain NPO. Throughout session, patient utilized multimodal communication to express wants/needs. Patient left upright in recliner with alarm on and all needs within reach. Continue with current plan of care.  ?   ? ?Pain ?No/Denies Pain  ? ?Therapy/Group: Individual Therapy ? ?Jakiyah Stepney ?11/15/2021, 3:19 PM ?

## 2021-11-15 NOTE — Progress Notes (Signed)
[facility]patient Rehabilitation Center ?Individual Statement of Services ? ?Patient Name:  Paul [name]  ?Date:  11/15/2021 ? ?Welcome to the Inpatient Rehabilitation Center.  Our goal is to provide you with an individualized program based on your diagnosis and situation, designed to meet your specific needs.  With this comprehensive rehabilitation program, you will be expected to participate in at least 3 hours of rehabilitation therapies Monday-Friday, with modified therapy programming on the weekends. ? ?Your rehabilitation program will include the following services:  Physical Therapy (PT), Occupational Therapy (OT), Speech Therapy (ST), 24 hour per day rehabilitation nursing, Therapeutic Recreaction (TR), Neuropsychology, Care Coordinator, Rehabilitation Medicine, Nutrition Services, Pharmacy Services, and Other ? ?Weekly team conferences will be held on Wednesdays to discuss your progress.  Your Inpatient Rehabilitation Care Coordinator will talk with you frequently to get your input and to update you on team discussions.  Team conferences with you and your family in attendance may also be held. ? ?Expected length of stay:  10-14 Days  Overall anticipated outcome:  Supervision to Min A ? ?Depending on your progress and recovery, your program may change. Your Inpatient Rehabilitation Care Coordinator will coordinate services and will keep you informed of any changes. Your Inpatient Rehabilitation Care Coordinator's name and contact numbers are listed  below. ? ?The following services may also be recommended but are not provided by the Inpatient Rehabilitation Center:  ? ?Home Health Rehabiltiation Services ?Outpatient Rehabilitation Services ? ?  ?Arrangements will be made to provide these services after discharge if needed.  Arrangements include referral to agencies that provide these services. ? ?Your insurance has been verified to be:  uninsured ?Your primary doctor is:  NO PCP ? ?Pertinent information will be  shared with your doctor and your insurance company. ? ?Inpatient Rehabilitation Care Coordinator:  Lavera Guise, [place] [phone] or ([phone] ? ?Information discussed with and copy given to patient by: [name], 11/15/2021, 11:52 AM    ?
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
## 2021-11-15 NOTE — Progress Notes (Signed)
Patient ID: Paul [name], male   DOB: May 17, 1986, 36 y.o.   MRN: [id number] ?Met with the patient to introduce self, review role, rehab process, team conference and plan of care. Reviewed secondary risks including HTN, HLD  (LDLD 136/Trig 209) and DM (A1C 5.1). Reviewed OSA, dietary modification recommendations once on po diet along with foley for urinary retention; patient noted constipation addressed. Continue to follow along to discharge to address educational needs to facilitate preparation for discharge home w mother. [name] ? ?

## 2021-11-15 NOTE — Progress Notes (Signed)
Pharmacy notified via [facility] at 1650 for need of Osmolite. Contacted via secured chat at 1812. Contacted via phone at [phone].  ?
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
## 2021-11-15 NOTE — Progress Notes (Signed)
?                                                       PROGRESS NOTE ? ? ?Subjective/Complaints: ? ?Pt said through nodding head that LBM was yesterday; denies pain; PT asking to [facility] for therapy- I agreed.  ? ?BMP was supposed to be done today- is ordered, but wasn't done. CBC was done.   ? ?ROS: limited due to cognition/ aphasia ?Objective: ?  ?No results found. ?Recent Labs  ?  11/13/21 ?0409 11/15/21 ?0700  ?WBC 10.3 8.5  ?HGB 17.0 17.7*  ?HCT 52.9* 54.7*  ?PLT 223 228  ? ?Recent Labs  ?  11/13/21 ?0409  ?NA 140  ?K 3.9  ?CL 103  ?CO2 27  ?GLUCOSE 100*  ?BUN 41*  ?CREATININE 1.78*  ?CALCIUM 9.1  ? ? ?Intake/Output Summary (Last 24 hours) at 11/15/2021 1446 ?Last data filed at 11/15/2021 1300 ?Gross per 24 hour  ?Intake 100 ml  ?Output 1200 ml  ?Net -1100 ml  ?  ? ?  ? ?Physical Exam: ?Vital Signs ?Blood pressure (!) 143/87, pulse (!) 58, temperature 98.4 ?F (36.9 ?C), temperature source Oral, resp. rate 18, height 6\' 3"  (1.905 m), weight 127.3 kg, SpO2 100 %. ? ? ?General: awake, alert, appropriate, sitting EOB with PT and nursing in room; NAD ?HENT: conjugate gaze; oropharynx dry- has cortrak in place ?CV: regular rate; no JVD ?Pulmonary: CTA B/L; no W/R/R- good air movement ?GI: soft, NT, ND, (+)BS- hypoactive ?Psychiatric: appropriate ?Neurological: nonverbal, but nodding head yes and no ?Skin: Clean and intact without signs of breakdown ?Neuro:  pt alert. Right CN7 and CN6 deficits. Poor oro-motor control. Speech dysarthric,dysphonic. Has word finding deficits but comprehension appears reasonable.  RUE 2+ prox to 1/5 distally. RLE 3-4/5. LUE and LLE 5/5. Senses pain on right ?Uro: foley ?Musculoskeletal: tolerated limb rom without pain.   ? ? ?Assessment/Plan: ?1. Functional deficits which require 3+ hours per day of interdisciplinary therapy in a comprehensive inpatient rehab setting. ?Physiatrist is providing close team supervision and 24 hour management of active medical problems listed  below. ?Physiatrist and rehab team continue to assess barriers to discharge/monitor patient progress toward functional and medical goals ? ?Care Tool: ? ?Bathing ?   ?Body parts bathed by patient: Right arm, Chest, Abdomen, Front perineal area, Buttocks  ? Body parts bathed by helper: Left arm ?  ?  ?Bathing assist Assist Level: Moderate Assistance - Patient 50 - 74% ?  ?  ?Upper Body Dressing/Undressing ?Upper body dressing   ?What is the patient wearing?: Pull over shirt ?   ?Upper body assist Assist Level: Moderate Assistance - Patient 50 - 74% ?   ?Lower Body Dressing/Undressing ?Lower body dressing ? ? ?   ?What is the patient wearing?: Pants ? ?  ? ?Lower body assist Assist for lower body dressing: Moderate Assistance - Patient 50 - 74% ?   ? ?Toileting ?Toileting    ?Toileting assist Assist for toileting: Moderate Assistance - Patient 50 - 74% ?  ?  ?Transfers ?Chair/bed transfer ? ?Transfers assist ?   ? ?Chair/bed transfer assist level: Moderate Assistance - Patient 50 - 74% ?  ?  ?Locomotion ?Ambulation ? ? ?Ambulation assist ? ?   ? ?Assist level: Moderate Assistance - Patient 50 - 74% ?Assistive device:  No Device ?Max distance: 34 feet  ? ?Walk 10 feet activity ? ? ?Assist ?   ? ?Assist level: Moderate Assistance - Patient - 50 - 74% ?Assistive device: No Device  ? ?Walk 50 feet activity ? ? ?Assist Walk 50 feet with 2 turns activity did not occur: Safety/medical concerns ? ?  ?   ? ? ?Walk 150 feet activity ? ? ?Assist Walk 150 feet activity did not occur: Safety/medical concerns ? ?  ?  ?  ? ?Walk 10 feet on uneven surface  ?activity ? ? ?Assist Walk 10 feet on uneven surfaces activity did not occur: Safety/medical concerns ? ? ?  ?   ? ?Wheelchair ? ? ? ? ?Assist Is the patient using a wheelchair?: Yes ?Type of Wheelchair: Manual ?  ? ?Wheelchair assist level: Minimal Assistance - Patient > 75% ?Max wheelchair distance: 15 ft  ? ? ?Wheelchair 50 feet with 2 turns activity ? ? ? ?Assist ? ?  ?   ? ? ?Assist Level: Total Assistance - Patient < 25%  ? ?Wheelchair 150 feet activity  ? ? ? ?Assist ?   ? ? ?Assist Level: Total Assistance - Patient < 25%  ? ?Blood pressure (!) 143/87, pulse (!) 58, temperature 98.4 ?F (36.9 ?C), temperature source Oral, resp. rate 18, height 6\' 3"  (1.905 m), weight 127.3 kg, SpO2 100 %. ? ?Medical Problem List and Plan: ?1. Functional deficits secondary to left basal ganglia ICH ?            -patient may shower ?            -ELOS/Goals: 10-14 days, supervision with PT,OT and sup/min with SLP ? -Con't CIR- PT, OT and SLP  ?2.  Antithrombotics: ?-DVT/anticoagulation:  Pharmaceutical: Lovenox ?            -antiplatelet therapy: N/A due to bleed ?3. Pain Management: tylenol prn.  ?4. Mood: LCSW to follow for evaluation and support when appropriate.  ?            -antipsychotic agents: N?A ?5. Neuropsych: This patient is not fully capable of making decisions on his own behalf. ?6. Skin/Wound Care: Routine pressure relief measures.  ?7. Fluids/Electrolytes/Nutrition: Monitor I/O. Check CMET on Monday ?8.  HTN: Monitor BP TID- continue Catapres, Norvasc, Coreg, hydralazine and HCTZ, and spironolactone\ ? -SBP elevated at times but generally improved ? -continue to follow closely ?            --monitor lytes as well with diuretics on board.  ?-check Labs Monday ?3/27- Last BMP 3/25-was ordered- not done-  ordered for tomorrow-  ?9. Severe dysphagia: Continue NPO with tube feeds for nutritional support. ?            -provide bedside suction for now--pt uses it on his own ? 3/27- Allowed PT to see pt off Tfs; but need to be given otherwise.  ?10. Acute on chronic renal failure: SCr 1.4 at baseline?-->1.78 3/25 ?            --continue to monitor with serial checks.  ?            --added water flushes.  ? -may need renal f/u as outpt ? 3/27- will f/u on CMP tomorrow to see if still dry ?11. Polycythemia: Hgb 19.4, JAK2 genetic testing still pending.  ? 3/27- Hb 17.0- con't to monitor ?12.  Urinary retention: continue foley ?            -consider voiding  trial beginning of week depending upon how well he moves.  ?  ?13. 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 Flash pulmonary edema/acute respiratory failure: Weaned off BIPAP by 03/22 ?            --Monitor daily wts  down from 141-->137 kg. ?15. Cardiac amyloidosis?: Cardiac MRI pending ? ? D/c'd IV- was older-  ? ? ?LOS: ?2 days ?A FACE TO FACE EVALUATION WAS PERFORMED ? ?Ellenora Talton ?11/15/2021, 2:46 PM  ? ?  ?

## 2021-11-15 NOTE — Progress Notes (Addendum)
Initial Nutrition Assessment ? ?DOCUMENTATION CODES:  ? ?Not applicable ? ?INTERVENTION:  ?Tube Feeds via Cortrak: ?Osmolite 1.5 @ 65 mL/hr (1560 mL/day) ?90 mL ProSource TF - TID ?Provides 2580 kcal, 163 gm of protein, and 1188 mL free water daily ?Free water flushes of 200 ml q4h. Total free water with flushes = 2388 ml ? ? ?NUTRITION DIAGNOSIS:  ? ?Increased nutrient needs related to acute illness (nontraumatic acute hemorrhage of basal ganglia) as evidenced by estimated needs. ? ? ?GOAL:  ? ?Patient will meet greater than or equal to 90% of their needs ? ? ?MONITOR:  ? ?Diet advancement, Labs, TF tolerance, Weight trends ? ?REASON FOR ASSESSMENT:  ? ?Consult ?Assessment of nutrition requirement/status ? ?ASSESSMENT:  ? ?Patient is a 36 year old male admitted for left basal ganglia hemorrhage and pulmonary edema after presenting with aphasia. Past medical history includes poorly controlled HTN, TBI, hematoma with VP shunt. ? ?3/20 - Cortrak placed, tip in stomach  ?3/25 - admitted to CIR due to functional deficits due to stroke  ?3/26 - SLP evaluation recommending NPO  ? ?Pt is currently receiving Osmolite 1.5 at goal rate of 60 ml/hr and Prosource x 3 to meet daily nutritional needs.  ? ?Met with pt at bedside.Pt unable to speak at this time due to severe anarthria. Pt able to nod head and give a thumbs up to answer questions. Per pt, pt denies any nausea or vomiting. Pt reports no abdominal pain at this time and tolerating tube feedings well. Observed tube feeding running at 60 ml/hr with 200 ml free water flushes QID.  ? ?Spoke with RN regarding pt and per RN, pt tolerating tube feedings well but reports that pt has been coughing up a lot of secretions. Per chart review, MD noted that pt has difficulty coughing up his own secretions.  ? ?Current weight: 127.3 kg ?Admit weight: 129.1 kg ?Per weight history, weight trending down. Pt did not meet criteria for malnutrition after performing NFPE, however, pt is at  nutritional risk.  ? ?Labs reviewed and include: CBG's: 72-143 x 24 hours ? ?Medications reviewed and include:  ? feeding supplement (PROSource TF)  45 mL Per Tube TID  ? free water  200 mL Per Tube QID  ? insulin aspart  0-15 Units Subcutaneous Q4H  ? pantoprazole sodium  40 mg Per Tube Daily  ? polyethylene glycol  17 g Per Tube Daily  ? sennosides  10 mL Per Tube QPC supper  ? spironolactone  50 mg Per Tube Daily  ? ?Continuous Infusions: ? feeding supplement (OSMOLITE 1.5 CAL) 1,000 mL (11/14/21 2128)  ? ? ?NUTRITION - FOCUSED PHYSICAL EXAM: ? ?Flowsheet Row Most Recent Value  ?Orbital Region No depletion  ?Upper Arm Region Mild depletion  ?Thoracic and Lumbar Region No depletion  ?Buccal Region No depletion  ?Temple Region No depletion  ?Clavicle Bone Region No depletion  ?Clavicle and Acromion Bone Region No depletion  ?Scapular Bone Region No depletion  ?Dorsal Hand No depletion  ?Patellar Region No depletion  ?Anterior Thigh Region No depletion  ?Posterior Calf Region No depletion  ?Edema (RD Assessment) None  ?Hair Reviewed  ?Eyes Reviewed  ?Mouth Reviewed  ?Skin Reviewed  ?Nails Reviewed  ? ?  ? ? ?Diet Order:   ?Diet Order   ? ?       ?  Diet NPO time specified  Diet effective midnight       ?  ? ?  ?  ? ?  ? ? ?  EDUCATION NEEDS:  ? ?Not appropriate for education at this time ? ?Skin:  Skin Assessment: Reviewed RN Assessment ? ?Last BM:  3/26 - type 5 ? ?Height:  ? ?Ht Readings from Last 1 Encounters:  ?11/13/21 '6\' 3"'  (1.905 m)  ? ? ?Weight:  ? ?Wt Readings from Last 1 Encounters:  ?11/15/21 127.3 kg  ? ? ?Ideal Body Weight:  89.1 kg ? ?BMI:  Body mass index is 35.08 kg/m?. ? ?Estimated Nutritional Needs:  ? ?Kcal:  2400 - 2600 ? ?Protein:  150 - 170 gm ? ?Fluid:  >/= 2.4 L ? ? ? ?Maryruth Hancock, Dietetic Intern ?11/15/2021 3:31 PM ?

## 2021-11-15 NOTE — Progress Notes (Signed)
Physical Therapy Session Note ? ?Patient Details  ?Name: Paul [name] ?MRN: [id number] ?Date of Birth: 04/29/1986 ? ?Today's Date: 11/15/2021 ?PT Individual Time: 4917-9150 ?PT Individual Time Calculation (min): 49 min  ? ?Short Term Goals: ?Week 1:  PT Short Term Goal 1 (Week 1): Patient will perform basic transfers with CGA consistently. ?PT Short Term Goal 2 (Week 1): Patient will ambulate >100 feet using LRAD with min A. ?PT Short Term Goal 3 (Week 1): Patient will improve Berg Balance Scale by at least 7 points to meet MCID for improved postural control and reduced fall risk. ? ?Skilled Therapeutic Interventions/Progress Updates:  ? Pt presents in bed and agreeable to therapy. CGA to come to EOB due to coming out on R side and required assist for trunk from propped elbow to sitting. Initially had difficulty with sit <> stand requiring heavy mod assist and LOB posteriorly requiring return to seated EOB. Second attempt light mod assist to facilitate weightshift anteriorly and then performed min assist stand pivot to the w/c to the L. Trialled RW with R hand orthosis for better grip and support of RUE with focus on cues for sequencing and safe management (sit to stand first, then placing RUE on orthosis and for reverse when returning to seated position). NMR during therapeutic rest breaks while PT set up hand orthosis for pt to use RUE for fine motor coordination to manipulate soft lego blocks and/or to use as stablizer when stacking. NMR during gait training with RW and R hand orthosis x 30' with turn with min assist overall including tactile and verbal cues for upright posture, placement of RW during turning and min assist during turn due R lateral lean tendency. During sit < > stand practices pt with episode of LOB to the R requiring mod assist and 1 episode of uncontrolled descent for stand to sit back into the w/c. End of session performed stand step transfer with overall min assist without AD and returned to  supine with CGA for RLE management. Wife present at bedside. ? ?Therapy Documentation ?Precautions:  ?Precautions ?Precautions: Fall ?Precaution Comments: right hemi, mild right neglect, cortrak, urinary catheter, BP<160 ?Restrictions ?Weight Bearing Restrictions: No ? ?Pain: ?Denies pain. ? ? ?Therapy/Group: Individual Therapy ? ?Tedd Sias ?Philip Aspen, PT, DPT, CBIS ? ?11/15/2021, 2:49 PM  ?

## 2021-11-16 ENCOUNTER — Inpatient Hospital Stay (HOSPITAL_COMMUNITY): Payer: Medicaid Other

## 2021-11-16 LAB — COMPREHENSIVE METABOLIC PANEL
ALT: 53 U/L — ABNORMAL HIGH (ref 0–44)
AST: 33 U/L (ref 15–41)
Albumin: 3.1 g/dL — ABNORMAL LOW (ref 3.5–5.0)
Alkaline Phosphatase: 59 U/L (ref 38–126)
Anion gap: 10 (ref 5–15)
BUN: 44 mg/dL — ABNORMAL HIGH (ref 6–20)
CO2: 26 mmol/L (ref 22–32)
Calcium: 9.2 mg/dL (ref 8.9–10.3)
Chloride: 102 mmol/L (ref 98–111)
Creatinine, Ser: 1.47 mg/dL — ABNORMAL HIGH (ref 0.61–1.24)
GFR, Estimated: >60 mL/min (ref >60)
Glucose, Bld: 122 mg/dL — ABNORMAL HIGH (ref 70–99)
Potassium: 3.7 mmol/L (ref 3.5–5.1)
Sodium: 138 mmol/L (ref 135–145)
Total Bilirubin: 0.8 mg/dL (ref 0.3–1.2)
Total Protein: 7.3 g/dL (ref 6.5–8.1)

## 2021-11-16 LAB — JAK2 EXONS 12-15

## 2021-11-16 LAB — GLUCOSE, CAPILLARY
Glucose-Capillary: 138 mg/dL — ABNORMAL HIGH (ref 70–99)
Glucose-Capillary: 164 mg/dL — ABNORMAL HIGH (ref 70–99)
Glucose-Capillary: 137 mg/dL — ABNORMAL HIGH (ref 70–99)
Glucose-Capillary: 117 mg/dL — ABNORMAL HIGH (ref 70–99)
Glucose-Capillary: 113 mg/dL — ABNORMAL HIGH (ref 70–99)
Glucose-Capillary: 123 mg/dL — ABNORMAL HIGH (ref 70–99)

## 2021-11-16 IMAGING — CR DG SKULL 1-3V
2 series · 2 of 2 positions shown · non-contrast
Comparison: No prior skull radiograph, correlation is made with
[DATE] CTA head neck

CLINICAL DATA: Ventricular shunt placement

EXAM:
SKULL - 1-3 VIEW

[skull towns]
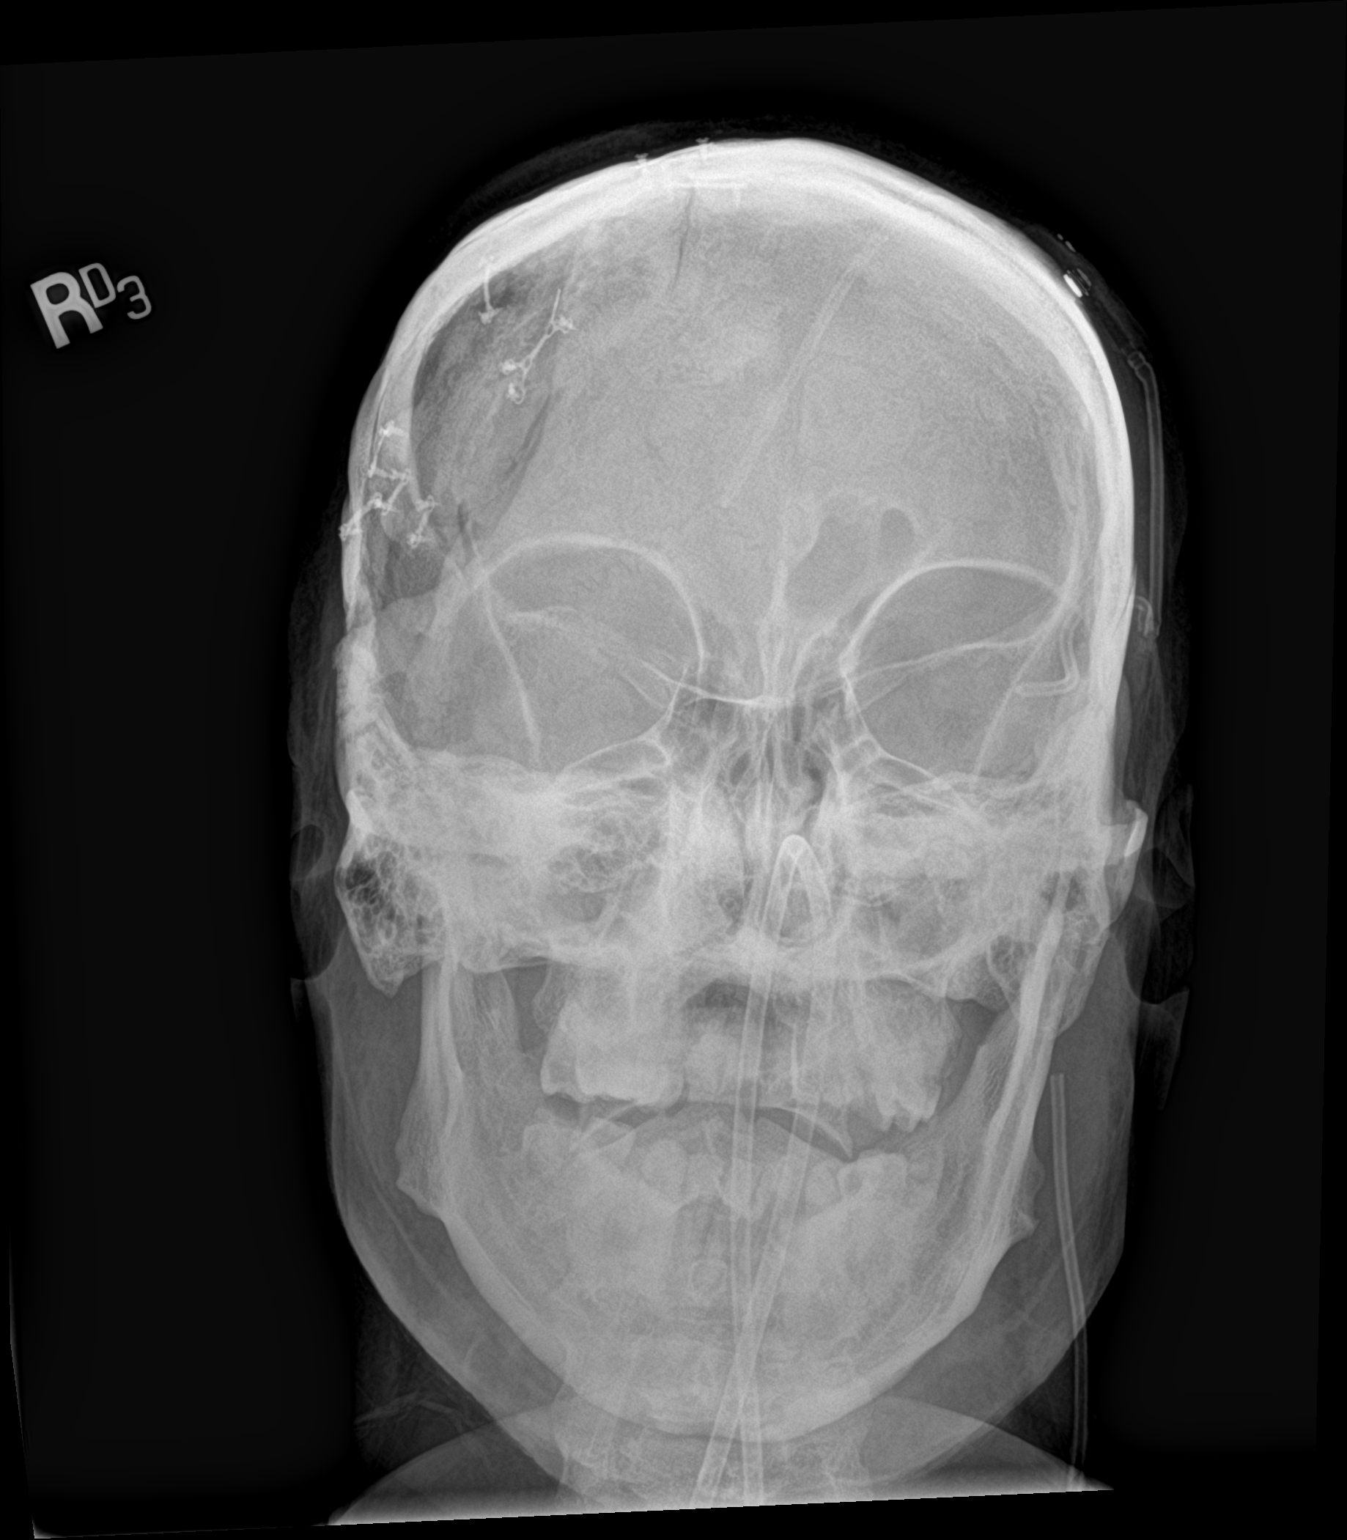

[skull lat]
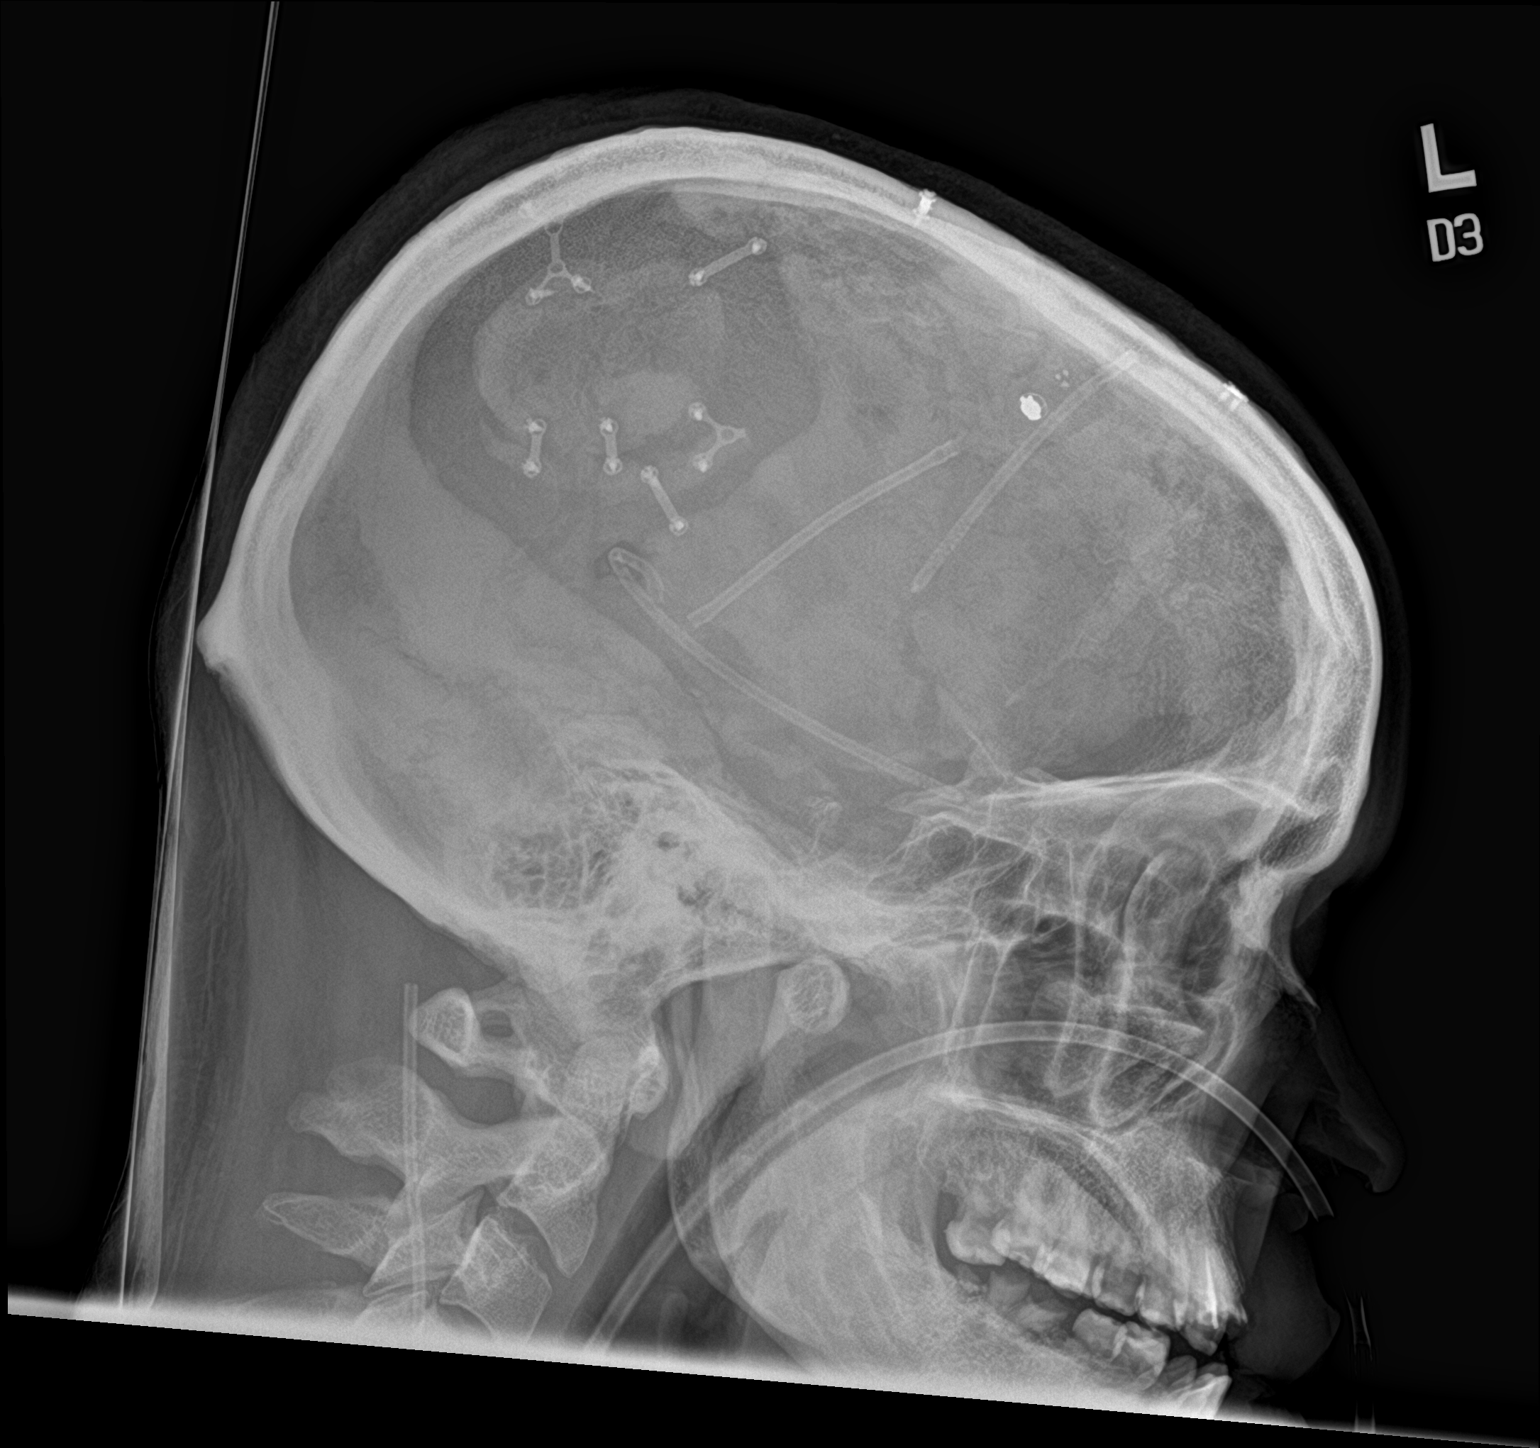

[2 of 2 positions shown; findings below may reference images not displayed]

FINDINGS: Prior right cranioplasties. Left frontal approach ventriculostomy
catheter. Additional shunt catheter enters through a left inferior
parietal burr hole. Shunt catheter tubing is seen more inferiorly,
in the left neck, but no radiopaque tubing appears to connect the
intracranial shunt catheters with the neck tubing.

A nasogastric tube is noted. Hypoplastic right frontal sinus. The
remaining sinuses are patent.
IMPRESSION: Right frontal and left inferior parietal approach ventriculostomy
catheters, without radiopaque tubing connecting these to the shunt
tubing in the left neck. Correlate with expected catheter tubing
locations.

## 2021-11-16 MED ORDER — LIDOCAINE HCL URETHRAL/MUCOSAL 2 % EX GEL: CUTANEOUS | Status: DC | PRN

## 2021-11-16 NOTE — Progress Notes (Signed)
Contacted MRI for clarification per nurse request. Per MRI--skull films need to be completed for evaluation and still pending. Once completed--radiologist to evaluate for clearance on MRI.  ?

## 2021-11-16 NOTE — Progress Notes (Signed)
Physical Therapy Session Note ? ?Patient Details  ?Name: Paul [name] ?MRN: [id number] ?Date of Birth: 1985-09-04 ? ?Today's Date: 11/16/2021 ?PT Individual Time: 7616-0737 ?PT Individual Time Calculation (min): 62 min  ? ?Short Term Goals: ?Week 1:  PT Short Term Goal 1 (Week 1): Patient will perform basic transfers with CGA consistently. ?PT Short Term Goal 2 (Week 1): Patient will ambulate >100 feet using LRAD with min A. ?PT Short Term Goal 3 (Week 1): Patient will improve Berg Balance Scale by at least 7 points to meet MCID for improved postural control and reduced fall risk. ? ?Skilled Therapeutic Interventions/Progress Updates:  ?  Pt received supine in bed resting, and agreeable to therapy session. Nurse notified and present to disconnect tube feedings for session - notified at end of session to reconnect. Pt continues to remain non-verbal throughout session using either head nods, thumbs up/down, or texting on his phone for communication.  ? ?Supine>sitting R EOB, HOB elevated but not using bedrail, with close supervision and increased time/effort to bring trunk upright. ? ?L stand pivot from slightly elevated EOB>w/c, no AD, with min assist for balance - requires increased time to come into standing. ? ?Of note: pt with intermittent bouts of heavy, labored breathing that last ~10-20seconds at inconsistent moments throughout session (does not seem to be correlated with activity, often happens at rest in sitting) -  SpO2 96-98% and HR 63bpm - pt confirms that this is new since the ICH.  ? ? Transported to/from gym in w/c for time management and energy conservation. ? ?Sit>stand w/c>RW with R hand orthosis with light min assist - cuing and hand-over-hand facilitation to push up with R hand from armrest - cuing for increased anterior trunk lean/weight shift.  ? ?Gait training ~60ft using RW with heavy min assist for balance and AD management - slight R knee giving way/sagging during stance and upon questioning  pt confirms this is partially due to R foot pain while walking - decreased R step length and foot clearance with poor L weight shift during stance. ? ?Sit>stand w/c>B UE support on litegait with light min assist - donned litegait harness total assist while providing CGA for balance. ? ?Gait training 188ft x2 in litegait harness for safety but not providing BWS nor true balance support - therapist primarily assisting with litegait management but occasionally providing facilitation for L weight shift onto stance limb to allow adequate R swing advancement (decreased facilitation required during 2nd walk), cuing for increased R step length and R foot clearance.  ? ?Doffed harness as described above. ? ?MD in/out for morning assessment - made aware of R foot pain and pt's breathing patterns. ? ?Transported back to room and pt left seated in w/c with needs in reach and seat belt alarm on.  ? ? ?Therapy Documentation ?Precautions:  ?Precautions ?Precautions: Fall ?Precaution Comments: right hemi, mild right neglect, cortrak, urinary catheter, BP<160 ?Restrictions ?Weight Bearing Restrictions: No ? ? ?Pain: ? Pt reports R foot pain - nurse made aware for medication administration - MD made aware - therapist requested pt's family bring in better tennis shoes for improved foot support - provided seated rest breaks for pain management. ? ? ? ?Therapy/Group: Individual Therapy ? ?Ginny Forth , PT, DPT, NCS, CSRS ? ?11/16/2021, 7:51 AM  ?

## 2021-11-16 NOTE — Progress Notes (Signed)
Speech Language Pathology Daily Session Note ? ?Patient Details  ?Name: Paul [name] ?MRN: [id number] ?Date of Birth: [birth year] ? ?Today's Date: 11/16/2021 ?SLP Individual Time: 1300-1400 ?SLP Individual Time Calculation (min): 60 min ? ?Short Term Goals: ?Week 1: SLP Short Term Goal 1 (Week 1): Patient will consume ice chips/thin liquid trials with min overt s/sx of aspiration to indicate readiness for MBS. ?SLP Short Term Goal 2 (Week 1): Patient will produce phonemes (/a/, /m/) with appropriate voicing in 80% of opportunities given max verbal/visual cues. ?SLP Short Term Goal 3 (Week 1): Patient will demonstrate use of diaphragmatic breathing with minA. ?SLP Short Term Goal 4 (Week 1): Pt will complete effortful swallows during ice chip/water trials to increase pharyngeal strength with minA multimodal cues. ? ?Skilled Therapeutic Interventions: Skilled ST treatment focused on communication and swallow goals. Patient communicated via multimodal means to express wants/needs consisting of head nods, gestures, and text on phone. SLP facilitated session by providing oral care via the suction toothbrush. Pt's upper denture was in 2 pieces on bedside table. Pt reported that it unfortunately broke last night. Pieces were placed into denture cup. Patient performed oral care with min A for thoroughness due to restricted mouth opening. SLP facilitated the following oral motor movements with min A and extra time: open/close mouth x10 and lingual protrusion x10. SLP utilized oral sponge to facilitate lingual protrusion and verbal cue "touch your tongue to the sponge". Patient elicited /a/ x10, /m/ x10 with min A verbal cues and extra time, and /p/ x10 with mod A demonstration and verbal cue "pretend you're spitting something out of your mouth". Patient consumed ice chips with minimal oral manipulation and tilted head upward to facilitate AP transit. He also consumed tsp of thin water (~1-2cc) with min A verbal cues to  attempt to create seal on spoon with lips. Pt's swallow appeared significantly delayed with all trials and with intermittent weak throat clear. He exhibited explosive cough x1 with tsp sip with weak cough. Recommend patient remain NPO. Patient was left in bed with alarm activated and immediate needs within reach at end of session. Continue per current plan of care.   ? ? ?Pain ?Pain Assessment ?Pain Score: 0-No pain ? ?Therapy/Group: Individual Therapy ? ?Necha Harries T Tonjua Rossetti ?11/16/2021, 1:59 PM ?

## 2021-11-16 NOTE — Progress Notes (Signed)
?                                                       PROGRESS NOTE ? ? ?Subjective/Complaints: ? ?Amb with lite gait , PT assist , ACE wrap to Right ankle  ?Intermittent rapid breathing episodes that last < 10s, denies pain, or difficulty breathing, no cough but does have Feeding tube  ? ?Had  ? ?ROS: limited due to cognition/ aphasia ?Objective: ?  ?No results found. ?Recent Labs  ?  11/15/21 ?0700  ?WBC 8.5  ?HGB 17.7*  ?HCT 54.7*  ?PLT 228  ? ? ?No results for input(s): NA, K, CL, CO2, GLUCOSE, BUN, CREATININE, CALCIUM in the last 72 hours. ? ? ?Intake/Output Summary (Last 24 hours) at 11/16/2021 0836 ?Last data filed at 11/16/2021 0700 ?Gross per 24 hour  ?Intake 2468 ml  ?Output 1425 ml  ?Net 1043 ml  ? ?  ? ?  ? ?Physical Exam: ?Vital Signs ?Blood pressure (!) 141/81, pulse (!) 57, temperature 98 ?F (36.7 ?C), temperature source Axillary, resp. rate 16, height _0  (1.905 m), weight 127.3 kg, SpO2 97 %. ? ? ? ?General: No acute distress ?Mood and affect are appropriate ?Heart: Regular rate and rhythm no rubs murmurs or extra sounds ?Lungs: Clear to auscultation, breathing unlabored, no rales or wheezes ?Abdomen: Positive bowel sounds, soft nontender to palpation, nondistended ?Extremities: No clubbing, cyanosis, or edema ?Skin: No evidence of breakdown, no evidence of rash ? ? ?Skew deviation RIght eye  ?Musculoskeletal: Full range of motion in all 4 extremities. No joint swelling ?dysarthric,dysphonic. Has word finding deficits but comprehension appears reasonable.  RUE 2+ prox to 1/5 distally. RLE 3-4/5. LUE and LLE 5/5. Senses pain on right ?Uro: foley ?Musculoskeletal: pain with compression of Right met heads, no joint deformity or swelling  ? ? ?Assessment/Plan: ?1. Functional deficits which require 3+ hours per day of interdisciplinary therapy in a comprehensive inpatient rehab setting. ?Physiatrist is providing close team supervision and 24 hour management of active medical problems listed  below. ?Physiatrist and rehab team continue to assess barriers to discharge/monitor patient progress toward functional and medical goals ? ?Care Tool: ? ?Bathing ? Bathing activity did not occur: Safety/medical concerns ?Body parts bathed by patient: Right arm, Chest, Abdomen, Front perineal area, Buttocks  ? Body parts bathed by helper: Left arm ?  ?  ?Bathing assist Assist Level: Moderate Assistance - Patient 50 - 74% ?  ?  ?Upper Body Dressing/Undressing ?Upper body dressing   ?What is the patient wearing?: Pull over shirt ?   ?Upper body assist Assist Level: Moderate Assistance - Patient 50 - 74% ?   ?Lower Body Dressing/Undressing ?Lower body dressing ? ? ?   ?What is the patient wearing?: Pants ? ?  ? ?Lower body assist Assist for lower body dressing: Moderate Assistance - Patient 50 - 74% ?   ? ?Toileting ?Toileting    ?Toileting assist Assist for toileting: Maximal Assistance - Patient 25 - 49% ?  ?  ?Transfers ?Chair/bed transfer ? ?Transfers assist ? Chair/bed transfer activity did not occur: Safety/medical concerns ? ?Chair/bed transfer assist level: Minimal Assistance - Patient > 75% ?  ?  ?Locomotion ?Ambulation ? ? ?Ambulation assist ? ?   ? ?Assist level: Minimal Assistance - Patient > 75% ?Assistive device: Walker-rolling ?Max distance:  30'  ? ?Walk 10 feet activity ? ? ?Assist ?   ? ?Assist level: Minimal Assistance - Patient > 75% ?Assistive device: Walker-rolling, Orthosis  ? ?Walk 50 feet activity ? ? ?Assist Walk 50 feet with 2 turns activity did not occur: Safety/medical concerns ? ?  ?   ? ? ?Walk 150 feet activity ? ? ?Assist Walk 150 feet activity did not occur: Safety/medical concerns ? ?  ?  ?  ? ?Walk 10 feet on uneven surface  ?activity ? ? ?Assist Walk 10 feet on uneven surfaces activity did not occur: Safety/medical concerns ? ? ?  ?   ? ?Wheelchair ? ? ? ? ?Assist Is the patient using a wheelchair?: Yes ?Type of Wheelchair: Manual ?  ? ?Wheelchair assist level: Minimal Assistance -  Patient > 75% ?Max wheelchair distance: 15 ft  ? ? ?Wheelchair 50 feet with 2 turns activity ? ? ? ?Assist ? ?  ?  ? ? ?Assist Level: Total Assistance - Patient < 25%  ? ?Wheelchair 150 feet activity  ? ? ? ?Assist ?   ? ? ?Assist Level: Total Assistance - Patient < 25%  ? ?Blood pressure (!) 141/81, pulse (!) 57, temperature 98 ?F (36.7 ?C), temperature source Axillary, resp. rate 16, height _0  (1.905 m), weight 127.3 kg, SpO2 97 %. ? ?Medical Problem List and Plan: ?1. Functional deficits secondary to left basal ganglia ICH ?            -patient may shower ?            -ELOS/Goals: 10-14 days, supervision with PT,OT and sup/min with SLP ? -Con't CIR- PT, OT and SLP  ?2.  Antithrombotics: ?-DVT/anticoagulation:  Pharmaceutical: Lovenox ?            -antiplatelet therapy: N/A due to bleed ?3. Pain Management: tylenol prn.  ?4. Mood: LCSW to follow for evaluation and support when appropriate.  ?            -antipsychotic agents: N?A ?5. Neuropsych: This patient is not fully capable of making decisions on his own behalf. ?6. Skin/Wound Care: Routine pressure relief measures.  ?7. Fluids/Electrolytes/Nutrition: Monitor I/O. Check CMET on Monday ?8.  HTN: Monitor BP TID- continue Catapres, Norvasc, Coreg, hydralazine and HCTZ, and spironolactone\ ? -SBP elevated at times but generally improved ? -continue to follow closely ?            --monitor lytes as well with diuretics on board.  ?-check Labs Monday ?3/27- Last BMP 3/25-was ordered- not done-  ordered for tomorrow-  ?9. Severe dysphagia: Continue NPO with tube feeds for nutritional support. ?            -provide bedside suction for now--pt uses it on his own ? 3/27- Allowed PT to see pt off Tfs; but need to be given otherwise.  ?10. Acute on chronic renal failure: SCr 1.4 at baseline?-->1.78 3/25 ?            --continue to monitor with serial checks.  ?            --added water flushes.  ? -may need renal f/u as outpt ? 3/27- will f/u on CMP tomorrow to see if  still dry ?11. Polycythemia: Hgb 19.4, JAK2 genetic testing still pending.  ? 3/27- Hb 17.0- con't to monitor ?12. Urinary retention: continue foley ?            -consider voiding trial beginning of week depending upon how  well he moves.  ?  ?13. 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 Flash pulmonary edema/acute respiratory failure: Weaned off BIPAP by 03/22 ?            --Monitor daily wts  down from 141-->137 kg. ?15. Cardiac amyloidosis?: Cardiac MRI pending- unable to do MRI unless cleared by Neurosurgery - had shunt placed at [facility] in 2003 by Dr Darryl Nestle , after TBI with hydrocephalus, ? Type of shunt will see if pt has been following up with Neurosurgery so we can get clearance  ? ?Has not had an MRI since pre shunt at least in [facility]  ? ?16.  Metatarsalgia RIght foot will add voltaren gel and metatarsal pad  ?LOS: ?3 days ?A FACE TO FACE EVALUATION WAS PERFORMED ? ?Luanna Salk Jarmaine Ehrler ?11/16/2021, 8:36 AM  ? ?  ?

## 2021-11-16 NOTE — Progress Notes (Signed)
Orthopedic Tech Progress Note ?Patient Details:  ?Paul [name] ?September 17, 1985 ?[id number] ? ?Called in order to [facility] for a "FOOT ORTHOSIS' for metatarsalgia   ? ?Patient ID: Paul [name], male   DOB: 1986/04/09, 36 y.o.   MRN: [id number] ? ?[name] ?11/16/2021, 9:27 AM ? ?

## 2021-11-16 NOTE — Progress Notes (Signed)
Occupational Therapy Session Note ? ?Patient Details  ?Name: Paul [name] ?MRN: [id number] ?Date of Birth: [birth year] ? ?Today's Date: 11/16/2021 ?OT Individual Time: 1135-1200 ?OT Individual Time Calculation (min): 25 min  ? ? ?Short Term Goals: ?Week 1:  OT Short Term Goal 1 (Week 1): Pt will complete UB dressing with supervision ?OT Short Term Goal 2 (Week 1): Pt will complete toileting with min assist ?OT Short Term Goal 3 (Week 1): Pt will complete LB dressing with min assist. ?OT Short Term Goal 4 (Week 1): Pt will demonstrate functional use of RUE as stabilizer with mod cueing to attend. ? ?Skilled Therapeutic Interventions/Progress Updates:  ?Pt greeted  supine in bed  agreeable to OT intervention. Session focus on BADL reeducation, functional mobility, dynamic standing balance and decreasing overall caregiver burden. Pt completed supine>sit with supervision. Pt completed stand pivot to w/c with Rw and MINA. Pt completed bathing at sink with MODA for bathing in standing, pt needed assist from OTA to wash buttock. Pt completed UB dressing with MODA as pt wanted to wear gown. Pt completed stand pivot back to bed with rw and MINA.  pt left supine in bed with bed alarm activated and all needs within reach.                   ? ?Therapy Documentation ?Precautions:  ?Precautions ?Precautions: Fall ?Precaution Comments: right hemi, mild right neglect, cortrak, urinary catheter, BP<160 ?Restrictions ?Weight Bearing Restrictions: No ? ?Pain: no pain reported during session ? ? ? ?Therapy/Group: Individual Therapy ? ?[name] ?11/16/2021, 12:18 PM ?

## 2021-11-16 NOTE — IPOC Note (Addendum)
Overall Plan of Care (IPOC) ?Patient Details ?Name: Paul [name] ?MRN: [id number] ?DOB: 25-Sep-1985 ? ?Admitting Diagnosis: Nontraumatic acute hemorrhage of basal ganglia (HCC) ? ?Hospital Problems: Principal Problem: ?  Nontraumatic acute hemorrhage of basal ganglia (HCC) ? ? ? ? Functional Problem List: ?Nursing Bladder, Bowel, Endurance, Nutrition, Safety, Medication Management  ?PT Balance, Perception, Safety, Sensory, Edema, Skin Integrity, Endurance, Motor, Nutrition  ?OT Balance, Perception, Cognition, Edema, Endurance, Motor, Nutrition  ?SLP Motor  ?TR    ?    ? Basic ADL?s: ?OT Grooming, Bathing, Dressing, Toileting, Eating  ? ?  Advanced  ADL?s: ?OT Simple Meal Preparation  ?   ?Transfers: ?PT Bed to Chair, Bed Mobility, Car, Furniture  ?OT Toilet, Tub/Shower  ? ?  Locomotion: ?PT Ambulation, Wheelchair Mobility, Stairs  ? ?  Additional Impairments: ?OT Fuctional Use of Upper Extremity  ?SLP Swallowing, Communication ?expression ?   ?TR    ? ? ?Anticipated Outcomes ?Item Anticipated Outcome  ?Self Feeding setup  ?Swallowing ? minA ?  ?Basic self-care ? CGA- supervision  ?Toileting ? supervision ?  ?Bathroom Transfers CGA  ?Bowel/Bladder ? manage bladder w total assist and bowel w mod I assist  ?Transfers ? mod I using LRAD  ?Locomotion ? Supervision >200 feet using LRAD  ?Communication ? minA  ?Cognition ? N/A  ?Pain ? N/A  ?Safety/Judgment ? Maintain safety w cues  ? ?Therapy Plan: ?PT Intensity: Minimum of 1-2 x/day ,45 to 90 minutes ?PT Frequency: 5 out of 7 days ?PT Duration Estimated Length of Stay: 2 weeks ?OT Intensity: Minimum of 1-2 x/day, 45 to 90 minutes ?OT Frequency: 5 out of 7 days ?OT Duration/Estimated Length of Stay: 2.5-3 weeks ?SLP Intensity: Minumum of 1-2 x/day, 30 to 90 minutes ?SLP Frequency: 3 to 5 out of 7 days ?SLP Duration/Estimated Length of Stay: 10-14 days  ? ?Due to the current state of emergency, patients may not be receiving their 3-hours of Medicare-mandated therapy. ? ?  Team Interventions: ?Nursing Interventions Patient/Family Education, Bladder Management, Bowel Management, Medication Management, Disease Management/Prevention, Discharge Planning, Dysphagia/Aspiration Precaution Training  ?PT interventions Ambulation/gait training, Cognitive remediation/compensation, Discharge planning, DME/adaptive equipment instruction, Pain management, Functional mobility training, Psychosocial support, Splinting/orthotics, Therapeutic Activities, UE/LE Strength taining/ROM, Visual/perceptual remediation/compensation, Warden/ranger, Community reintegration, Disease management/prevention, Functional electrical stimulation, Neuromuscular re-education, Patient/family education, Skin care/wound management, Stair training, Therapeutic Exercise, UE/LE Coordination activities, Wheelchair propulsion/positioning  ?OT Interventions Balance/vestibular training, Discharge planning, Pain management, Self Care/advanced ADL retraining, Therapeutic Activities, UE/LE Coordination activities, Functional electrical stimulation, Cognitive remediation/compensation, Disease mangement/prevention, Functional mobility training, Patient/family education, Skin care/wound managment, Therapeutic Exercise, Visual/perceptual remediation/compensation, Community reintegration, DME/adaptive equipment instruction, Neuromuscular re-education, Psychosocial support, Splinting/orthotics, UE/LE Strength taining/ROM, Wheelchair propulsion/positioning  ?SLP Interventions Environmental controls, Multimodal communication approach, Speech/Language facilitation, Cueing hierarchy, Functional tasks, Therapeutic Activities, Internal/external aids, Oral motor exercises, Therapeutic Exercise, Dysphagia/aspiration precaution training, Patient/family education  ?TR Interventions    ?SW/CM Interventions Discharge Planning, Psychosocial Support, Patient/Family Education, Disease Management/Prevention  ? ?Barriers to Discharge ?MD   Medical stability and Nutritional means  ?Nursing Lack of/limited family support, Home environment access/layout, Medication compliance, Neurogenic Bowel & Bladder, Nutrition means ?1 level solo PTA. Mother wMS to assist w family at discharge  ?PT Decreased caregiver support, Home environment access/layout, Lack of/limited family support ?   ?OT Home environment access/layout, Decreased caregiver support, Neurogenic Bowel & Bladder, Weight, Nutrition means ?Pt was in between homes when admitted; he reports he prefers to live on his own and mom only available a few days a  week; need to further assess social supports and home layout of where he will be dicharging to.  ?SLP Lack of Family Support ?   ?SW Lack of/limited family support, Insurance for SNF coverage, Decreased caregiver support ?   ? ?Team Discharge Planning: ?Destination: PT-Home ,OT- Home , SLP-Home ?Projected Follow-up: PT-Outpatient PT, OT-  Outpatient OT, SLP-Outpatient SLP ?Projected Equipment Needs: PT-To be determined, OT- To be determined, SLP-None recommended by SLP ?Equipment Details: PT- , OT-  ?Patient/family involved in discharge planning: PT- Patient,  OT-Patient, SLP-Patient ? ?MD ELOS: 10-14d ?Medical Rehab Prognosis:  Good ?Assessment: The patient has been admitted for CIR therapies with the diagnosis of Left BG ICH. The team will be addressing functional mobility, strength, stamina, balance, safety, adaptive techniques and equipment, self-care, bowel and bladder mgt, patient and caregiver education, Swallow retraining, bladder retraining. Goals have been set at [employer]. Anticipated discharge destination is Home. ? ?Due to the current state of emergency, patients may not be receiving their 3 hours per day of Medicare-mandated therapy ?  ? ? ?See Team Conference Notes for weekly updates to the plan of care  ?

## 2021-11-16 NOTE — Progress Notes (Signed)
Occupational Therapy Session Note ? ?Patient Details  ?Name: Paul [name] ?MRN: [id number] ?Date of Birth: May 30, 1986 ? ?Today's Date: 11/16/2021 ?OT Individual Time: [id number] ?OT Individual Time Calculation (min): 29 min  ? ? ?Short Term Goals: ?Week 1:  OT Short Term Goal 1 (Week 1): Pt will complete UB dressing with supervision ?OT Short Term Goal 2 (Week 1): Pt will complete toileting with min assist ?OT Short Term Goal 3 (Week 1): Pt will complete LB dressing with min assist. ?OT Short Term Goal 4 (Week 1): Pt will demonstrate functional use of RUE as stabilizer with mod cueing to attend. ? ?Skilled Therapeutic Interventions/Progress Updates:  ?Pt received asleep in supine, easily able to arouse and nods "yes", agreeable to OT intervention. Session focus on RUE coordination. Pt session conducted from semi reclined in bed with pt instructed to reach forward for clothespins hanging in front of pt on tray table. Pt needed support at R elbow to facilitate scapular protraction/retraction and hand over hand assist to initially grasp pins but able to complete task with overall MOD A. Pt also encouraged to continue working on functional grasp with compliant cube and passing cube from R <>L hand. Pt left semireclined in bed with all needs within reach and bed alarm activated.           ? ? ?Therapy Documentation ?Precautions:  ?Precautions ?Precautions: Fall ?Precaution Comments: right hemi, mild right neglect, cortrak, urinary catheter, BP<160 ?Restrictions ?Weight Bearing Restrictions: No ? ?Pain: ?No pain reported during session  ? ?Therapy/Group: Individual Therapy ? ?[name] ?11/16/2021, 3:55 PM ?

## 2021-11-16 NOTE — Progress Notes (Signed)
Physical Therapy Session Note ? ?Patient Details  ?Name: Paul [name] ?MRN: [id number] ?Date of Birth: Nov 07, 1985 ? ?Today's Date: 11/16/2021 ?PT Individual Time: 5465-6812 ?PT Individual Time Calculation (min): 29 min  ? ?Short Term Goals: ?Week 1:  PT Short Term Goal 1 (Week 1): Patient will perform basic transfers with CGA consistently. ?PT Short Term Goal 2 (Week 1): Patient will ambulate >100 feet using LRAD with min A. ?PT Short Term Goal 3 (Week 1): Patient will improve Berg Balance Scale by at least 7 points to meet MCID for improved postural control and reduced fall risk. ? ?Skilled Therapeutic Interventions/Progress Updates:  ?  Patient received sitting up in wc, agreeable to PT. He denies pain. Patient using thumbs up/down and phone to communicate with PT throughout session. He ambulated 64ft with RW and MinA, requiring MInA to stand. Wc follow for safety. Progressive R drifting and limited ability to manage walker with R UE. Patient responsive to verbal cues for increased B foot clearance- otherwise patient with B shuffle. Upon returning to his room, patient requesting to use bathroom. Stedy used to transfer to toilet due to urgency. MinA to stand into Lucas. TotalA for clothing management in standing. Handoff to NT to continue care.  ? ?Therapy Documentation ?Precautions:  ?Precautions ?Precautions: Fall ?Precaution Comments: right hemi, mild right neglect, cortrak, urinary catheter, BP<160 ?Restrictions ?Weight Bearing Restrictions: No ? ? ? ? ?Therapy/Group: Individual Therapy ? ?Westley Foots ?Westley Foots, PT, DPT, CBIS ? ?11/16/2021, 7:37 AM  ?

## 2021-11-17 ENCOUNTER — Inpatient Hospital Stay (HOSPITAL_COMMUNITY): Payer: Medicaid Other

## 2021-11-17 ENCOUNTER — Encounter (HOSPITAL_COMMUNITY): Payer: Self-pay | Admitting: Physical Medicine & Rehabilitation

## 2021-11-17 DIAGNOSIS — I3139 Other pericardial effusion (noninflammatory): Secondary | ICD-10-CM

## 2021-11-17 LAB — GLUCOSE, CAPILLARY
Glucose-Capillary: 116 mg/dL — ABNORMAL HIGH (ref 70–99)
Glucose-Capillary: 114 mg/dL — ABNORMAL HIGH (ref 70–99)
Glucose-Capillary: 128 mg/dL — ABNORMAL HIGH (ref 70–99)
Glucose-Capillary: 128 mg/dL — ABNORMAL HIGH (ref 70–99)
Glucose-Capillary: 114 mg/dL — ABNORMAL HIGH (ref 70–99)

## 2021-11-17 IMAGING — MR MR HEAD W/O CM
12 of 13 series · 44 of 48 positions shown · non-contrast
Comparison: Head and neck CTA [DATE]

CLINICAL DATA: Stroke follow-up.

EXAM:
MRI HEAD WITHOUT CONTRAST
TECHNIQUE: Multiplanar, multiecho pulse sequences of the brain and surrounding
structures were obtained without intravenous contrast.

[Series 5: DWI · axial · 3.0mm · 0.88mm/px · z∈[-91,+62]mm · 9 of 104 slices shown (1 of 4)]
[im 1/104]
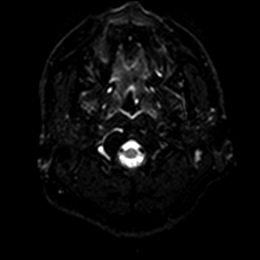
[im 13/104]
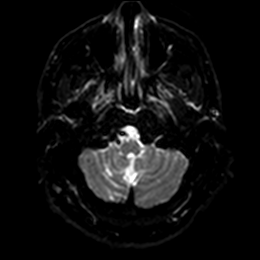
[im 26/104]
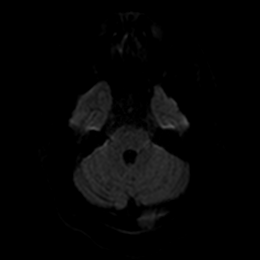
[im 39/104]
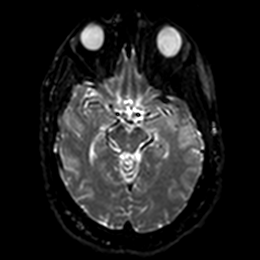
[im 52/104]
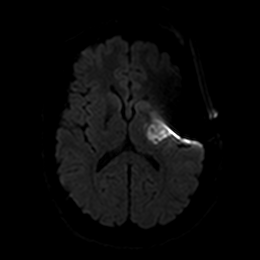
[im 65/104]
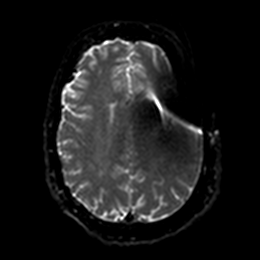
[im 78/104]
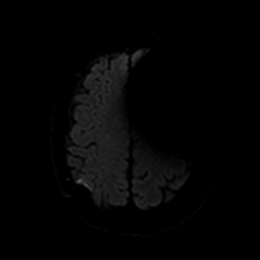
[im 91/104]
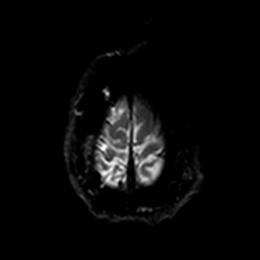
[im 104/104]
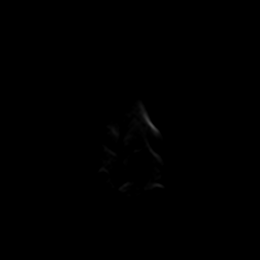

[Series 6: DWI · axial · 3.0mm · 0.88mm/px · z∈[-91,+62]mm · 4 of 52 slices shown (2 of 4)]
[im 1/52]
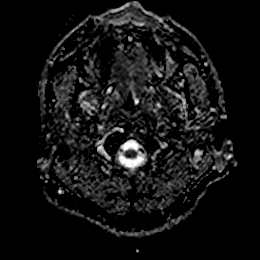
[im 18/52]
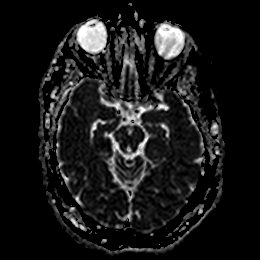
[im 35/52]
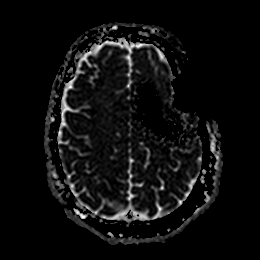
[im 52/52]
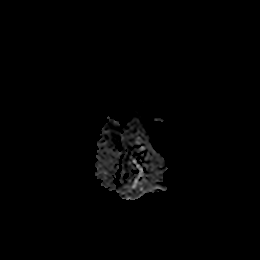

[Series 7: DWI · coronal · 4.0mm · 0.88mm/px · 5 of 68 slices shown (3 of 4)]
[im 1/68]
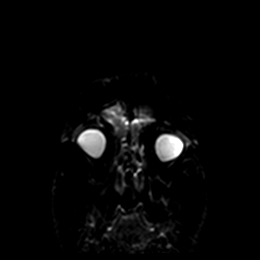
[im 17/68]
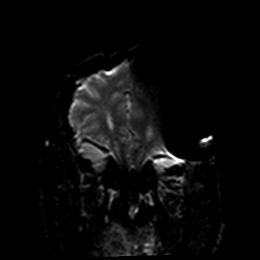
[im 34/68]
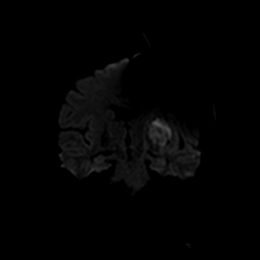
[im 51/68]
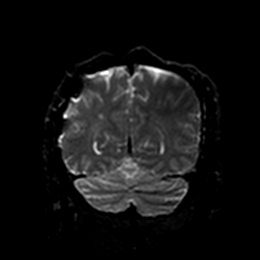
[im 68/68]
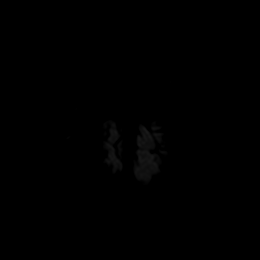

[Series 8: DWI · coronal · 4.0mm · 0.88mm/px · 2 of 33 slices shown (4 of 4)]
[im 1/33]
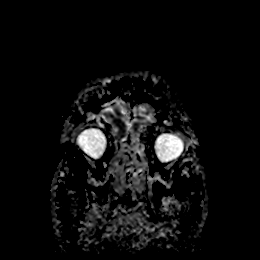
[im 33/33]
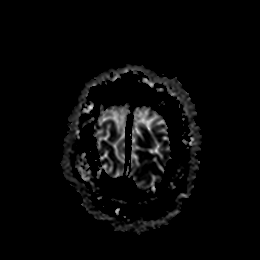

[Series 9: T1 · sagittal · 5.0mm · 0.75mm/px · 2 of 23 slices shown]
[im 1/23]
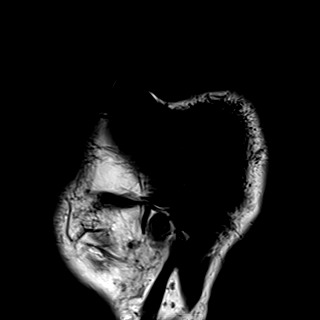
[im 23/23]
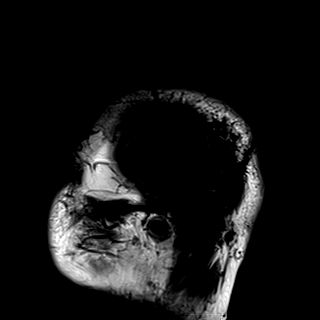

[Series 10: T2 · axial · 5.0mm · 0.72mm/px · z∈[-109,+65]mm · 2 of 30 slices shown (1 of 2)]
[im 1/30]
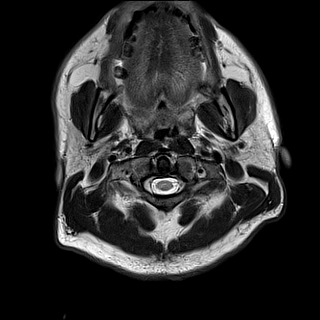
[im 30/30]
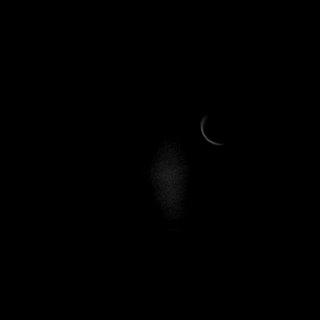

[Series 11: FLAIR · axial · 5.0mm · 0.45mm/px · z∈[-110,+64]mm · 2 of 30 slices shown]
[im 1/30]
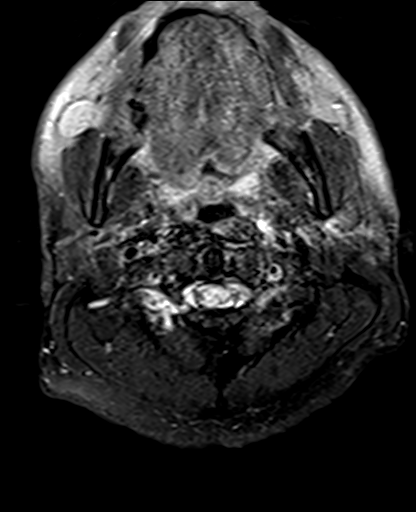
[im 30/30]
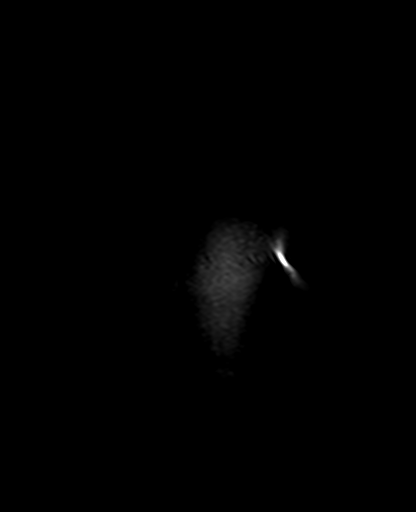

[Series 12: mag_images · axial · 3.0mm · 0.90mm/px · z∈[-100,+77]mm · 4 of 60 slices shown]
[im 1/60]
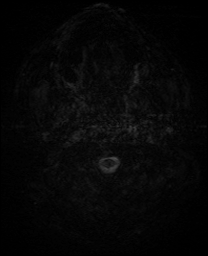
[im 20/60]
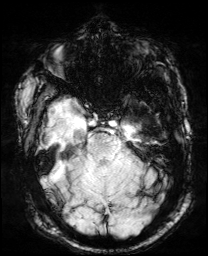
[im 40/60]
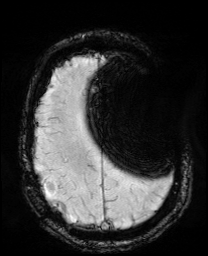
[im 60/60]
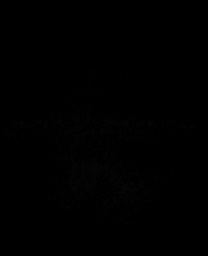

[Series 13: pha_images · axial · 3.0mm · 0.90mm/px · z∈[-100,+71]mm · 4 of 58 slices shown]
[im 1/58]
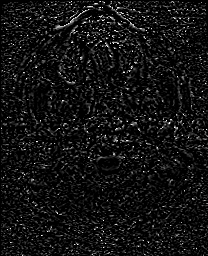
[im 20/58]
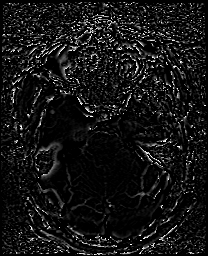
[im 39/58]
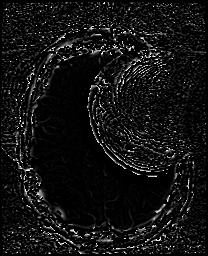
[im 58/58]
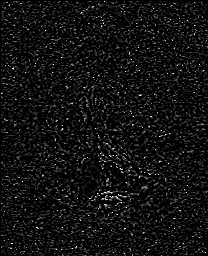

[Series 14: swi_images · axial · 3.0mm · 0.90mm/px · z∈[-100,+77]mm · 4 of 60 slices shown]
[im 1/60]
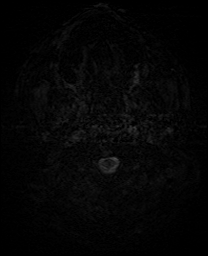
[im 20/60]
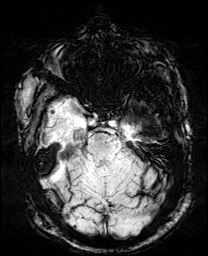
[im 40/60]
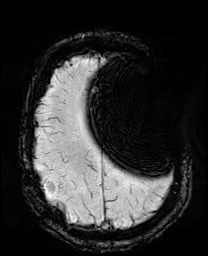
[im 60/60]
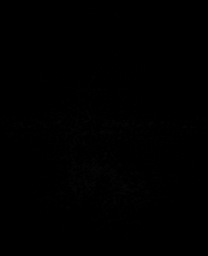

[Series 15: mip_images(sw) · axial · 24.0mm · 0.90mm/px · z∈[-90,+66]mm · 4 of 53 slices shown]
[im 1/53]
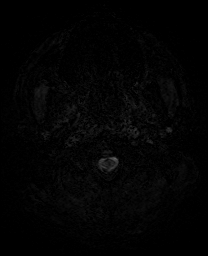
[im 18/53]
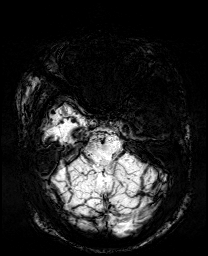
[im 35/53]
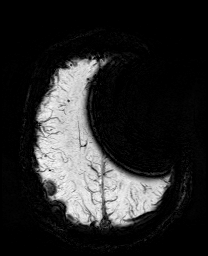
[im 53/53]
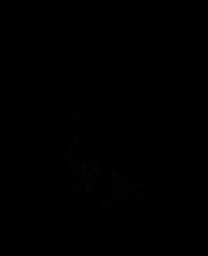

[Series 17: T2 · coronal · 5.0mm · 0.34mm/px · 2 of 29 slices shown (2 of 2)]
[im 1/29]
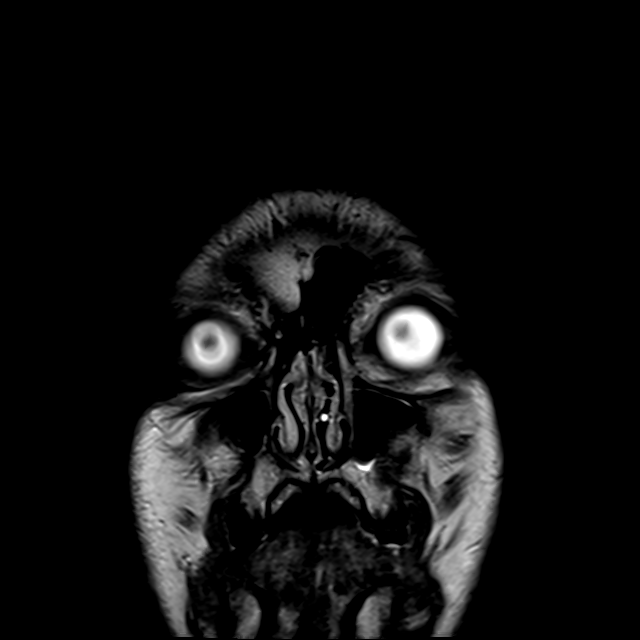
[im 29/29]
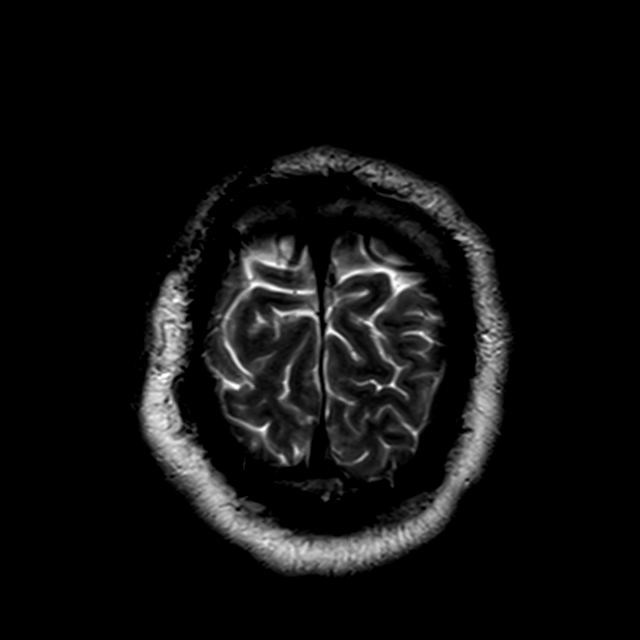

[44 of 48 positions shown; findings below may reference images not displayed]

FINDINGS: Brain: A 2.2 x 1.9 x 2.5 cm hemorrhage centered in the left putamen
with mild surrounding edema is unchanged. A left frontal approach
ventriculostomy catheter is again noted terminating in the frontal
horn of the right lateral ventricle, and there is gliosis along the
catheter in the left frontal lobe. There is prominent susceptibility
artifact from the external portion of the shunt which obscures
portions of the left frontal lobe, particularly on diffusion
weighted and susceptibility weighted imaging limiting assessment for
acute infarct. Within this limitation, no sizable acute infarct is
identified separate from the left basal ganglia hemorrhage. An
additional catheter is again noted coursing over the lateral left
cerebral convexity in terminating in the left middle cranial fossa.

There are numerous chronic microhemorrhages in the cerebrum
(particularly right frontotemporal region) and cerebellum. The
ventricles are normal in size. Chronic infarcts are noted in the
inferior right cerebellar hemisphere and left medulla. Chronic
lacunar infarcts versus dilated perivascular spaces are noted in the
basal ganglia bilaterally. There is no extra-axial fluid collection.

Vascular: Major intracranial vascular flow voids are preserved.

Skull and upper cervical spine: Bilateral craniotomies.

Sinuses/Orbits: Unremarkable orbits. Mild mucosal thickening in the
paranasal sinuses. Small left mastoid effusion.

Other: None.
IMPRESSION: 1. Unchanged 2.5 cm left basal ganglia hemorrhage with mild
surrounding edema.
2. Chronic microhemorrhages in the cerebrum and cerebellum which may
be related to the patient's history of hypertension and traumatic
brain injury.
3. Chronic right cerebellar and left medullary infarcts.

## 2021-11-17 IMAGING — MR MR CARD MORPHOLOGY WO/W CM
45 of 48 series · 45 of 48 positions shown · IV contrast (Contrast agent)
Comparison: none

CLINICAL DATA: Evaluate for amyloid

EXAM:
CARDIAC MRI
TECHNIQUE: The patient was scanned on a 1.5 Tesla Siemens magnet. A dedicated
cardiac coil was used. Functional imaging was done using Fiesta
sequences. [DATE], and 4 chamber views were done to assess for RWMA's.
Modified RISTAPUTRAWB rule using a short axis stack was used to
calculate an ejection fraction on a dedicated work station using
Circle software. The patient received 10 cc of Gadavist. After 10
minutes inversion recovery sequences were used to assess for
infiltration and scar tissue.
CONTRAST:  10 cc  of Gadavist

[Series 4: t2_haste_db_tra_bh · axial · 8.0mm · 1.41mm/px · 1 of 16 slices shown]
[im 1/16]
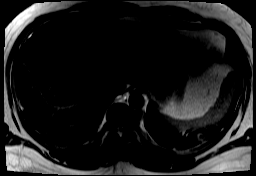

[Series 8: bSSFP · oblique · 8.0mm · 1.61mm/px · 1 of 25 slices shown (1 of 23)]
[im 1/25]
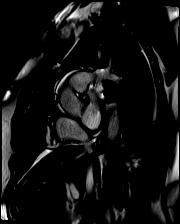

[Series 9: bSSFP · oblique · 8.0mm · 1.61mm/px · 1 of 25 slices shown (2 of 23)]
[im 1/25]
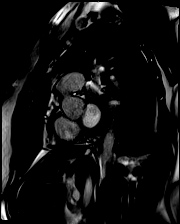

[Series 10: bSSFP · oblique · 8.0mm · 1.61mm/px · 1 of 25 slices shown (3 of 23)]
[im 1/25]
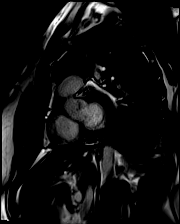

[Series 11: bSSFP · oblique · 8.0mm · 1.61mm/px · 1 of 25 slices shown (4 of 23)]
[im 1/25]
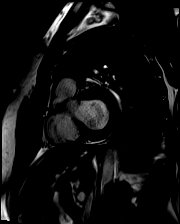

[Series 12: bSSFP · oblique · 8.0mm · 1.61mm/px · 1 of 25 slices shown (5 of 23)]
[im 1/25]
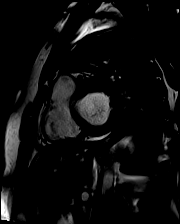

[Series 13: bSSFP · oblique · 8.0mm · 1.61mm/px · 1 of 25 slices shown (6 of 23)]
[im 1/25]
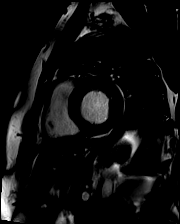

[Series 14: bSSFP · oblique · 8.0mm · 1.61mm/px · 1 of 25 slices shown (7 of 23)]
[im 1/25]
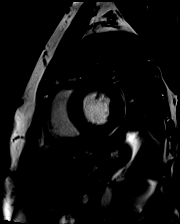

[Series 15: bSSFP · oblique · 8.0mm · 1.61mm/px · 1 of 25 slices shown (8 of 23)]
[im 1/25]
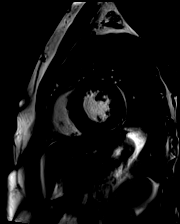

[Series 16: bSSFP · oblique · 8.0mm · 1.61mm/px · 1 of 25 slices shown (9 of 23)]
[im 1/25]
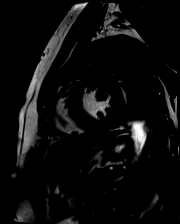

[Series 17: bSSFP · oblique · 8.0mm · 1.61mm/px · 1 of 25 slices shown (10 of 23)]
[im 1/25]
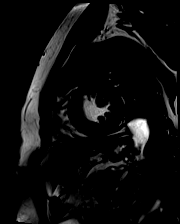

[Series 18: bSSFP · oblique · 8.0mm · 1.61mm/px · 1 of 25 slices shown (11 of 23)]
[im 1/25]
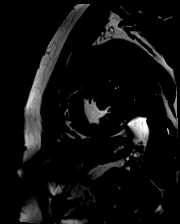

[Series 19: bSSFP · oblique · 8.0mm · 1.61mm/px · 1 of 25 slices shown (12 of 23)]
[im 1/25]
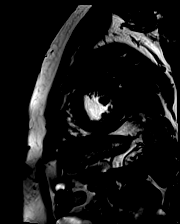

[Series 20: bSSFP · oblique · 8.0mm · 1.61mm/px · 1 of 25 slices shown (13 of 23)]
[im 1/25]
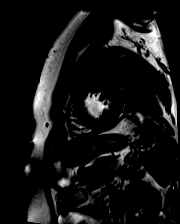

[Series 21: bSSFP · oblique · 8.0mm · 1.61mm/px · 1 of 25 slices shown (14 of 23)]
[im 1/25]
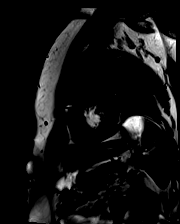

[Series 22: bSSFP · oblique · 8.0mm · 1.61mm/px · 1 of 25 slices shown (15 of 23)]
[im 1/25]
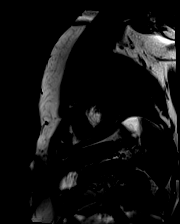

[Series 23: bSSFP · oblique · 8.0mm · 1.61mm/px · 1 of 25 slices shown (16 of 23)]
[im 1/25]
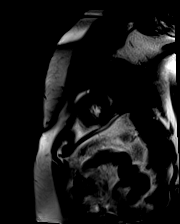

[Series 24: bSSFP · oblique · 8.0mm · 1.61mm/px · 1 of 25 slices shown (17 of 23)]
[im 1/25]
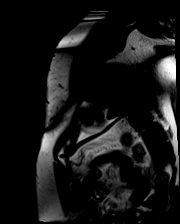

[Series 25: bSSFP · oblique · 8.0mm · 1.61mm/px · 1 of 25 slices shown (18 of 23)]
[im 1/25]
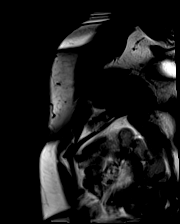

[Series 26: bSSFP · oblique · 8.0mm · 1.61mm/px · 1 of 25 slices shown (19 of 23)]
[im 1/25]
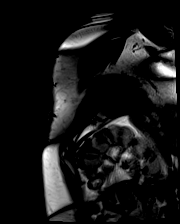

[Series 27: bSSFP · oblique · 8.0mm · 1.61mm/px · 1 of 25 slices shown (20 of 23)]
[im 1/25]
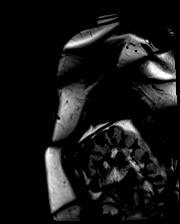

[Series 28: bSSFP · oblique · 6.0mm · 1.56mm/px · 1 of 25 slices shown (21 of 23)]
[im 1/25]
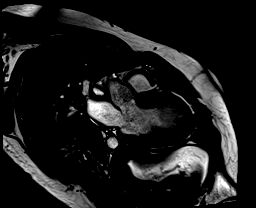

[Series 29: bSSFP · oblique · 6.0mm · 1.41mm/px · 1 of 25 slices shown (22 of 23)]
[im 1/25]
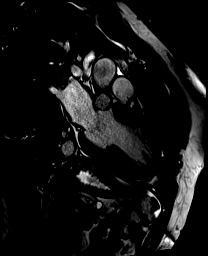

[Series 31: cine_trufi_short axis_cs_2_shot · sagittal · 8.0mm · 1.48mm/px · 1 of 25 slices shown (1 of 20)]
[im 1/25]
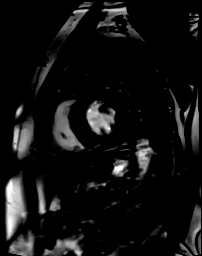

[Series 31: cine_trufi_short axis_cs_2_shot · sagittal · 8.0mm · 1.48mm/px · 1 of 25 slices shown (2 of 20)]
[im 1/25]
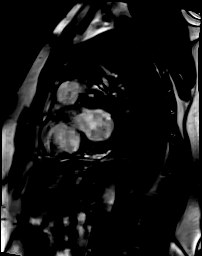

[Series 31: cine_trufi_short axis_cs_2_shot · sagittal · 8.0mm · 1.48mm/px · 1 of 25 slices shown (3 of 20)]
[im 1/25]
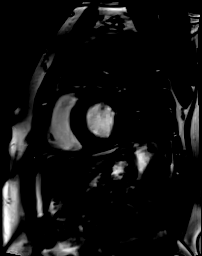

[Series 31: cine_trufi_short axis_cs_2_shot · sagittal · 8.0mm · 1.48mm/px · 1 of 25 slices shown (4 of 20)]
[im 1/25]
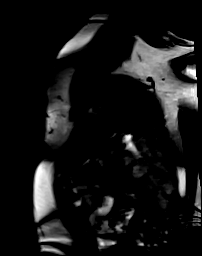

[Series 31: cine_trufi_short axis_cs_2_shot · sagittal · 8.0mm · 1.48mm/px · 1 of 25 slices shown (5 of 20)]
[im 1/25]
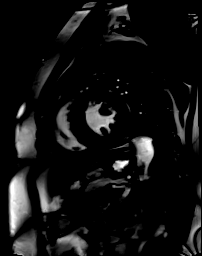

[Series 31: cine_trufi_short axis_cs_2_shot · sagittal · 8.0mm · 1.48mm/px · 1 of 25 slices shown (6 of 20)]
[im 1/25]
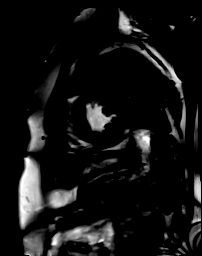

[Series 31: cine_trufi_short axis_cs_2_shot · sagittal · 8.0mm · 1.48mm/px · 1 of 25 slices shown (7 of 20)]
[im 1/25]
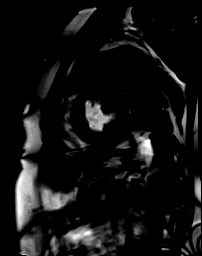

[Series 31: cine_trufi_short axis_cs_2_shot · sagittal · 8.0mm · 1.48mm/px · 1 of 25 slices shown (8 of 20)]
[im 1/25]
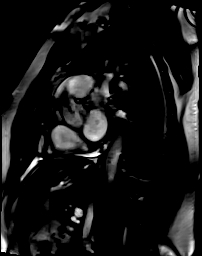

[Series 31: cine_trufi_short axis_cs_2_shot · sagittal · 8.0mm · 1.48mm/px · 1 of 25 slices shown (9 of 20)]
[im 1/25]
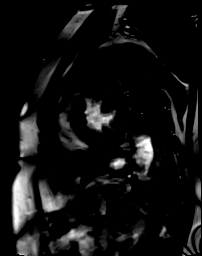

[Series 31: cine_trufi_short axis_cs_2_shot · sagittal · 8.0mm · 1.48mm/px · 1 of 25 slices shown (10 of 20)]
[im 1/25]
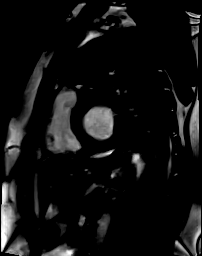

[Series 31: cine_trufi_short axis_cs_2_shot · sagittal · 8.0mm · 1.48mm/px · 1 of 25 slices shown (11 of 20)]
[im 1/25]
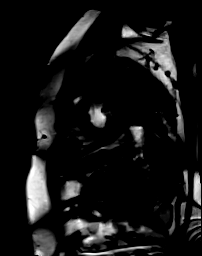

[Series 31: cine_trufi_short axis_cs_2_shot · sagittal · 8.0mm · 1.48mm/px · 1 of 25 slices shown (12 of 20)]
[im 1/25]
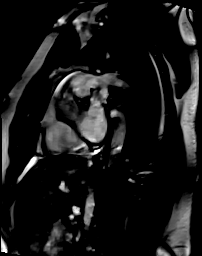

[Series 31: cine_trufi_short axis_cs_2_shot · sagittal · 8.0mm · 1.48mm/px · 1 of 25 slices shown (13 of 20)]
[im 1/25]
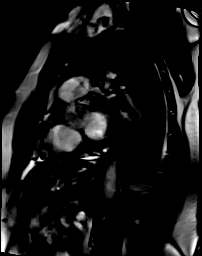

[Series 31: cine_trufi_short axis_cs_2_shot · sagittal · 8.0mm · 1.48mm/px · 1 of 25 slices shown (14 of 20)]
[im 1/25]
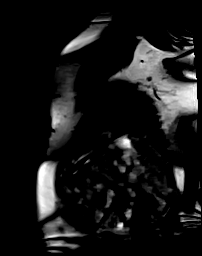

[Series 31: cine_trufi_short axis_cs_2_shot · sagittal · 8.0mm · 1.48mm/px · 1 of 25 slices shown (15 of 20)]
[im 1/25]
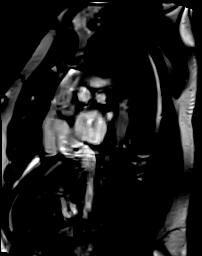

[Series 31: cine_trufi_short axis_cs_2_shot · sagittal · 8.0mm · 1.48mm/px · 1 of 25 slices shown (16 of 20)]
[im 1/25]
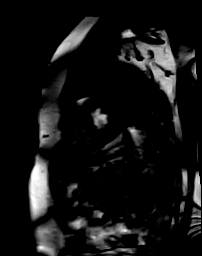

[Series 31: cine_trufi_short axis_cs_2_shot · sagittal · 8.0mm · 1.48mm/px · 1 of 25 slices shown (17 of 20)]
[im 1/25]
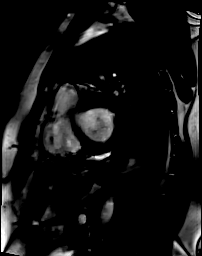

[Series 31: cine_trufi_short axis_cs_2_shot · sagittal · 8.0mm · 1.48mm/px · 1 of 25 slices shown (18 of 20)]
[im 1/25]
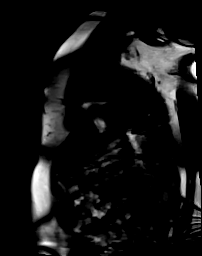

[Series 31: cine_trufi_short axis_cs_2_shot · sagittal · 8.0mm · 1.48mm/px · 1 of 25 slices shown (19 of 20)]
[im 1/25]
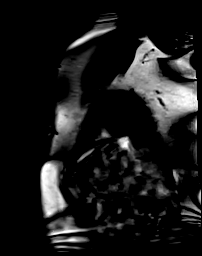

[Series 31: cine_trufi_short axis_cs_2_shot · sagittal · 8.0mm · 1.48mm/px · 1 of 25 slices shown (20 of 20)]
[im 1/25]
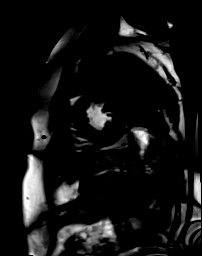

[Series 33: bSSFP · oblique · 6.0mm · 1.56mm/px · 1 of 25 slices shown (23 of 23)]
[im 1/25]
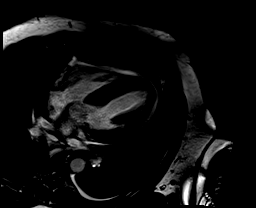

[Series 34: (id)_trufi · oblique · 8.0mm · 2.08mm/px · 1 of 9 slices shown]
[im 1/9]
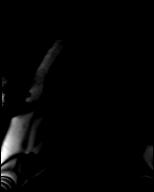

[45 of 48 positions shown; findings below may reference images not displayed]

FINDINGS: Left ventricle:

-Asymmetric hypertrophy measuring 21mm in septum (14mm in posterior
wall)

-Normal size

-Normal systolic function

-Normal ECV (27%)

-Patchy LGE at RV insertion site and basal septum. LGE accounts for
1% of total myocardial mass

LV EF: 53% (Normal 56-78%)

Absolute volumes:

LV EDV: 227mL (Normal 77-195 mL)

LV ESV: 105mL (Normal 19-72 mL)

LV SV: 121mL (Normal 51-133 mL)

CO: 7.9L/min (Normal 2.8-8.8 L/min)

Indexed volumes:

LV EDV: 87mL/sq-m (Normal 47-92 mL/sq-m)

LV ESV: 41mL/sq-m (Normal 13-30 mL/sq-m)

LV SV: 47mL/sq-m (Normal 32-62 mL/sq-m)

CI: 3.1L/min/sq-m (Normal 1.7-4.2 L/min/sq-m)

Right ventricle: Normal size and systolic function

RV EF:  54% (Normal 47-74%)

Absolute volumes:

RV EDV: 212mL (Normal 88-227 mL)

RV ESV: 97mL (Normal 23-103 mL)

RV SV: 115mL (Normal 52-138 mL)

CO: 7.5L/min (Normal 2.8-8.8 L/min)

Indexed volumes:

RV EDV: 82mL/sq-m (Normal 55-105 mL/sq-m)

RV ESV: 37mL/sq-m (Normal 15-43 mL/sq-m)

RV SV: 44mL/sq-m (Normal 32-64 mL/sq-m)

CI: 2.9L/min/sq-m (Normal 1.7-4.2 L/min/sq-m)

Left atrium: Normal size

Right atrium: Mild enlargement

Mitral valve: No regurgitation

Aortic valve: Mild regurgitation. Bicuspid aortic valve with fusion
of left and right cusps

Tricuspid valve: No regurgitation

Pulmonic valve: No regurgitation

Aorta: Normal proximal ascending aorta

Pulmonary artery: Dilated main PA measuring 30mm

Pericardium: Small effusion
IMPRESSION: 1.  No evidence of cardiac amyloidosis

2. Asymmetric LV hypertrophy measuring 21mm in septum (14mm in
posterior wall), consistent with hypertrophic cardiomyopathy

3. Patchy late gadolinium enhancement at RV insertion site and basal
septum, consistent with HCM. LGE accounts for 1% of total myocardial
mass

4.  Bicuspid aortic valve with fusion of left and right cusps

5.  Normal LV size and systolic function (EF 53%)

6.  Normal RV size and systolic function (EF 54%)

7.  Small pericardial effusion

## 2021-11-17 MED ORDER — GADOBUTROL 1 MMOL/ML IV SOLN: 10.0000 mL | Freq: Once | INTRAVENOUS | Status: DC | PRN

## 2021-11-17 MED ORDER — MODAFINIL 100 MG PO TABS: 100.0000 mg | ORAL_TABLET | Freq: Every day | ORAL | Status: DC

## 2021-11-17 MED ORDER — MODAFINIL 100 MG PO TABS
100.0000 mg | ORAL_TABLET | Freq: Every day | ORAL | Status: DC
  Administered 2021-11-18 – 2021-11-29 (×11): 100 mg via ORAL
  Filled 2021-11-17 (×11): qty 1

## 2021-11-17 MED ORDER — SENNOSIDES 8.8 MG/5ML PO SYRP
10.0000 mL | ORAL_SOLUTION | Freq: Every day | ORAL | Status: DC
  Administered 2021-11-17 – 2021-11-29 (×11): 10 mL
  Filled 2021-11-17 (×13): qty 10

## 2021-11-17 MED ORDER — GADOBUTROL 1 MMOL/ML IV SOLN
10.0000 mL | Freq: Once | INTRAVENOUS | Status: AC | PRN
  Administered 2021-11-17: 10 mL via INTRAVENOUS

## 2021-11-17 NOTE — Telephone Encounter (Addendum)
Called patient for follow up after hospital discharge, unable to reach or leave VM because voicemail box was full. Will await discharge from [facility] ?

## 2021-11-17 NOTE — Progress Notes (Signed)
Physical Therapy Session Note ? ?Patient Details  ?Name: Paul [name] ?MRN: [id number] ?Date of Birth: [birth year] ? ?Today's Date: 11/17/2021 ?PT Individual Time: 1000-1033 ?PT Individual Time Calculation (min): 33 min  ? ?Short Term Goals: ?Week 1:  PT Short Term Goal 1 (Week 1): Patient will perform basic transfers with CGA consistently. ?PT Short Term Goal 2 (Week 1): Patient will ambulate >100 feet using LRAD with min A. ?PT Short Term Goal 3 (Week 1): Patient will improve Berg Balance Scale by at least 7 points to meet MCID for improved postural control and reduced fall risk. ? ?Skilled Therapeutic Interventions/Progress Updates:  ?  Pt received supine in bed and agreeable to therapy session. Tube feedings held during mobility, but not disconnected at this time due to short session. Supine>sitting R EOB, HOB elevated but not using bedrail, with close supervision and continued increased time and effort to bring trunk upright. ? ?Sitting EOB threaded on pants mod assist - pt with poor awareness of sequencing LB clothing sequencing with cues to thread R LE first. Donned tennis shoes total assist. Sit>stand EOB>RW (R hand orthosis) with light min assist for balance - pulled pants up over hips min assist  with pt performing primarily with L UE.  ? ?Gait training ~28ft to dayroom mat using RW (+2 bringing w/c in event of fatigue) with min assist of 1 for balance and intermittently for AD management - cuing for increased L weight shift onto stance limb, increased R LE foot clearance and step length - requires max verbal cuing for sustained correction in R LE gait deviations - pt does demo increasing gait speed compared to yesterday. ? ?Donned 5lb ankle weight on RLE. ? ?Gait training ~242ft using 3 Musketeer support to promote increased upright posture and for dynamic balance challenge with +2 light mod/heavy min assist for balance -  continued cuing as noted above for improved R LE gait mechanics that become more  challenge towards end of walk with fatigue.  ? ?Doffed ankle weight. ? ?Dynamic gait training with 3 Musketeer support via lateral side stepping ~7ft each with +2 heavy mod assist for balance - continues to require cuing for adequate and sustained L weight shift onto stance limb while stepping R LE laterally.  ? ?Gait ~63ft into room using 3 Musketeer support as described above.  ? ?At end of session, pt left seated in recliner with needs in reach, seat belt alarm on, and lines intact with tube feedings resumed. ? ? ? ?Therapy Documentation ?Precautions:  ?Precautions ?Precautions: Fall ?Precaution Comments: right hemi, mild right neglect, cortrak, urinary catheter, BP<160 ?Restrictions ?Weight Bearing Restrictions: No ? ? ?Pain: ?Pt denies R foot pain today therefore continued to wear his tennis shoes as opposed to the [place] shoe. ? ? ? ?Therapy/Group: Individual Therapy ? ?Ginny Forth , PT, DPT, NCS, CSRS ?11/17/2021, 8:02 AM  ?

## 2021-11-17 NOTE — Progress Notes (Signed)
?                                                       PROGRESS NOTE ? ? ?Subjective/Complaints: ? ?MRI brain shows unchanged Left BG Hematoma ? ?Had cardiac MRI as well but no reading yet ? ?ROS: limited due to cognition/ aphasia ?Objective: ?  ?DG Skull 1-3 Views ? ?Result Date: 11/16/2021 ?CLINICAL DATA:  Ventricular shunt placement EXAM: SKULL - 1-3 VIEW COMPARISON:  No prior skull radiograph, correlation is made with 11/11/2021 CTA head neck FINDINGS: Prior right cranioplasties. Left frontal approach ventriculostomy catheter. Additional shunt catheter enters through a left inferior parietal burr hole. Shunt catheter tubing is seen more inferiorly, in the left neck, but no radiopaque tubing appears to connect the intracranial shunt catheters with the neck tubing. A nasogastric tube is noted. Hypoplastic right frontal sinus. The remaining sinuses are patent. IMPRESSION: Right frontal and left inferior parietal approach ventriculostomy catheters, without radiopaque tubing connecting these to the shunt tubing in the left neck. Correlate with expected catheter tubing locations. Electronically Signed   By: Merilyn Baba M.D.   On: 11/16/2021 16:58  ? ?MR BRAIN WO CONTRAST ? ?Result Date: 11/17/2021 ?CLINICAL DATA:  Stroke follow-up. EXAM: MRI HEAD WITHOUT CONTRAST TECHNIQUE: Multiplanar, multiecho pulse sequences of the brain and surrounding structures were obtained without intravenous contrast. COMPARISON:  Head and neck CTA 11/11/2021 FINDINGS: Brain: A 2.2 x 1.9 x 2.5 cm hemorrhage centered in the left putamen with mild surrounding edema is unchanged. A left frontal approach ventriculostomy catheter is again noted terminating in the frontal horn of the right lateral ventricle, and there is gliosis along the catheter in the left frontal lobe. There is prominent susceptibility artifact from the external portion of the shunt which obscures portions of the left frontal lobe, particularly on diffusion weighted and  susceptibility weighted imaging limiting assessment for acute infarct. Within this limitation, no sizable acute infarct is identified separate from the left basal ganglia hemorrhage. An additional catheter is again noted coursing over the lateral left cerebral convexity in terminating in the left middle cranial fossa. There are numerous chronic microhemorrhages in the cerebrum (particularly right frontotemporal region) and cerebellum. The ventricles are normal in size. Chronic infarcts are noted in the inferior right cerebellar hemisphere and left medulla. Chronic lacunar infarcts versus dilated perivascular spaces are noted in the basal ganglia bilaterally. There is no extra-axial fluid collection. Vascular: Major intracranial vascular flow voids are preserved. Skull and upper cervical spine: Bilateral craniotomies. Sinuses/Orbits: Unremarkable orbits. Mild mucosal thickening in the paranasal sinuses. Small left mastoid effusion. Other: None. IMPRESSION: 1. Unchanged 2.5 cm left basal ganglia hemorrhage with mild surrounding edema. 2. Chronic microhemorrhages in the cerebrum and cerebellum which may be related to the patient's history of hypertension and traumatic brain injury. 3. 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 Electronically Signed   By: Logan Bores M.D.   On: 11/17/2021 08:59   ?Recent Labs  ?  11/15/21 ?0700  ?WBC 8.5  ?HGB 17.7*  ?HCT 54.7*  ?PLT 228  ? ? ?Recent Labs  ?  11/16/21 ?0707  ?NA 138  ?K 3.7  ?CL 102  ?CO2 26  ?GLUCOSE 122*  ?BUN 44*  ?CREATININE 1.47*  ?CALCIUM 9.2  ? ? ? ?Intake/Output Summary (Last 24 hours) at 11/17/2021 0937 ?Last data  filed at 11/17/2021 0229 ?Gross per 24 hour  ?Intake 0 ml  ?Output 1650 ml  ?Net -1650 ml  ? ?  ? ?  ? ?Physical Exam: ?Vital Signs ?Blood pressure 134/76, pulse (!) 57, temperature 98.4 ?F (36.9 ?C), resp. rate 14, height $RemoveBe'6\' 3"'[id number]$  (1.905 m), weight 127.3 kg, SpO2 97 %. ? ? ? ?General: No acute distress ?Mood and affect are appropriate ?Heart:  Regular rate and rhythm no rubs murmurs or extra sounds ?Lungs: Clear to auscultation, breathing unlabored, no rales or wheezes ?Abdomen: Positive bowel sounds, soft nontender to palpation, nondistended ?Extremities: No clubbing, cyanosis, or edema ?Skin: No evidence of breakdown, no evidence of rash ? ? ?Skew deviation RIght eye  ?Musculoskeletal: Full range of motion in all 4 extremities. No joint swelling ?dysarthric,dysphonic. Has word finding deficits but comprehension appears reasonable.  RUE 2+ prox to 1/5 distally. RLE 3-4/5. LUE and LLE 5/5. Senses pain on right ?Uro: foley ?Musculoskeletal: pain with compression of Right met heads, no joint deformity or swelling  ? ? ?Assessment/Plan: ?1. Functional deficits which require 3+ hours per day of interdisciplinary therapy in a comprehensive inpatient rehab setting. ?Physiatrist is providing close team supervision and 24 hour management of active medical problems listed below. ?Physiatrist and rehab team continue to assess barriers to discharge/monitor patient progress toward functional and medical goals ? ?Care Tool: ? ?Bathing ? Bathing activity did not occur: Safety/medical concerns ?Body parts bathed by patient: Right arm, Chest, Abdomen, Front perineal area, Left arm, Right upper leg, Left upper leg, Right lower leg, Left lower leg, Face  ? Body parts bathed by helper: Buttocks ?  ?  ?Bathing assist Assist Level: Maximal Assistance - Patient 24 - 49% (standing) ?  ?  ?Upper Body Dressing/Undressing ?Upper body dressing   ?What is the patient wearing?: [place] only ?   ?Upper body assist Assist Level: Moderate Assistance - Patient 50 - 74% ?   ?Lower Body Dressing/Undressing ?Lower body dressing ? ? ?   ?What is the patient wearing?: Pants ? ?  ? ?Lower body assist Assist for lower body dressing: Moderate Assistance - Patient 50 - 74% ?   ? ?Toileting ?Toileting    ?Toileting assist Assist for toileting: Maximal Assistance - Patient 25 - 49% ?  ?   ?Transfers ?Chair/bed transfer ? ?Transfers assist ? Chair/bed transfer activity did not occur: Safety/medical concerns ? ?Chair/bed transfer assist level: Minimal Assistance - Patient > 75% ?  ?  ?Locomotion ?Ambulation ? ? ?Ambulation assist ? ?   ? ?Assist level: Minimal Assistance - Patient > 75% ?Assistive device: Walker-rolling (w/ R hand orthosis) ?Max distance: 66ft  ? ?Walk 10 feet activity ? ? ?Assist ?   ? ?Assist level: Minimal Assistance - Patient > 75% ?Assistive device: Walker-rolling, Orthosis  ? ?Walk 50 feet activity ? ? ?Assist Walk 50 feet with 2 turns activity did not occur: Safety/medical concerns ? ?  ?   ? ? ?Walk 150 feet activity ? ? ?Assist Walk 150 feet activity did not occur: Safety/medical concerns ? ?  ?  ?  ? ?Walk 10 feet on uneven surface  ?activity ? ? ?Assist Walk 10 feet on uneven surfaces activity did not occur: Safety/medical concerns ? ? ?  ?   ? ?Wheelchair ? ? ? ? ?Assist Is the patient using a wheelchair?: Yes ?Type of Wheelchair: Manual ?  ? ?Wheelchair assist level: Minimal Assistance - Patient > 75% ?Max wheelchair distance: 15 ft  ? ? ?Wheelchair  50 feet with 2 turns activity ? ? ? ?Assist ? ?  ?  ? ? ?Assist Level: Total Assistance - Patient < 25%  ? ?Wheelchair 150 feet activity  ? ? ? ?Assist ?   ? ? ?Assist Level: Total Assistance - Patient < 25%  ? ?Blood pressure 134/76, pulse (!) 57, temperature 98.4 ?F (36.9 ?C), resp. rate 14, height _0  (1.905 m), weight 127.3 kg, SpO2 97 %. ? ?Medical Problem List and Plan: ?1. Functional deficits secondary to left basal ganglia ICH ?            -patient may shower ?            -ELOS/Goals: 10-14 days, supervision with PT,OT and sup/min with SLP ? -Con't CIR- PT, OT and SLP  ?2.  Antithrombotics: ?-DVT/anticoagulation:  Pharmaceutical: Lovenox ?            -antiplatelet therapy: N/A due to bleed ?3. Pain Management: tylenol prn.  ?4. Mood: LCSW to follow for evaluation and support when appropriate.  ?             -antipsychotic agents: N?A ?5. Neuropsych: This patient is not fully capable of making decisions on his own behalf. ?6. Skin/Wound Care: Routine pressure relief measures.  ?7. Fluids/Electrolytes/Nutrition: Monitor I/O.

## 2021-11-17 NOTE — Progress Notes (Signed)
Physical Therapy Session Note ? ?Patient Details  ?Name: Paul [name] ?MRN: [id number] ?Date of Birth: 03-15-1986 ? ?Pt is unavailable, off the floor for MRI. Will re-attempt as schedule and pt availability permits. Pt missed 45 minutes of physical therapy. ? ?Orrin Brigham PT ?11/17/2021, 8:05 AM  ?

## 2021-11-17 NOTE — Progress Notes (Signed)
Patient ID: Paul [name], male   DOB: [birth year], 36 y.o.   MRN: [id number] ? ?Team Conference Report to Patient/Family ? ?Team Conference discussion was reviewed with the patient and caregiver, including goals, any changes in plan of care and target discharge date.  Patient and caregiver express understanding and are in agreement.  The patient has a target discharge date of 11/30/21. ? ?Sw spoke to patient mother and provided team conference updates. Family scheduled family education on April 7-8 8-12 ? ?[name] ?11/17/2021, 2:19 PM  ?

## 2021-11-17 NOTE — Patient Care Conference (Signed)
Inpatient RehabilitationTeam Conference and Plan of Care Update ?Date: 11/17/2021   Time: 10:54 AM  ? ? ?Patient Name: Paul [name]      ?Medical Record Number: [id number]  ?Date of Birth: 07/23/1986 ?Sex: Male         ?Room/Bed: 4W06C/4W06C-01 ?Payor Info: Payor: /   ? ?Admit Date/Time:  11/13/2021  3:01 PM ? ?Primary Diagnosis:  Nontraumatic acute hemorrhage of basal ganglia (HCC) ? ?Hospital Problems: Principal Problem: ?  Nontraumatic acute hemorrhage of basal ganglia (HCC) ? ? ? ?Expected Discharge Date: Expected Discharge Date: 11/30/21 ? ?Team Members Present: ?Physician leading conference: Dr. Claudette Laws ?Social Worker Present: Lavera Guise, BSW ?Nurse Present: Chana Bode, RN ?PT Present: Casimiro Needle, PT ?OT Present: [name], COTA;Stacey Kinter, OT ?SLP Present: Eilene Ghazi, SLP ?PPS Coordinator present : Edson Snowball, PT ? ?   Current Status/Progress Goal Weekly Team Focus  ?Bowel/Bladder ? ?   Continent of bowel and bladder   Continent   Foley removed 11/17/21  ?Swallow/Nutrition/ Hydration ? ? NPO  initiate oral diet  oral care, ice chips and limited thin liquid trials, oral exercises   ?ADL's ? ? MAX A for bathing from sink level, MOD A for UB dressing, MOD A for LB dressing, MIN A for ADL transfers, continues to have good R sided attention but decreased AROM in RUE during ADL particpation.  set- up CGA  BADL reeducation, functional mobility, RUE AROM, balance   ?Mobility ? ? CGA/supervision bed mobility, min-mod A sit to/from stand and stand pivot transfers w/o AD, gait up to 14ft using RW w/ R hand orthosis or up to 12ft in litegait for safety (no BWS), mod A 4 steps B rails, good attention to R hand - non-verbal with written communicaiton  mod-I/supervision at ambulatory level  activity tolerance, pt education, bed mobility retraining, transfer training, R hemibody NMR, gait training using LRAD, stair navigation training, standing tolerance and balance   ?Communication ? ?  max A  min A  oral motor movements and phoneme production   ?Safety/Cognition/ Behavioral Observations ?           ?Pain ? ?   N/A        ?Skin ? ?   N/A        ? ? ?Discharge Planning:  ?discharging home with mother, sister and children to assist. Mother unable to provide physical assistance   ?Team Discussion: ?Patient with nontraumatic hemorrage of basal ganglia with possible amyloidosis. Discussion of need for PEG for discharge given current situation, NPO using cortrak for nutrition/medications. Given anarthria; will likely need PEG for discharge. Also note right inattention, and inability to clear right foot when walking.  ? ?Patient on target to meet rehab goals: ?yes, currently needs mod assist for upper and lower body care and min assit for transfers. Completes sit - stand with min assist and able to ambulate up to 150'. Completes steps with mod assist. Requires verbal cues for safety. Goals for discharge set for CGA - Min assist overall. ? ?*See Care Plan and progress notes for long and short-term goals.  ? ?Revisions to Treatment Plan:  ?Downgraded PT goals to CGA ?Multimodal communication tools due to anarthria ?  ?Teaching Needs: ?Safety, transfers, medications, nutritional means, skin care/PEG care, etc  ?Current Barriers to Discharge: ?Decreased caregiver support and Home enviroment access/layout ? ?Possible Resolutions to Barriers: ?Family education ?  ? ? Medical Summary ?Current Status: NG tube feeds , NPO, cardiac MRI pending, voiding  trial in progress, severe oromotor  incoordination ? Barriers to Discharge: Medical stability;Medication compliance;Nutrition means;Weight ?  ?Possible Resolutions to [employer] Focus: Need to eval for PEG vs Oral feeds, trial stimulant ? ? ?Continued Need for Acute Rehabilitation Level of Care: The patient requires daily medical management by a physician with specialized training in physical medicine and rehabilitation for the following reasons: ?Direction of a  multidisciplinary physical rehabilitation program to maximize functional independence : Yes ?Medical management of patient stability for increased activity during participation in an intensive rehabilitation regime.: Yes ?Analysis of laboratory values and/or radiology reports with any subsequent need for medication adjustment and/or medical intervention. : Yes ? ? ?I attest that I was present, lead the team conference, and concur with the assessment and plan of the team. ? ? ?Chana Bode B ?11/17/2021, 3:41 PM  ? ? ? ? ? ? ?

## 2021-11-17 NOTE — Progress Notes (Signed)
Occupational Therapy Session Note ? ?Patient Details  ?Name: Paul [name] ?MRN: [id number] ?Date of Birth: 07-13-1986 ? ?Today's Date: 11/17/2021 ?OT Individual Time: 9924-2683 ?OT Individual Time Calculation (min): 61 min  ? ? ?Short Term Goals: ?Week 1:  OT Short Term Goal 1 (Week 1): Pt will complete UB dressing with supervision ?OT Short Term Goal 2 (Week 1): Pt will complete toileting with min assist ?OT Short Term Goal 3 (Week 1): Pt will complete LB dressing with min assist. ?OT Short Term Goal 4 (Week 1): Pt will demonstrate functional use of RUE as stabilizer with mod cueing to attend. ? ?Skilled Therapeutic Interventions/Progress Updates:  ?Pt greeted supine in bed agreeable to OT intervention. Session focus on BADL reeducation, functional mobility, dynamic standing balance, RUE coordination and decreasing overall caregiver burden.    ?      Pt completed supine>sit with CGA, MIN A for sit>stand and to stand pivot to w/c. Total A transport to gym with pt completing additional stand pivot to EOM with MIN A with Rw.  ?Worked on RUE AROM with RUE positioned on therapy with emphasis on scapular protraction/retraction 3/5. Pt worked on functional reaching for [employer] with pt needing support at elbow however pt able to gross grasp horseshoe with increased time and effort and MOD verbal cues for problem solving hand positioning.  Graded task up and had pt stand to reach for clothespins with RUE, pt needed support at R elbow and needed cues to problem solve how to pronate wrist to perform more functional grasp. Pt also completed seated NMR task with BUEs with pt holding 2 lb weighted ball and instructed to pronate/ supinate wrist while passing the ball from R hand to L hand 2x10 reps, pt also completed chest presses while holding 2 lb ball for 2x10 reps to promote improved RUE AROM and provide NMR to RUE. Pt completed stand pivot back to w/c with Rw and MINA. Pt reports need to void bladder once back in room,  MIN A for stand pivot to toilet with use pf grab bars, CGA while pt stood to urinate, MIN A for 3/3 toileting tasks. Pt pivoted back to wc with MIN A. Additional squat pivot back to EOB with no AD with MINA. pt left seated EOB with RN present.                   ? ? ?Therapy Documentation ?Precautions:  ?Precautions ?Precautions: Fall ?Precaution Comments: right hemi, mild right neglect, cortrak, urinary catheter, BP<160 ?Restrictions ?Weight Bearing Restrictions: No ? ?Pain: no pain reported during session  ? ? ? ?Therapy/Group: Individual Therapy ? ?[name] ?11/17/2021, 3:47 PM ?

## 2021-11-17 NOTE — Progress Notes (Signed)
Speech Language Pathology Daily Session Note ? ?Patient Details  ?Name: Paul [name] ?MRN: [id number] ?Date of Birth: [birth year] ? ?Today's Date: 11/17/2021 ?SLP Individual Time: [id number] ?SLP Individual Time Calculation (min): 58 min ? ?Short Term Goals: ?Week 1: SLP Short Term Goal 1 (Week 1): Patient will consume ice chips/thin liquid trials with min overt s/sx of aspiration to indicate readiness for MBS. ?SLP Short Term Goal 2 (Week 1): Patient will produce phonemes (/a/, /m/) with appropriate voicing in 80% of opportunities given max verbal/visual cues. ?SLP Short Term Goal 3 (Week 1): Patient will demonstrate use of diaphragmatic breathing with minA. ?SLP Short Term Goal 4 (Week 1): Pt will complete effortful swallows during ice chip/water trials to increase pharyngeal strength with minA multimodal cues. ? ?Skilled Therapeutic Interventions: Skilled ST treatment focused on swallowing and communication goals. SLP facilitated oral care with suction toothbrush with min A for thoroughness. Oral gel applied to lips 2/2 dryness. Patient consumed ice chips x2 and tsp water x2 with overt s/sx of aspiration during 100% of trials. Patient exhibited prolonged AP transit with minimal oral manipulation, suspected delayed swallow initiation particularly with ice chips vs. tsps, and decreased oral containment resulting in mild-to-moderate anterior spillage. Via text on phone, pt informed SLP that swallowing felt more effortful today and that he was very tired due to obtaining an MRI early this a.m. SLP provided education on plan of care regarding swallowing and anticipation of long-term source nutrition due to severity of swallowing deficits. SLP initiated discussion on PEG tube per MD request as discussed during team conference. Patient appeared receptive and requested to think about it more and would like to have further discussion with MD this week. SLP facilitated production of phonemes: voiced /a/, voiced /m/ and /p/  with overall min A verbal, mod A verbal/visual for /p/, and additional time (~15-30 seconds between repetitions). Pt exhibited hypernasality with voicing. Patient requested to get into bed at end of session. Pt performed sit-to-stand from recliner with sup A and pivoted to bed using RW and sup A to manage feeding tube. Patient was left in bed with alarm activated and immediate needs within reach at end of session. Continue per current plan of care.   ?   ? ?Pain ? None ? ?Therapy/Group: Individual Therapy ? ?Orrie Lascano T Charlie Char ?11/17/2021, 4:46 PM ?

## 2021-11-18 ENCOUNTER — Inpatient Hospital Stay (HOSPITAL_COMMUNITY): Payer: Medicaid Other

## 2021-11-18 LAB — GLUCOSE, CAPILLARY
Glucose-Capillary: 107 mg/dL — ABNORMAL HIGH (ref 70–99)
Glucose-Capillary: 113 mg/dL — ABNORMAL HIGH (ref 70–99)
Glucose-Capillary: 116 mg/dL — ABNORMAL HIGH (ref 70–99)
Glucose-Capillary: 119 mg/dL — ABNORMAL HIGH (ref 70–99)
Glucose-Capillary: 121 mg/dL — ABNORMAL HIGH (ref 70–99)
Glucose-Capillary: 98 mg/dL (ref 70–99)

## 2021-11-18 IMAGING — CT CT ABDOMEN W/O CM
2 of 4 series · 16 of 46 positions shown, 18 images · non-contrast
Comparison: Chest XR, [DATE].  KUB, [DATE].

CLINICAL DATA: anatomy eval for G tube placement

EXAM:
CT ABDOMEN WITHOUT CONTRAST
TECHNIQUE: Multidetector CT imaging of the abdomen was performed following the
standard protocol without IV contrast.
RADIATION DOSE REDUCTION: This exam was performed according to the
departmental dose-optimization program which includes automated
exposure control, adjustment of the mA and/or kV according to
patient size and/or use of iterative reconstruction technique.

[Series 3: a/p w/o 5mm · axial · non-contrast · 0.82mm/px · z∈[+93,+423]mm · 13 of 73 slices shown, 15 images]
[im 4/73  soft-tissue]
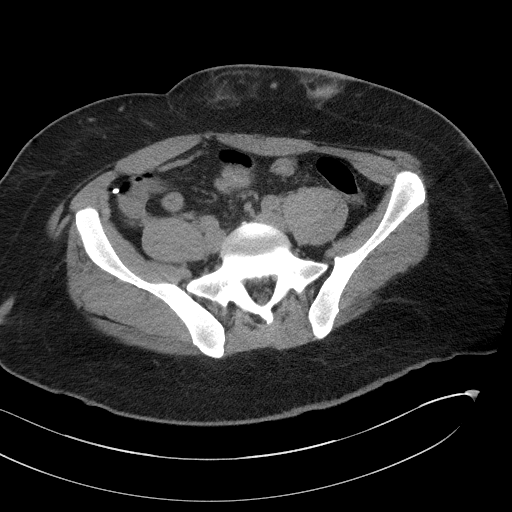
[im 4/73  bone]
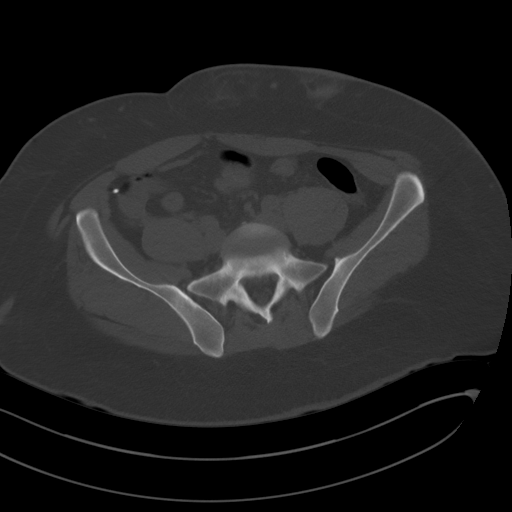
[im 10/73  soft-tissue]
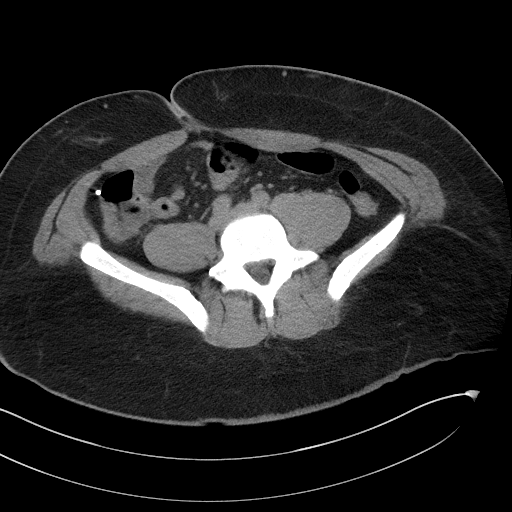
[im 16/73  soft-tissue]
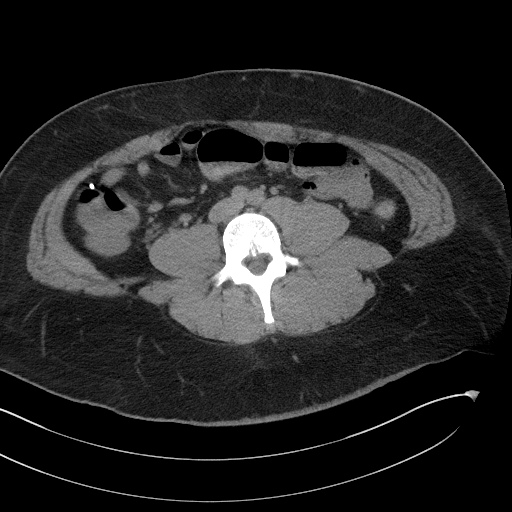
[im 22/73  soft-tissue]
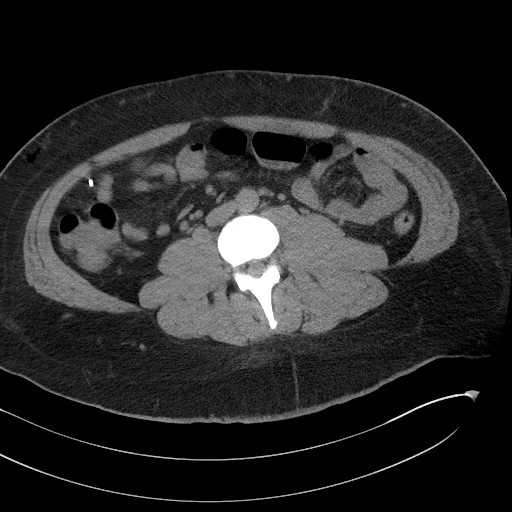
[im 25/73  soft-tissue]
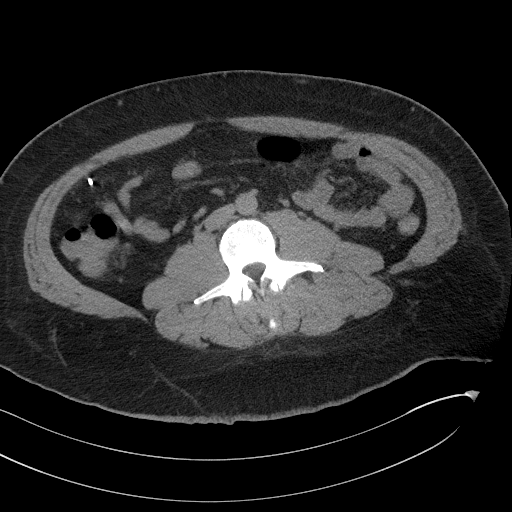
[im 31/73  soft-tissue]
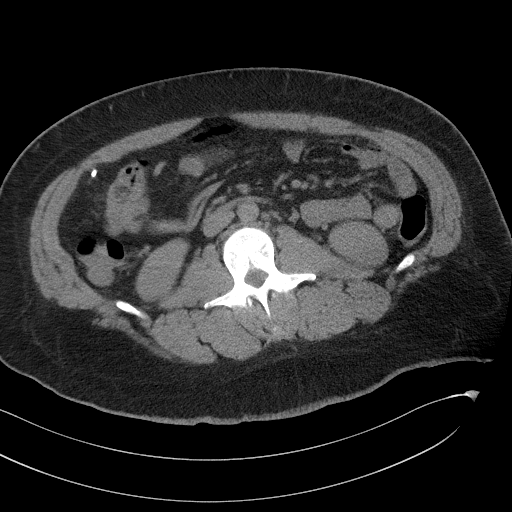
[im 37/73  soft-tissue]
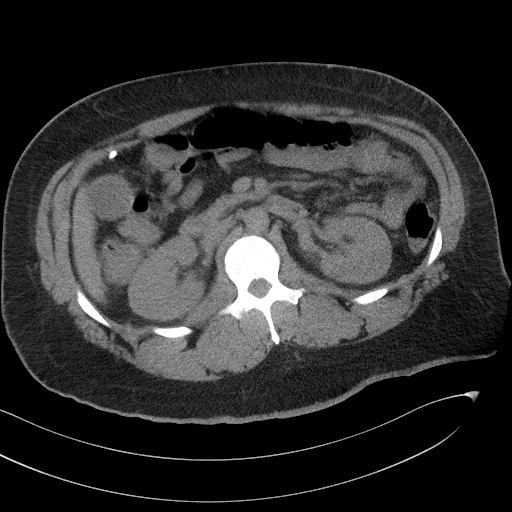
[im 43/73  soft-tissue]
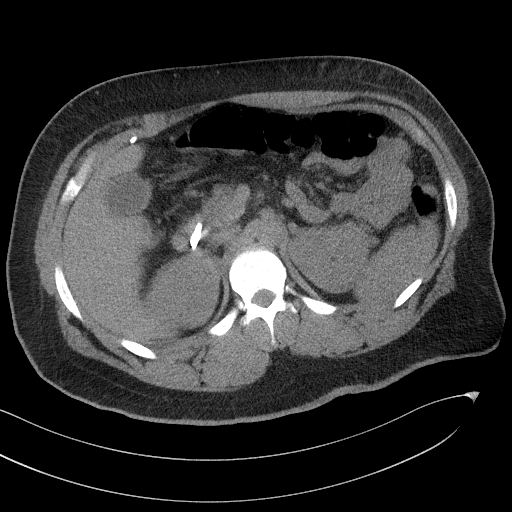
[im 49/73  soft-tissue]
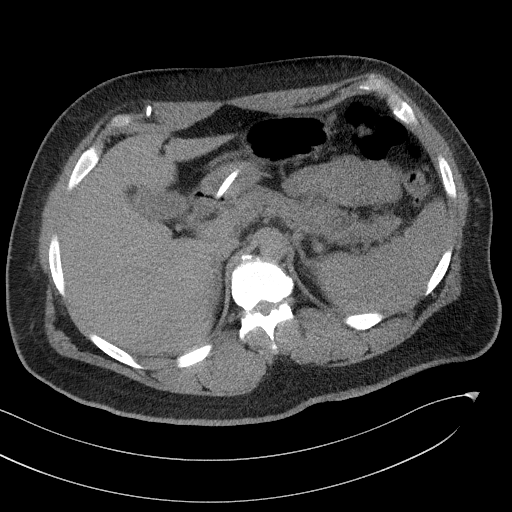
[im 49/73  bone]
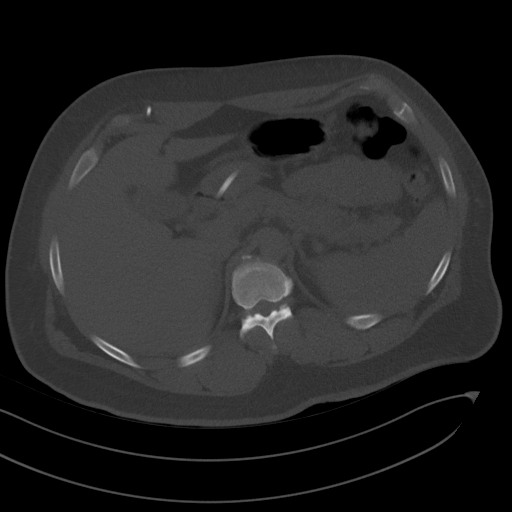
[im 52/73  soft-tissue]
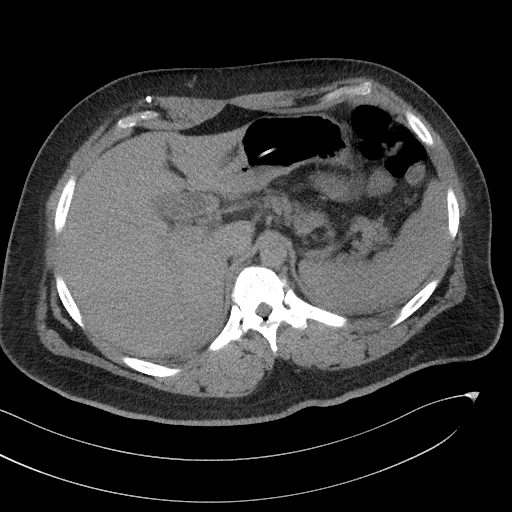
[im 58/73  soft-tissue]
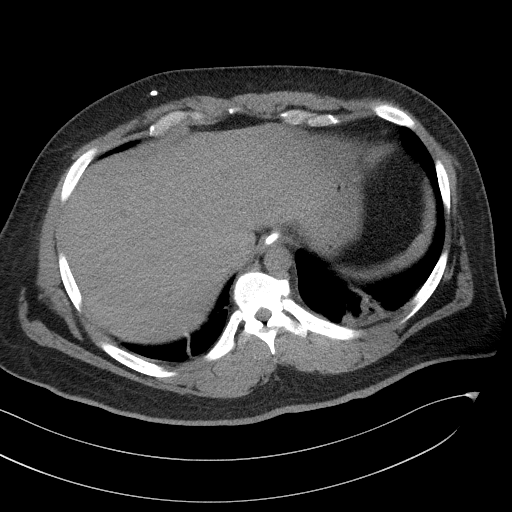
[im 64/73  soft-tissue]
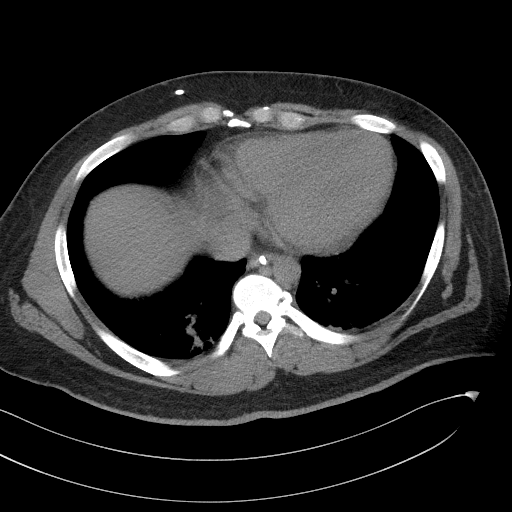
[im 70/73  soft-tissue]
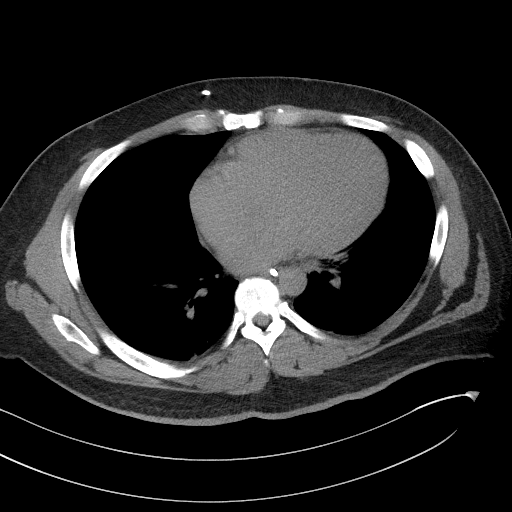

[Series 6: a/p w/o cor · coronal · non-contrast · 0.71mm/px · 3 of 153 slices shown]
[im 51/153  soft-tissue]
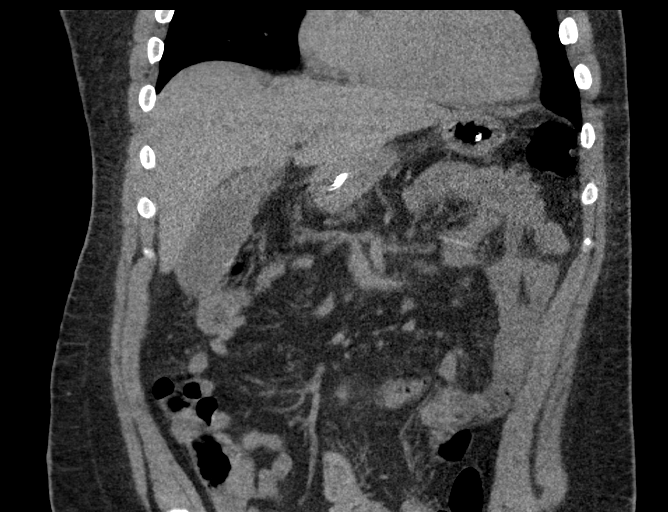
[im 68/153  soft-tissue]
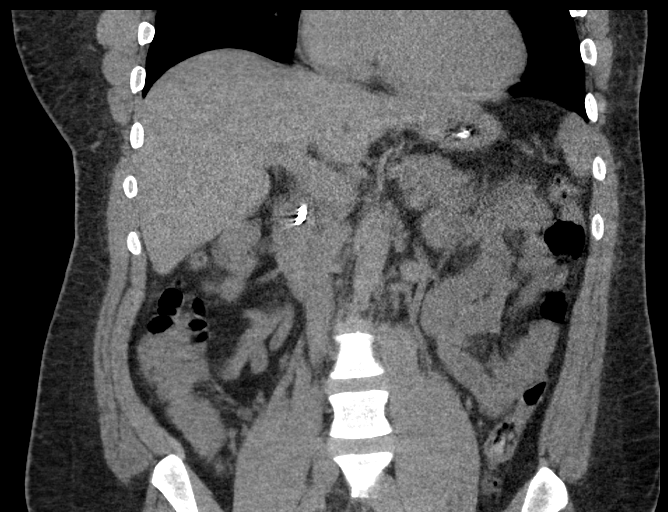
[im 85/153  soft-tissue]
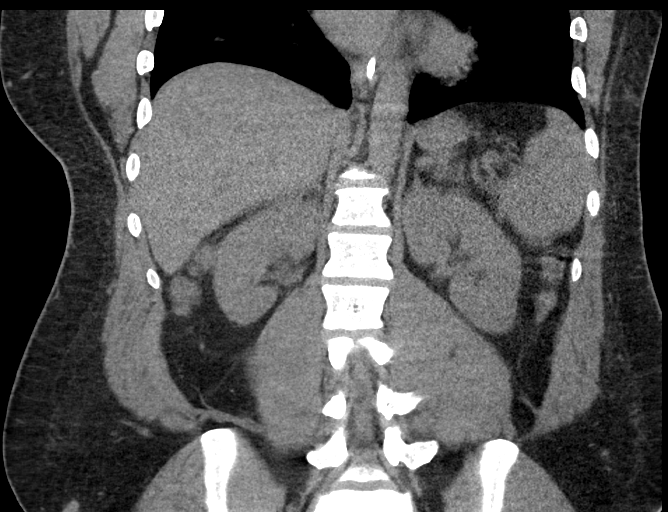

[16 of 46 positions shown; findings below may reference images not displayed]

FINDINGS: Lower chest: Trace bibasilar atelectasis.

Hepatobiliary: Normal noncontrast appearance of liver. No
gallstones, gallbladder wall thickening, or biliary dilatation.

Pancreas: No pancreatic ductal dilatation or surrounding
inflammatory changes.

Spleen: Normal in size without focal abnormality.

Adrenals/Urinary Tract: Adrenal glands are unremarkable. Kidneys are
normal, without renal calculi, focal lesion, or hydronephrosis.

Stomach/Bowel: Stomach is within normal limits. Transpyloric enteric
feeding tube, with tip within the duodenum. Visualized portions of
appendix appear normal. No evidence of bowel wall thickening,
distention, or inflammatory changes.

Vascular/Lymphatic: No enlarged abdominal or pelvic lymph nodes.

Other: A ventriculoperitoneal shunt is incompletely imaged, with the
distal portion seen coursing along the anterior RIGHT chest, RIGHT
abdomen, and tip descending into the pelvis via the RIGHT pericolic
gutter. Anterior abdominal wall contusions, likely injection sites.

Musculoskeletal: No acute osseous findings.
IMPRESSION: 1. No acute abdominal process.
2. Anatomy is amenable to percutaneous gastrostomy placement.

## 2021-11-18 MED ORDER — CEFAZOLIN SODIUM-DEXTROSE 2-4 GM/100ML-% IV SOLN: 2.0000 g | INTRAVENOUS | Status: AC

## 2021-11-18 NOTE — Progress Notes (Signed)
Occupational Therapy Session Note ? ?Patient Details  ?Name: Paul [name] ?MRN: [id number] ?Date of Birth: February 05, 1986 ? ?Today's Date: 11/18/2021 ?OT Individual Time: 9323-5573 ?OT Individual Time Calculation (min): 39 min  ? ? ?Short Term Goals: ?Week 1:  OT Short Term Goal 1 (Week 1): Pt will complete UB dressing with supervision ?OT Short Term Goal 2 (Week 1): Pt will complete toileting with min assist ?OT Short Term Goal 3 (Week 1): Pt will complete LB dressing with min assist. ?OT Short Term Goal 4 (Week 1): Pt will demonstrate functional use of RUE as stabilizer with mod cueing to attend. ? ?Skilled Therapeutic Interventions/Progress Updates:  ?Pt greeted seated in recliner    agreeable to OT intervention. Session focus on BADL reeducation, functional mobility, dynamic standing balance and decreasing overall caregiver burden.   Pt completed functional ambulation from recliner>shower with MIN A with RW, MIN A and MOD verbal cues needed to sequence stepping into shower. Pt completed bathing from shower seat with overall MOD A needing hand over hand assist to wash LUE. Pt able to stand with MIN A while pt washed buttock in standing. Pt exited shower with MOD A via stand pivot to w/c with increased support needed for safety d/t wet shower environment and MOD verbal cues for sequencing transfer. Pt transported out of shower with total A, pt completed dressing from w/c with MOD A for UB dressing with MOD cues needed to sequence hemi strategies. Pt completed LB dressing with MAX A needed to thread pants and pull pants up to waist line in standing. Pt needed set- up assist for oral care with use of suction toothbrush. Pt completed stand pivot back to recliner with Rw and MINA.  pt left seated in recliner with alarm belt activated and all needs within reach.                    ? ?Pt continues to use gestures to communicate effectively  ?Therapy Documentation ?Precautions:  ?Precautions ?Precautions: Fall ?Precaution  Comments: right hemi, mild right neglect, cortrak, urinary catheter, BP<160 ?Restrictions ?Weight Bearing Restrictions: No ? ?Pain: no pain reported during session  ? ? ?Therapy/Group: Individual Therapy ? ?[name] ?11/18/2021, 9:58 AM ?

## 2021-11-18 NOTE — Progress Notes (Signed)
?                                                       PROGRESS NOTE ? ? ?Subjective/Complaints: ? ?Congestion , cough last noc no SOB, no fevere  ? ?Had cardiac MRI showing LVH and septal hypertrophy consistent with hx HTN but no evidence of amyloidosis ? ?ROS: limited due to cognition/ aphasia ?Objective: ?  ?DG Skull 1-3 Views ? ?Result Date: 11/16/2021 ?CLINICAL DATA:  Ventricular shunt placement EXAM: SKULL - 1-3 VIEW COMPARISON:  No prior skull radiograph, correlation is made with 11/11/2021 CTA head neck FINDINGS: Prior right cranioplasties. Left frontal approach ventriculostomy catheter. Additional shunt catheter enters through a left inferior parietal burr hole. Shunt catheter tubing is seen more inferiorly, in the left neck, but no radiopaque tubing appears to connect the intracranial shunt catheters with the neck tubing. A nasogastric tube is noted. Hypoplastic right frontal sinus. The remaining sinuses are patent. IMPRESSION: Right frontal and left inferior parietal approach ventriculostomy catheters, without radiopaque tubing connecting these to the shunt tubing in the left neck. Correlate with expected catheter tubing locations. Electronically Signed   By: Merilyn Baba M.D.   On: 11/16/2021 16:58  ? ?MR BRAIN WO CONTRAST ? ?Result Date: 11/17/2021 ?CLINICAL DATA:  Stroke follow-up. EXAM: MRI HEAD WITHOUT CONTRAST TECHNIQUE: Multiplanar, multiecho pulse sequences of the brain and surrounding structures were obtained without intravenous contrast. COMPARISON:  Head and neck CTA 11/11/2021 FINDINGS: Brain: A 2.2 x 1.9 x 2.5 cm hemorrhage centered in the left putamen with mild surrounding edema is unchanged. A left frontal approach ventriculostomy catheter is again noted terminating in the frontal horn of the right lateral ventricle, and there is gliosis along the catheter in the left frontal lobe. There is prominent susceptibility artifact from the external portion of the shunt which obscures portions of  the left frontal lobe, particularly on diffusion weighted and susceptibility weighted imaging limiting assessment for acute infarct. Within this limitation, no sizable acute infarct is identified separate from the left basal ganglia hemorrhage. An additional catheter is again noted coursing over the lateral left cerebral convexity in terminating in the left middle cranial fossa. There are numerous chronic microhemorrhages in the cerebrum (particularly right frontotemporal region) and cerebellum. The ventricles are normal in size. Chronic infarcts are noted in the inferior right cerebellar hemisphere and left medulla. Chronic lacunar infarcts versus dilated perivascular spaces are noted in the basal ganglia bilaterally. There is no extra-axial fluid collection. Vascular: Major intracranial vascular flow voids are preserved. Skull and upper cervical spine: Bilateral craniotomies. Sinuses/Orbits: Unremarkable orbits. Mild mucosal thickening in the paranasal sinuses. Small left mastoid effusion. Other: None. IMPRESSION: 1. Unchanged 2.5 cm left basal ganglia hemorrhage with mild surrounding edema. 2. Chronic microhemorrhages in the cerebrum and cerebellum which may be related to the patient's history of hypertension and traumatic brain injury. 3. 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 Electronically Signed   By: Logan Bores M.D.   On: 11/17/2021 08:59  ? ?MR CARDIAC MORPHOLOGY W WO CONTRAST ? ?Result Date: 11/17/2021 ?CLINICAL DATA:  Evaluate for amyloid EXAM: CARDIAC MRI TECHNIQUE: The patient was scanned on a 1.5 Tesla Siemens magnet. A dedicated cardiac coil was used. Functional imaging was done using Fiesta sequences. 2,3, and 4 chamber views were done to assess for RWMA's. Modified Simpson's  rule using a short axis stack was used to calculate an ejection fraction on a dedicated work Conservation officer, nature. The patient received 10 cc of Gadavist. After 10 minutes inversion recovery sequences  were used to assess for infiltration and scar tissue. CONTRAST:  10 cc  of Gadavist FINDINGS: Left ventricle: -Asymmetric hypertrophy measuring 31m in septum (187min posterior wall) -Normal size -Normal systolic function -Normal ECV (27%) -Patchy LGE at RV insertion site and basal septum. LGE accounts for 1% of total myocardial mass LV EF: 53% (Normal 56-78%) Absolute volumes: LV EDV: 22794mNormal 77-195 mL) LV ESV: 105m63mormal 19-72 mL) LV SV: 121mL3mrmal 51-133 mL) CO: 7.9L/min (Normal 2.8-8.8 L/min) Indexed volumes: LV EDV: 87mL/58m (Normal 47-92 mL/sq-m) LV ESV: 41mL/s36m(Normal 13-30 mL/sq-m) LV SV: 47mL/sq15mNormal 32-62 mL/sq-m) CI: 3.1L/min/sq-m (Normal 1.7-4.2 L/min/sq-m) Right ventricle: Normal size and systolic function RV EF:  54% (Normal 47-74%) Absolute volumes: RV EDV: 212mL (No102m 88-227 mL) RV ESV: 97mL (Nor11m23-103 mL) RV SV: 115mL (Norm5m2-138 mL) CO: 7.5L/min (Normal 2.8-8.8 L/min) Indexed volumes: RV EDV: 82mL/sq-m (8mal 55-105 mL/sq-m) RV ESV: 37mL/sq-m (N80ml 15-43 mL/sq-m) RV SV: 44mL/sq-m (No67m 32-64 mL/sq-m) CI: 2.9L/min/sq-m (Normal 1.7-4.2 L/min/sq-m) Left atrium: Normal size Right atrium: Mild enlargement Mitral valve: No regurgitation Aortic valve: Mild regurgitation. Bicuspid aortic valve with fusion of left and right cusps Tricuspid valve: No regurgitation Pulmonic valve: No regurgitation Aorta: Normal proximal ascending aorta Pulmonary artery: Dilated main PA measuring 30mm Pericardi23mSmall effusion IMPRESSION: 1.  No evidence of cardiac amyloidosis 2. Asymmetric LV hypertrophy measuring 21mm in septum 66mm in posterio53mll), consistent with hypertrophic cardiomyopathy 3. Patchy late gadolinium enhancement at RV insertion site and basal septum, consistent with HCM. LGE accounts for 1% of total myocardial mass 4.  Bicuspid aortic valve with fusion of left and right cusps 5.  Normal LV size and systolic function (EF 53%) 6.  Normal R33%ize and systolic function  (EF 54%) 7.  Small pe54%ardial effusion Electronically Signed   By: Christopher  SchuOswaldo Milian/2023 23:57   ?No results for input(s): WBC, HGB, HCT, PLT in the last 72 hours. ? ?Recent Labs  ?  11/16/21 ?0707  ?NA 138  ?K 3.7  ?CL 102  ?CO2 26  ?GLUCOSE 122*  ?BUN 44*  ?CREATININE 1.47*  ?CALCIUM 9.2  ? ? ? ?Intake/Output Summary (Last 24 hours) at 11/18/2021 0858 ?Last data filed at 11/18/2021 0520 ?Gross per 24 hour  ?Intake --  ?Output 1251 ml  ?Net -1251 ml  ? ?  ? ?  ? ?Physical Exam: ?Vital Signs ?Blood pressure 136/77, pulse 61, temperature 98.4 ?F (36.9 ?C), temperature source Axillary, resp. rate 18, height '6\' 3"'  (1.905 m), weight 130.5 kg, SpO2 98 %. ? ? ? ?General: No acute distress ?Mood and affect are appropriate ?Heart: Regular rate and rhythm no rubs murmurs or extra sounds ?Lungs: Clear to auscultation, breathing unlabored, no rales or wheezes, coarse upper airway sounds  ?Abdomen: Positive bowel sounds, soft nontender to palpation, nondistended ?Extremities: No clubbing, cyanosis, or edema ?Skin: No evidence of breakdown, no evidence of rash ? ? ?Skew deviation RIght eye  ?Musculoskeletal: Full range of motion in all 4 extremities. No joint swelling ?dysarthric,dysphonic. Has word finding deficits but comprehension appears reasonable.  RUE 2+ prox to 1/5 distally. RLE 3-4/5. LUE and LLE 5/5. Senses pain on right ?Uro: foley ?Musculoskeletal: pain with compression of Right met heads, no joint deformity or swelling  ? ? ?Assessment/Plan: ?  1. Functional deficits which require 3+ hours per day of interdisciplinary therapy in a comprehensive inpatient rehab setting. ?Physiatrist is providing close team supervision and 24 hour management of active medical problems listed below. ?Physiatrist and rehab team continue to assess barriers to discharge/monitor patient progress toward functional and medical goals ? ?Care Tool: ? ?Bathing ? Bathing activity did not occur: Safety/medical concerns ?Body  parts bathed by patient: Right arm, Chest, Abdomen, Front perineal area, Left arm, Right upper leg, Left upper leg, Right lower leg, Left lower leg, Face  ? Body parts bathed by helper: Buttocks ?  ?  ?Ba

## 2021-11-18 NOTE — Progress Notes (Signed)
Physical Therapy Session Note ? ?Patient Details  ?Name: Paul [name] ?MRN: [id number] ?Date of Birth: 1986/01/16 ? ?Today's Date: 11/18/2021 ?PT Individual Time: 4970-2637 ?PT Individual Time Calculation (min): 43 min  ? ?Short Term Goals: ?Week 1:  PT Short Term Goal 1 (Week 1): Patient will perform basic transfers with CGA consistently. ?PT Short Term Goal 2 (Week 1): Patient will ambulate >100 feet using LRAD with min A. ?PT Short Term Goal 3 (Week 1): Patient will improve Berg Balance Scale by at least 7 points to meet MCID for improved postural control and reduced fall risk. ? ?Skilled Therapeutic Interventions/Progress Updates:  ?  Pt received supine in bed with his father present and pt agreeable to therapy session. Nurse notified and present to disconnect tube feedings for session - notified at end of session to reconnect. Pt continues to remain non-verbal throughout session using either head nods, thumbs up/down, or texting on his phone for communication.  ? ?Pt reports plan is for him to D/C back home with his mother in a 1story home with no STE. Pt reports his mother does not work and is available 24/7; however, she can only provide CGA/supervision level assistance.  ? ?Supine>sitting R EOB, HOB elevated but not using bedrail, with close supervision and increased time/effort for trunk upright. Sitting EOB, donned tennis shoes total assist for time. ? ?Sit>stand EOB>RW with CGA/light min assist - cuing for R hand placement on RW orthosis and assist for strap management.  ? ?Gait training ~249ft to dayroom mat using RW with min assist of 1 for balance - cuing for L weight shift and increased R LE hip/knee flexion for foot clearance during swing as well as for longer R LE step length to achieve reciprocal stepping pattern - cuing for AD management when turning to increase R UE involvement in this. ? ?Dynamic standing balance task via alternate foot taps to 6" step without UE support - mirror feedback - min  assist for balance due to primarily posterior LOB - pt able to place R foot up on step but requires cuing for increased knee flexion to avoid kicking the step first. Progressed to same task but with 5lb ankle weight on R LE to drive increased neural recruitment. ? ?Gait training ~1104ft using RW while wearing 5lb ankle weight on R LE targeting increased neural recruitment to promote more hip/knee flexion for foot clearance during swing - continued min assist for balance. ? ?Doffed ankle weight. Gait training ~273ft back to room using RW with min assist and continued cuing as described above, especially with fatigue as pt has worsening R foot drag during swing. ? ?At end of session, pt left seated in recliner with needs in reach, lines intact, and seat belt alarm on. ? ?Therapy Documentation ?Precautions:  ?Precautions ?Precautions: Fall ?Precaution Comments: right hemi, mild right neglect, cortrak, urinary catheter, BP<160 ?Restrictions ?Weight Bearing Restrictions: No ? ? ?Pain: ? No reports or indications of pain throughout session. ? ? ?Therapy/Group: Individual Therapy ? ?Ginny Forth , PT, DPT, NCS, CSRS ? ?11/18/2021, 3:28 PM  ?
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
## 2021-11-18 NOTE — Progress Notes (Addendum)
Speech Language Pathology Daily Session Note ? ?Patient Details  ?Name: Paul [name] ?MRN: [id number] ?Date of Birth: [birth year] ? ?Today's Date: 11/18/2021 ?SLP Individual Time: 1100-1200 ?SLP Individual Time Calculation (min): 60 min ? ?Short Term Goals: ?Week 1: SLP Short Term Goal 1 (Week 1): Patient will consume ice chips/thin liquid trials with min overt s/sx of aspiration to indicate readiness for MBS. ?SLP Short Term Goal 2 (Week 1): Patient will produce phonemes (/a/, /m/) with appropriate voicing in 80% of opportunities given max verbal/visual cues. ?SLP Short Term Goal 3 (Week 1): Patient will demonstrate use of diaphragmatic breathing with minA. ?SLP Short Term Goal 4 (Week 1): Pt will complete effortful swallows during ice chip/water trials to increase pharyngeal strength with minA multimodal cues. ? ?Skilled Therapeutic Interventions: Skilled ST treatment focused on swallowing and speech goals. SLP facilitated oral care using suction toothbrush with set-up A. Patient participated in PO trials with ice chips and thin liquids via tsp. Pt exhibited decreased and effortful oral manipulation, significantly delayed AP transit, suspected swallow delay, and intermittent signs and symptoms of aspiration with both ice chips and tsp presentations. Pt with improved timeliness with tsp sips as compared to ice chips. It took patient multiple swallows and an average of 2.5-3 minutes to orally clear liquid from single ice chip; approximately 5-15 seconds for tsp sips. Recommend continuation of NPO status. Patient completed jaw grading exercises by masticating on straw for 30 second intervals x6 trials with mod A verbal and visual cues. Pt appeared to fatigue following 10-15 seconds. SLP facilitated lip pucker with use of straw to generate minimal seal. Pt generated enough pressure to inhale and exhale using straw during approximately 10% of occasions using tissue for visual feedback with max A. Patient reported he  spoke with MD regarding PEG tube and agreeable to move forward with placement. CT of abdomen scheduled today. Patient was left in recliner with alarm activated and immediate needs within reach at end of session. Continue per current plan of care.   ?   ? ?Pain ?Pain Assessment ?Pain Scale: 0-10 ?Pain Score: 0-No pain ? ?Therapy/Group: Individual Therapy ? ?Teighan Aubert T Renee Beale ?11/18/2021, 11:49 AM ?

## 2021-11-18 NOTE — Progress Notes (Signed)
Occupational Therapy Session Note ? ?Patient Details  ?Name: Paul [name] ?MRN: [id number] ?Date of Birth: [birth year] ? ?Today's Date: 11/18/2021 ?OT Individual Time: 9233-0076 ?OT Individual Time Calculation (min): 29 min  ? ? ?Short Term Goals: ?Week 1:  OT Short Term Goal 1 (Week 1): Pt will complete UB dressing with supervision ?OT Short Term Goal 2 (Week 1): Pt will complete toileting with min assist ?OT Short Term Goal 3 (Week 1): Pt will complete LB dressing with min assist. ?OT Short Term Goal 4 (Week 1): Pt will demonstrate functional use of RUE as stabilizer with mod cueing to attend. ? ?Skilled Therapeutic Interventions/Progress Updates:  ?Pt greeted supine in bed , pt agreeable to OT intervention. Session focus on RUE coordination. Pt completed all therapeutic exercises from EOB with an emphasis on wrist supination/pronation, composite digit flexion/extension, wrist flexion extension, and horizontal shoulder ABD/ADD.  ?Pt completed 2x10 reps of horizontal shoulder ABD/ADD with pt sliding wash cloth R and L on side table with an emphasis on grasping wash cloth promoting composite digit flexion/extension.  ?Pt also worked on flipping bean bag from R to L hand working on wrist supination/pronation.  ?Also placed small ball in pts RUE and had pt work on tossing ball in the air promoting wrist flexion.  ?Pt also used hand over hand assist to roll small ball forward on BSC working on gentle AAROM in RUE. Pt left supine in bed with bed alarm activated and all needs within reach.  ? ?Therapy Documentation ?Precautions:  ?Precautions ?Precautions: Fall ?Precaution Comments: right hemi, mild right neglect, cortrak, urinary catheter, BP<160 ?Restrictions ?Weight Bearing Restrictions: No ?Pain: ?No s/s of pain during session  ? ? ?Therapy/Group: Individual Therapy ? ?[name] ?11/18/2021, 3:27 PM ?

## 2021-11-19 ENCOUNTER — Inpatient Hospital Stay (HOSPITAL_COMMUNITY): Payer: Medicaid Other

## 2021-11-19 HISTORY — PX: IR GASTROSTOMY TUBE MOD SED: IMG625

## 2021-11-19 LAB — GLUCOSE, CAPILLARY
Glucose-Capillary: 100 mg/dL — ABNORMAL HIGH (ref 70–99)
Glucose-Capillary: 118 mg/dL — ABNORMAL HIGH (ref 70–99)
Glucose-Capillary: 125 mg/dL — ABNORMAL HIGH (ref 70–99)
Glucose-Capillary: 78 mg/dL (ref 70–99)
Glucose-Capillary: 93 mg/dL (ref 70–99)

## 2021-11-19 LAB — JAK2  V617F QUAL. WITH REFLEX TO EXON 12: Reflex:: 15

## 2021-11-19 IMAGING — XA IR PERC PLACEMENT GASTROSTOMY
2 series · 10 of 10 positions shown · non-contrast
Comparison: none

CLINICAL DATA: Hemorrhagic stroke, dysphagia and need for
percutaneous gastrostomy tube for chronic nutrition.

[Series 1: fl (-) angio · 2 acquisitions, 5 frames shown (1 of 2)]
[im 1/2]
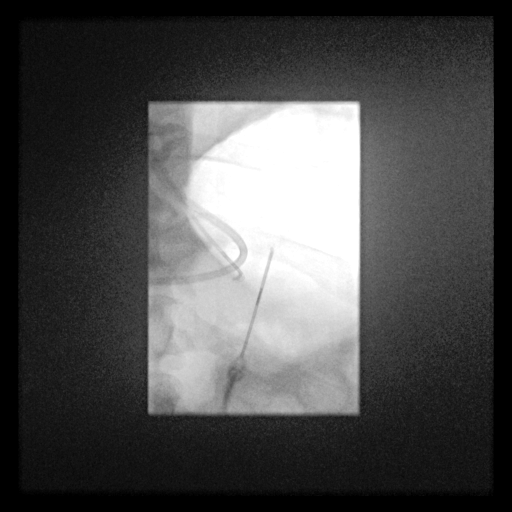
[im 1/2]
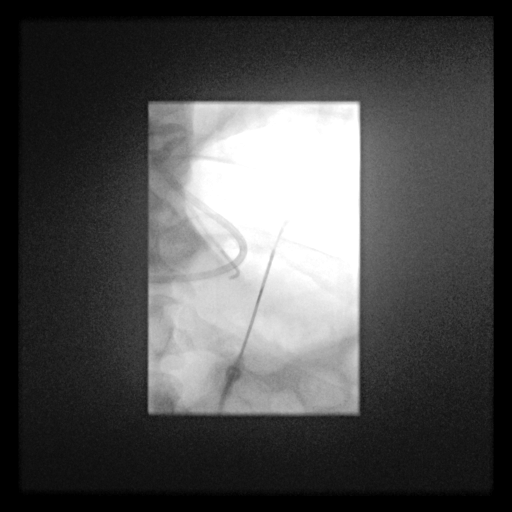
[im 1/2]
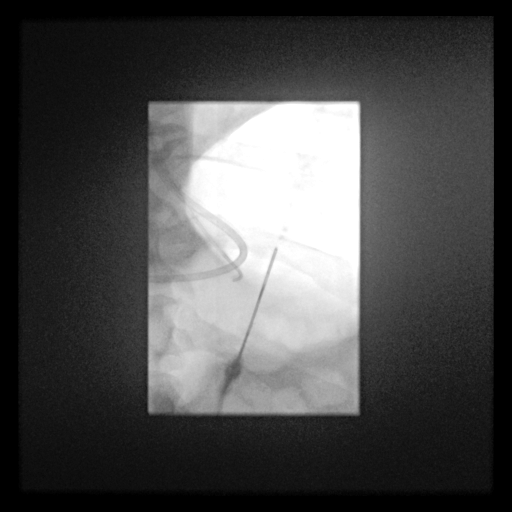
[im 1/2]
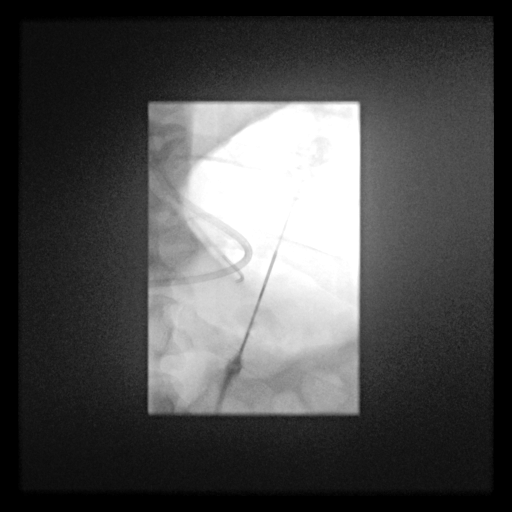
[im 2/2]
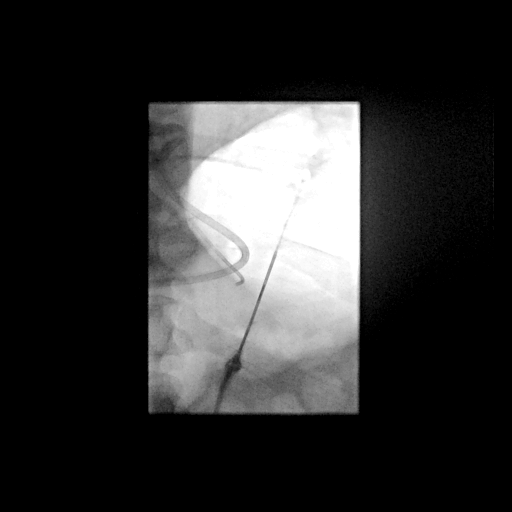

[Series 2: fl (-) angio · 2 acquisitions, 5 frames shown (2 of 2)]
[im 1/2]
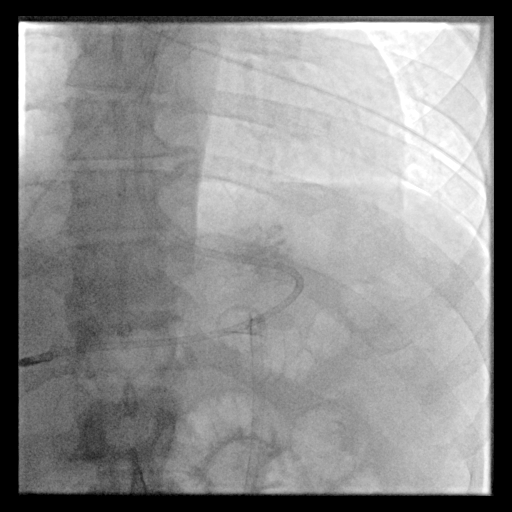
[im 1/2]
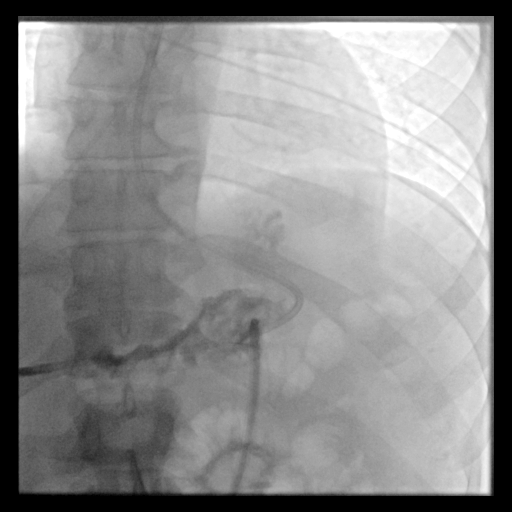
[im 1/2]
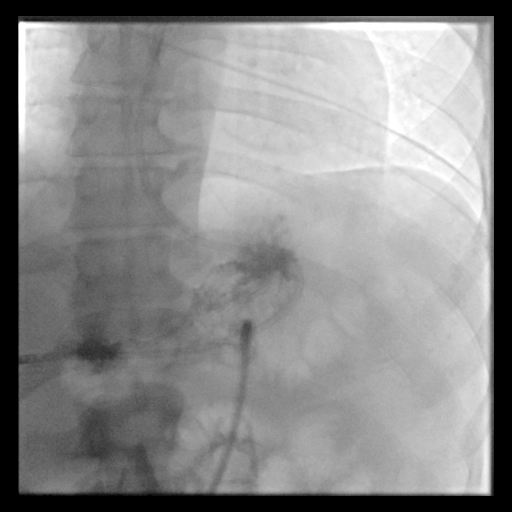
[im 1/2]
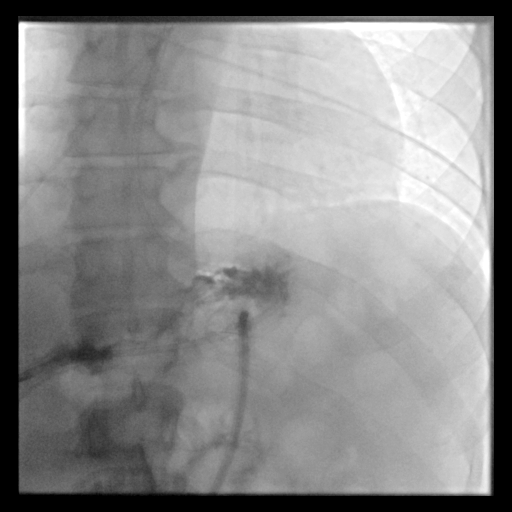
[im 2/2]
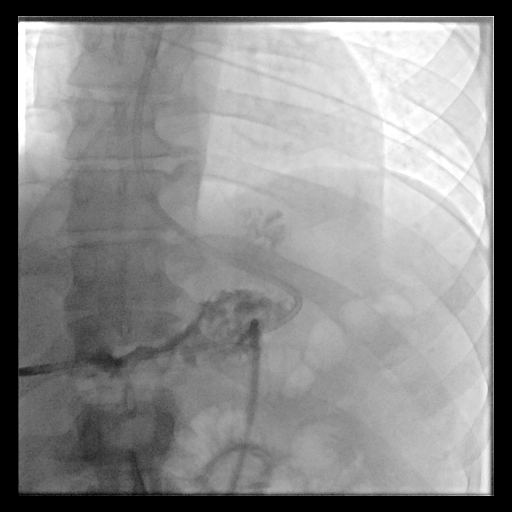

[10 of 10 positions shown; findings below may reference images not displayed]

EXAM:
PERCUTANEOUS GASTROSTOMY TUBE PLACEMENT

ANESTHESIA/SEDATION:
Moderate (conscious) sedation was employed during this procedure. A
total of Versed 2.0 mg and Fentanyl 75 mcg was administered
intravenously.

Moderate Sedation Time: 12 minutes. The patient's level of
consciousness and vital signs were monitored continuously by
radiology nursing throughout the procedure under my direct
supervision.

CONTRAST:  20 mL Omnipaque 300

MEDICATIONS:
2 g IV Ancef. IV antibiotic was administered in an appropriate time
interval prior to needle puncture of the skin.

FLUOROSCOPY TIME:  54 seconds.  6.0 mGy.

PROCEDURE:
The procedure, risks, benefits, and alternatives were explained to
the patient's mother. Questions regarding the procedure were
encouraged and answered. The patient's mother understands and
consents to the procedure. A time-out was performed prior to
initiating the procedure.

A 5-French catheter was then advanced through the patient's mouth
under fluoroscopy into the esophagus and to the level of the
stomach. This catheter was used to insufflate the stomach with air
under fluoroscopy.

The abdominal wall was prepped with chlorhexidine in a sterile
fashion, and a sterile drape was applied covering the operative
field. A sterile gown and sterile gloves were used for the
procedure. Local anesthesia was provided with 1% Lidocaine.

A skin incision was made in the upper abdominal wall. Under
fluoroscopy, an 18 gauge trocar needle was advanced into the
stomach. Contrast injection was performed to confirm intraluminal
position of the needle tip. A single T tack was then deployed in the
lumen of the stomach. This was brought up to tension at the skin
surface. Over a guidewire, a 9-French sheath was advanced into the
lumen of the stomach. The wire was left in place as a safety wire. A
loop snare device from a percutaneous gastrostomy kit was then
advanced into the stomach.

A floppy guide wire was advanced through the orogastric catheter
under fluoroscopy in the stomach. The loop snare was advanced
through the percutaneous gastric access was used to snare the guide
wire. This allowed withdrawal of the loop snare out of the patient's
mouth by retraction of the orogastric catheter and wire.

A 20-French bumper retention gastrostomy tube was looped around the
snare device. It was then pulled back through the patient's mouth.
The retention bumper was brought up to the anterior gastric wall.
The T tack suture was cut at the skin. The exiting gastrostomy tube
was cut to appropriate length and a feeding adapter applied. The
catheter was injected with contrast material to confirm position and
a fluoroscopic spot image saved. The tube was then flushed with
saline. A dressing was applied over the gastrostomy exit site.

COMPLICATIONS:
None.
FINDINGS: The stomach distended well with air allowing safe placement of the
gastrostomy tube. After placement, the tip of the gastrostomy tube
lies in the body of the stomach.
IMPRESSION: Percutaneous gastrostomy with placement of a 20-French bumper
retention tube in the body of the stomach. This tube can be used for
percutaneous feeds beginning in 24 hours after placement.

## 2021-11-19 MED ORDER — LIDOCAINE HCL 1 % IJ SOLN
INTRAMUSCULAR | Status: AC
  Filled 2021-11-19: qty 20

## 2021-11-19 MED ORDER — GLUCAGON HCL RDNA (DIAGNOSTIC) 1 MG IJ SOLR
INTRAMUSCULAR | Status: AC
  Filled 2021-11-19: qty 1

## 2021-11-19 MED ORDER — MIDAZOLAM HCL 2 MG/2ML IJ SOLN
INTRAMUSCULAR | Status: AC
  Filled 2021-11-19: qty 4

## 2021-11-19 MED ORDER — FENTANYL CITRATE (PF) 100 MCG/2ML IJ SOLN
INTRAMUSCULAR | Status: AC
  Filled 2021-11-19: qty 4

## 2021-11-19 MED ORDER — LIDOCAINE HCL 1 % IJ SOLN
INTRAMUSCULAR | Status: AC | PRN
  Administered 2021-11-19: 10 mL

## 2021-11-19 MED ORDER — CLONIDINE HCL 0.2 MG/24HR TD PTWK
0.2000 mg | MEDICATED_PATCH | TRANSDERMAL | Status: DC
  Administered 2021-11-19: 0.2 mg via TRANSDERMAL
  Filled 2021-11-19: qty 1

## 2021-11-19 MED ORDER — FENTANYL CITRATE (PF) 100 MCG/2ML IJ SOLN
INTRAMUSCULAR | Status: AC | PRN
  Administered 2021-11-19: 25 ug via INTRAVENOUS
  Administered 2021-11-19: 50 ug via INTRAVENOUS

## 2021-11-19 MED ORDER — MIDAZOLAM HCL 2 MG/2ML IJ SOLN
INTRAMUSCULAR | Status: AC | PRN
Start: 2021-11-19 — End: 2021-11-19
  Administered 2021-11-19 (×2): 1 mg via INTRAVENOUS

## 2021-11-19 MED ORDER — LORATADINE 10 MG PO TABS
10.0000 mg | ORAL_TABLET | Freq: Every day | ORAL | Status: DC
  Administered 2021-11-20 – 2021-11-30 (×11): 10 mg
  Filled 2021-11-19 (×11): qty 1

## 2021-11-19 MED ORDER — CEFAZOLIN SODIUM-DEXTROSE 2-4 GM/100ML-% IV SOLN
INTRAVENOUS | Status: AC
  Administered 2021-11-19: 2 g via INTRAVENOUS
  Filled 2021-11-19: qty 100

## 2021-11-19 MED ORDER — IOHEXOL 300 MG/ML  SOLN
100.0000 mL | Freq: Once | INTRAMUSCULAR | Status: AC | PRN
Start: 2021-11-19 — End: 2021-11-19
  Administered 2021-11-19: 20 mL

## 2021-11-19 NOTE — Progress Notes (Signed)
Occupational Therapy Session Note ? ?Patient Details  ?Name: Paul [name] ?MRN: [id number] ?Date of Birth: [birth year] ? ?Today's Date: 11/19/2021 ?OT Individual Time: 9242-6834 ?OT Individual Time Calculation (min): 59 min  ? ? ?Short Term Goals: ?Week 1:  OT Short Term Goal 1 (Week 1): Pt will complete UB dressing with supervision ?OT Short Term Goal 2 (Week 1): Pt will complete toileting with min assist ?OT Short Term Goal 3 (Week 1): Pt will complete LB dressing with min assist. ?OT Short Term Goal 4 (Week 1): Pt will demonstrate functional use of RUE as stabilizer with mod cueing to attend. ? ?Skilled Therapeutic Interventions/Progress Updates:  ?Pt greeted supine in bed agreeable to OT intervention. Session focus on BADL reeducation, functional mobility, dynamic standing balance, RUE AAROM/coordination  and decreasing overall caregiver burden.       ?Pt completed supine>sit exiting to R side of bed with MIN A to elevate trunk, pt sit>stand with MIN A from EOB with RW, total A don R hand in RW splint. Pt ambulated to bathroom with RW and MIN A, MIN A and MOD verbal cues to sequence stand pivot transfer into shower. ? ?Pt completed bathing from shower seat with MOD A with pt using bathmit to wash LUE with RUE, pt able to stand in shower with CGA to wash buttock. Pt exited shower via functional mobility with Rw and MINA. Pt completed dressing from EOB with MOD A for UB dressing and MOD A for LB dressing.  ? ?Remainder of session focused on AAROM with RUE using beezy board to facilitate active assist ROM and eliminate gravity, pt completed x10 reps of horizontal shoulder ABD/ADD and forward flexion/extension only to 90* with slight incline provided from [employer].   ?Pt completed supine>sit with supervision with pt left  supine in bed with bed alarm activated and all needs within reach.  ? ?Of note pts eyes were red after shower, pt reports its from the water and this normal for him.                    ?Therapy  Documentation ?Precautions:  ?Precautions ?Precautions: Fall ?Precaution Comments: right hemi, mild right neglect, cortrak, urinary catheter, BP<160 ?Restrictions ?Weight Bearing Restrictions: No ? ?Pain: no pain reported during session  ? ? ? ?Therapy/Group: Individual Therapy ? ?[name] ?11/19/2021, 12:17 PM ?

## 2021-11-19 NOTE — Progress Notes (Signed)
?                                                       PROGRESS NOTE ? ? ?Subjective/Complaints: ? ?Scheduled for PEG today , CT Abdomen showed favorable anatomy ? ?ROS: limited due to cognition/ aphasia ?Objective: ?  ?CT ABDOMEN WO CONTRAST ? ?Result Date: 11/18/2021 ?CLINICAL DATA:  anatomy eval for G tube placement EXAM: CT ABDOMEN WITHOUT CONTRAST TECHNIQUE: Multidetector CT imaging of the abdomen was performed following the standard protocol without IV contrast. RADIATION DOSE REDUCTION: This exam was performed according to the departmental dose-optimization program which includes automated exposure control, adjustment of the mA and/or kV according to patient size and/or use of iterative reconstruction technique. COMPARISON:  Chest XR, 11/11/2021.  KUB, 11/08/2021. FINDINGS: Lower chest: Trace bibasilar atelectasis. Hepatobiliary: Normal noncontrast appearance of liver. No gallstones, gallbladder wall thickening, or biliary dilatation. Pancreas: No pancreatic ductal dilatation or surrounding inflammatory changes. Spleen: Normal in size without focal abnormality. Adrenals/Urinary Tract: Adrenal glands are unremarkable. Kidneys are normal, without renal calculi, focal lesion, or hydronephrosis. Stomach/Bowel: Stomach is within normal limits. Transpyloric enteric feeding tube, with tip within the duodenum. Visualized portions of appendix appear normal. No evidence of bowel wall thickening, distention, or inflammatory changes. Vascular/Lymphatic: No enlarged abdominal or pelvic lymph nodes. Other: A ventriculoperitoneal shunt is incompletely imaged, with the distal portion seen coursing along the anterior RIGHT chest, RIGHT abdomen, and tip descending into the pelvis via the RIGHT pericolic gutter. Anterior abdominal wall contusions, likely injection sites. Musculoskeletal: No acute osseous findings. IMPRESSION: 1. No acute abdominal process. 2. Anatomy is amenable to percutaneous gastrostomy placement. Roanna BanningJon  Mugweru, MD [facility] Electronically Signed   By: Roanna BanningJon  Mugweru M.D.   On: 11/18/2021 14:54   ?No results for input(s): WBC, HGB, HCT, PLT in the last 72 hours. ? ?No results for input(s): NA, K, CL, CO2, GLUCOSE, BUN, CREATININE, CALCIUM in the last 72 hours. ? ? ?Intake/Output Summary (Last 24 hours) at 11/19/2021 1226 ?Last data filed at 11/19/2021 [phone] ?Gross per 24 hour  ?Intake --  ?Output 1450 ml  ?Net -1450 ml  ? ?  ? ?  ? ?Physical Exam: ?Vital Signs ?Blood pressure (!) 178/101, pulse 72, temperature 98 ?F (36.7 ?C), resp. rate 20, height 6\' 3"  (1.905 m), weight 128.1 kg, SpO2 100 %. ? ? ? ?General: No acute distress ?Mood and affect are appropriate ?Dysarthric/aphasic but with good comprehension ? ? ?Assessment/Plan: ?1. Functional deficits which require 3+ hours per day of interdisciplinary therapy in a comprehensive inpatient rehab setting. ?Physiatrist is providing close team supervision and 24 hour management of active medical problems listed below. ?Physiatrist and rehab team continue to assess barriers to discharge/monitor patient progress toward functional and medical goals ? ?Care Tool: ? ?Bathing ? Bathing activity did not occur: Safety/medical concerns ?Body parts bathed by patient: Right arm, Chest, Abdomen, Front perineal area, Left arm, Right upper leg, Left upper leg, Right lower leg, Left lower leg, Face, Buttocks  ? Body parts bathed by helper: Buttocks ?  ?  ?Bathing assist Assist Level: Moderate Assistance - Patient 50 - 74% (shower level via sit>stand) ?  ?  ?Upper Body Dressing/Undressing ?Upper body dressing   ?What is the patient wearing?: Pull over shirt ?   ?Upper body assist  Assist Level: Moderate Assistance - Patient 50 - 74% ?   ?Lower Body Dressing/Undressing ?Lower body dressing ? ? ?   ?What is the patient wearing?: Pants ? ?  ? ?Lower body assist Assist for lower body dressing: Maximal Assistance - Patient 25 - 49% ?    ? ?Toileting ?Toileting    ?Toileting assist Assist for toileting: Minimal Assistance - Patient > 75% (standing at toilet) ?  ?  ?Transfers ?Chair/bed transfer ? ?Transfers assist ? Chair/bed transfer activity did not occur: Safety/medical concerns ? ?Chair/bed transfer assist level: Contact Guard/Touching assist ?Chair/bed transfer assistive device: Walker ?  ?Locomotion ?Ambulation ? ? ?Ambulation assist ? ?   ? ?Assist level: Minimal Assistance - Patient > 75% ?Assistive device: Walker-rolling ?Max distance: 217ft  ? ?Walk 10 feet activity ? ? ?Assist ?   ? ?Assist level: Minimal Assistance - Patient > 75% ?Assistive device: Walker-rolling, Orthosis  ? ?Walk 50 feet activity ? ? ?Assist Walk 50 feet with 2 turns activity did not occur: Safety/medical concerns ? ?  ?   ? ? ?Walk 150 feet activity ? ? ?Assist Walk 150 feet activity did not occur: Safety/medical concerns ? ?  ?  ?  ? ?Walk 10 feet on uneven surface  ?activity ? ? ?Assist Walk 10 feet on uneven surfaces activity did not occur: Safety/medical concerns ? ? ?  ?   ? ?Wheelchair ? ? ? ? ?Assist Is the patient using a wheelchair?: Yes ?Type of Wheelchair: Manual ?  ? ?Wheelchair assist level: Minimal Assistance - Patient > 75% ?Max wheelchair distance: 15 ft  ? ? ?Wheelchair 50 feet with 2 turns activity ? ? ? ?Assist ? ?  ?  ? ? ?Assist Level: Total Assistance - Patient < 25%  ? ?Wheelchair 150 feet activity  ? ? ? ?Assist ?   ? ? ?Assist Level: Total Assistance - Patient < 25%  ? ?Blood pressure (!) 178/101, pulse 72, temperature 98 ?F (36.7 ?C), resp. rate 20, height 6\' 3"  (1.905 m), weight 128.1 kg, SpO2 100 %. ? ?Medical Problem List and Plan: ?1. Functional deficits secondary to left basal ganglia ICH ?            -patient may shower ?            -ELOS/Goals: 4/11 supervision with PT,OT and sup/min with SLP ? -Con't CIR- PT, OT and SLP  ?2.  Antithrombotics: ?-DVT/anticoagulation:  Pharmaceutical: Lovenox ?            -antiplatelet therapy: N/A due  to bleed ?3. Pain Management: tylenol prn.  ?4. Mood: LCSW to follow for evaluation and support when appropriate.  ?            -antipsychotic agents: N?A ?5. Neuropsych: This patient is not fully capable of making decisions on his own behalf. ?6. Skin/Wound Care: Routine pressure relief measures.  ?7. Fluids/Electrolytes/Nutrition: Monitor I/O. Check CMET on Monday ?8.  HTN: Monitor BP TID- continue Catapres, Norvasc, Coreg, hydralazine and HCTZ, and spironolactone\ ? -SBP elevated at times but generally improved ? -continue to follow closely ?           ?Vitals:  ? 11/19/21 1210 11/19/21 1215  ?BP: (!) 153/97 (!) 178/101  ?Pulse: 64 72  ?Resp: (!) 22 20  ?Temp:    ?SpO2: 99% 100%  ? ? ?9. Severe dysphagia due to CVA : Continue NPO with tube feeds for nutritional support. ?            -  PEG placement today due to recovery that is likely to occur over several months time ?10. Acute on chronic renal failure: SCr 1.4 at baseline?-->1.78 3/25 ?            --continue to monitor with serial checks.  ?            --added water flushes.  ?  ? ?  Latest Ref Rng & Units 11/16/2021  ?  7:07 AM 11/13/2021  ?  4:09 AM 11/12/2021  ?  2:05 AM  ?BMP  ?Glucose 70 - 99 mg/dL 664   403   474    ?BUN 6 - 20 mg/dL 44   41   43    ?Creatinine 0.61 - 1.24 mg/dL 2.59   5.63   8.75    ?Sodium 135 - 145 mmol/L 138   140   141    ?Potassium 3.5 - 5.1 mmol/L 3.7   3.9   3.8    ?Chloride 98 - 111 mmol/L 102   103   100    ?CO2 22 - 32 mmol/L 26   27   32    ?Calcium 8.9 - 10.3 mg/dL 9.2   9.1   9.2    ? Improving may be at baseline  ?11. Polycythemia: Hgb 19.4, JAK2 genetic testing still pending.  ? 3/27- Hb 17.0- con't to monitor ?12. Urinary retention: continue foley ?            -consider voiding trial beginning of week depending upon how well he moves.  ?  ?13. 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 Flash pulmonary edema/acute respiratory  failure: Weaned off BIPAP by 03/22 ?            --Monitor daily wts  down from 141-->137 kg. ?Cardiac MRI showed normal RIght and Left heart function  ?16.  Metatarsalgia RIght foot will add voltaren gel and foo

## 2021-11-19 NOTE — Progress Notes (Signed)
Speech Language Pathology Daily Session Note ? ?Patient Details  ?Name: Paul [name] ?MRN: [id number] ?Date of Birth: Dec 28, 1985 ? ?Today's Date: 11/19/2021 ?SLP Individual Time: 6789-3810 ?SLP Individual Time Calculation (min): 58 min ? ?Short Term Goals: ?Week 1: SLP Short Term Goal 1 (Week 1): Patient will consume ice chips/thin liquid trials with min overt s/sx of aspiration to indicate readiness for MBS. ?SLP Short Term Goal 2 (Week 1): Patient will produce phonemes (/a/, /m/) with appropriate voicing in 80% of opportunities given max verbal/visual cues. ?SLP Short Term Goal 3 (Week 1): Patient will demonstrate use of diaphragmatic breathing with minA. ?SLP Short Term Goal 4 (Week 1): Pt will complete effortful swallows during ice chip/water trials to increase pharyngeal strength with minA multimodal cues. ? ?Skilled Therapeutic Interventions: Skilled ST treatment focused on swallowing and communication goals. IV team arrived at beginning of session for IV placement in preparation of PEG tube procedure to take place sometime today. SLP facilitated communication exchange and offered clarifying information on pt's behalf. Pt communicated primarily through gestures and head nods and occasionally through text on his phone. SLP facilitated session by providing mod A for phoneme production of voiced /m/ and /a/ with mod A and extra time to obtain and coordinate adequate breath support with phoneme production; required mod A for execution of diaphragmatic breathing. Quality of speech appeared affected by hypernasality and nasal emission. Pt produced /p/ with min A cues to initiate and improved efficiency as compared to previous sessions. Pt exhibited decreased oral containment of secretions with difficulty initiating swallow, therefore seen utilizing suction more frequently. Patient performed oral care with set-up A with suction toothbrush and min A for thoroughness. Patient was left in recliner with alarm activated  and immediate needs within reach at end of session. Continue per current plan of care.   ?   ?Pain ?Pain Assessment ?Pain Scale: 0-10 ?Pain Score: 0-No pain ? ?Therapy/Group: Individual Therapy ? ?Eryx Zane T Flynn Lininger ?11/19/2021, 8:28 AM ?

## 2021-11-19 NOTE — Progress Notes (Signed)
Physical Therapy Session Note ? ?Patient Details  ?Name: Paul [name] ?MRN: [id number] ?Date of Birth: [birth year] ? ?Today's Date: 11/19/2021 ?PT Individual Time: 1350-1500 ?PT Individual Time Calculation (min): 70 min  ? ?Short Term Goals: ?Week 1:  PT Short Term Goal 1 (Week 1): Patient will perform basic transfers with CGA consistently. ?PT Short Term Goal 2 (Week 1): Patient will ambulate >100 feet using LRAD with min A. ?PT Short Term Goal 3 (Week 1): Patient will improve Berg Balance Scale by at least 7 points to meet MCID for improved postural control and reduced fall risk. ? ?Skilled Therapeutic Interventions/Progress Updates:  ? ?Pt received supine in bed and agreeable to PT. Supine>sit transfer with CGA for use of UE to push to sitting.  ? ?Stand pivot transfers throughout session with CGA-min assist with RW and cues for gait pattern to prevent posterior LOB with step to [name].  ? ?Gait training with RW x 1107f with min assist and cues for increased step length on the RLE intermittently as well as tactile cues for imrpvoed posture and pelvic rotation on the R side.  ? ?Dynamic balance training while engaged in Wii bowling sitting x 2 frames, stnaidng on level surface x 4 frames and standing on airex pad x 4 frames with min assist for management on airex pad and cues for use of ankle strategy to prevent posterior LOB   ? ?Kinetron reciprocal movement training  1 min x 5 with therapeutic rest break between bout. Cues for full ROM and hand over hand assist to force use of RUE on handle to improved trunk activation.  ? ?Pt returned to room and performed stand pivot transfer to bed with RW and CGA. Sit>supine completed with supervision assist and left supine in bed with call bell in reach and all needs met.   ? ? ?   ? ?Therapy Documentation ?Precautions:  ?Precautions ?Precautions: Fall ?Precaution Comments: right hemi, mild right neglect, cortrak, urinary catheter, BP<160 ?Restrictions ?Weight Bearing  Restrictions: No ? ?Vital Signs: ?Therapy Vitals ?Temp: 98.6 ?F (37 ?C) ?Temp Source: Oral ?Pulse Rate: 94 ?Resp: 16 ?BP: (!) 167/103 ?Patient Position (if appropriate): Lying ?Oxygen Therapy ?SpO2: (!) 62 % ?Pain: ?denies ? ? ? ?Therapy/Group: Individual Therapy ? ?ALorie Phenix?11/19/2021, 6:00 PM  ?

## 2021-11-19 NOTE — Procedures (Signed)
Interventional Radiology Procedure Note  Procedure: Percutaneous gastrostomy tube placement  Complications: None  Estimated Blood Loss: < 10 mL  Findings: 20 Fr bumper retention gastrostomy tube placed with tip in body of stomach. OK to use in 24 hours.  Anias Bartol T. Jilda Kress, M.D Pager:  [phone]   

## 2021-11-20 LAB — GLUCOSE, CAPILLARY
Glucose-Capillary: 112 mg/dL — ABNORMAL HIGH (ref 70–99)
Glucose-Capillary: 115 mg/dL — ABNORMAL HIGH (ref 70–99)
Glucose-Capillary: 121 mg/dL — ABNORMAL HIGH (ref 70–99)
Glucose-Capillary: 127 mg/dL — ABNORMAL HIGH (ref 70–99)
Glucose-Capillary: 129 mg/dL — ABNORMAL HIGH (ref 70–99)
Glucose-Capillary: 134 mg/dL — ABNORMAL HIGH (ref 70–99)

## 2021-11-20 NOTE — Progress Notes (Signed)
Speech Language Pathology Daily Session Note ? ?Patient Details  ?Name: Paul [name] ?MRN: [id number] ?Date of Birth: Dec 10, 1985 ? ?Today's Date: 11/20/2021 ?SLP Individual Time: [id number] ?SLP Individual Time Calculation (min): 29 min ? ?Short Term Goals: ?Week 1: SLP Short Term Goal 1 (Week 1): Patient will consume ice chips/thin liquid trials with min overt s/sx of aspiration to indicate readiness for MBS. ?SLP Short Term Goal 2 (Week 1): Patient will produce phonemes (/a/, /m/) with appropriate voicing in 80% of opportunities given max verbal/visual cues. ?SLP Short Term Goal 3 (Week 1): Patient will demonstrate use of diaphragmatic breathing with minA. ?SLP Short Term Goal 4 (Week 1): Pt will complete effortful swallows during ice chip/water trials to increase pharyngeal strength with minA multimodal cues. ? ?Skilled Therapeutic Interventions: ?Pt seen for skilled ST with focus on swallowing goals, pt sitting upright in recliner and agreeable to therapeutic tasks. Pt completing oral care via suction toothbrush with Setup A and Supervision A for thoroughness. Pt preferring thin liquid trials vs ice chips this date, consuming tsp thin water x8 with significant overt s/s aspiration on 6/8 trials (large coughing/choking episodes). Pt continues with delayed A-P transit and swallow initiation, requiring 2-3 swallows per bolus despite 1/2 tsp size. Pt does appear to be aware of delay and need for multiple swallows, often holding is finger up to indicate to give him more time. Pt typing on phone "I am supposed to get my tube out today" regarding Cortrak, discussed with nurse. Pt left in recliner with alarm set and all needs within reach. Cont ST POC.  ? ?Pain ?Pain Assessment ?Pain Scale: 0-10 ?Pain Score: 0-No pain ? ?Therapy/Group: Individual Therapy ? ?Dewaine Conger ?11/20/2021, 12:06 PM ?

## 2021-11-20 NOTE — Progress Notes (Addendum)
Occupational Therapy Session Note ? ?Patient Details  ?Name: Paul [name] ?MRN: [id number] ?Date of Birth: Dec 09, 1985 ? ?Today's Date: 11/20/2021 ?OT Individual Time: 0092-3300 ?OT Individual Time Calculation (min): 36 min  ? ? ?Short Term Goals: ?Week 1:  OT Short Term Goal 1 (Week 1): Pt will complete UB dressing with supervision ?OT Short Term Goal 2 (Week 1): Pt will complete toileting with min assist ?OT Short Term Goal 3 (Week 1): Pt will complete LB dressing with min assist. ?OT Short Term Goal 4 (Week 1): Pt will demonstrate functional use of RUE as stabilizer with mod cueing to attend. ? ?Skilled Therapeutic Interventions/Progress Updates:  ?  Pt received seated in recliner, agreeable to  unscheduled therapy. Session focus on self-care retraining, activity tolerance, RUE NMR in prep for improved ADL/IADL/func mobility performance + decreased caregiver burden. Pt doffed shirt min A to pull over head, donned new shirt with min A to thread RUE. LPN present to disconnect tube feeds. Short ambulatory transfer with CGA , min A to place R hand in saddle splint of RW.  ? ? ?Stood at high low table with CGA for balance, and completed 2x15 of the following for improved motor return of RUE: modified push-ups, forward towel slides. Issued soft tan theraputty, pt return demonstration of putty squeezes, rolling into log shape, and pulling apart. Required assist from L hand to complete therex.  ? ? ?Pt left with HOB over 30 degrees in bed with bed alarm engaged, call bell in reach, and all immediate needs met.  ? ? ?Therapy Documentation ?Precautions:  ?Precautions ?Precautions: Fall ?Precaution Comments: right hemi, mild right neglect, cortrak, urinary catheter, BP<160 ?Restrictions ?Weight Bearing Restrictions: No ? ?Pain: gestures to G tube site, indicates discomfort but declining pain rx/ intervention at this time ?ADL: See Care Tool for more details. ? ? ? ?Therapy/Group: Individual Therapy ? ?Volanda Napoleon MS,  OTR/L ? ?11/20/2021, 2:46 PM ?

## 2021-11-20 NOTE — Progress Notes (Signed)
? ? ?Referring Physician(s): ?Dr.Andrew Kirsteins ? ?Supervising Physician: Richarda OverlieHenn, Adam ? ?[facility]  [facility] ? ?Chief Complaint: ? ?Dysphagia ? ?Brief History: ? ?Paul [name] is a 36 y.o. male with complicated hx of prior TBI with hematoma and VP shunt placement. ? ?Recently admitted on 3/18 with hemorrhagic stroke. ? ?Currently in Inpt Rehab but having significant dysphagia. ? ?Paul [name] underwent placement of a gastrostomy tube yesterday by Dr. Fredia SorrowYamagata. ? ?Subjective: ? ?Sitting up in chair. Eager to get NGT removed. ? ?Allergies: ?Patient has no known allergies. ? ?Medications: ?Prior to Admission medications   ?Medication Sig Start Date End Date Taking? Authorizing Provider  ?amLODipine (NORVASC) 10 MG tablet Place 1 tablet (10 mg total) into feeding tube daily. 11/14/21 12/14/21  Zigmund DanielPowell, A Caldwell Jr., MD  ?carvedilol (COREG) 25 MG tablet Place 1 tablet (25 mg total) into feeding tube 2 (two) times daily with a meal. 11/13/21 12/13/21  Zigmund DanielPowell, A Caldwell Jr., MD  ?cloNIDine (CATAPRES) 0.2 MG tablet Place 1 tablet (0.2 mg total) into feeding tube 3 (three) times daily. 11/13/21 12/13/21  Zigmund DanielPowell, A Caldwell Jr., MD  ?hydrALAZINE (APRESOLINE) 50 MG tablet Place 1 tablet (50 mg total) into feeding tube every 8 (eight) hours. 11/13/21 12/13/21  Zigmund DanielPowell, A Caldwell Jr., MD  ?hydrochlorothiazide (HYDRODIURIL) 25 MG tablet Place 1 tablet (25 mg total) into feeding tube daily. 11/14/21 12/14/21  Zigmund DanielPowell, A Caldwell Jr., MD  ?Menthol, Topical Analgesic, (ICY HOT BACK EX) Apply 1 application. topically daily as needed (back pain).    [provider]  ?Nutritional Supplements (FEEDING SUPPLEMENT, OSMOLITE 1.5 CAL,) LIQD Place 1,000 mLs into feeding tube continuous. At 60 ml/hr 11/13/21   Zigmund DanielPowell, A Caldwell Jr., MD  ?Nutritional Supplements (FEEDING SUPPLEMENT, PROSOURCE TF,) liquid Place 45 mLs into feeding tube 3 (three) times daily. 11/13/21   Zigmund DanielPowell, A Caldwell Jr., MD  ?pantoprazole sodium (PROTONIX) 40 mg Place  40 mg into feeding tube daily. 11/13/21 12/13/21  Zigmund DanielPowell, A Caldwell Jr., MD  ?rosuvastatin (CRESTOR) 20 MG tablet Place 1 tablet (20 mg total) into feeding tube daily. 11/14/21 12/14/21  Zigmund DanielPowell, A Caldwell Jr., MD  ?spironolactone (ALDACTONE) 50 MG tablet Place 1 tablet (50 mg total) into feeding tube daily. 11/14/21 12/14/21  Zigmund DanielPowell, A Caldwell Jr., MD  ? ? ? ?Vital Signs: ?BP 139/89 (BP Location: Right Arm)   Pulse 67   Temp 98.9 ?F (37.2 ?C) (Oral)   Resp 20   Ht 6\' 3"  (1.905 m)   Wt 283 lb 11.7 oz (128.7 kg)   SpO2 96%   BMI 35.46 kg/m?  ? ?Physical Exam ?Vitals reviewed.  ?Cardiovascular:  ?   Rate and Rhythm: Normal rate.  ?Abdominal:  ?   Comments: Gtube in place, site looks good. No bleeding, no issues.  ?Skin: ?   General: Skin is warm.  ?Neurological:  ?   Mental Status: Paul [name] is alert. Mental status is at baseline.  ? ? ?Imaging: ?CT ABDOMEN WO CONTRAST ? ?Result Date: 11/18/2021 ?CLINICAL DATA:  anatomy eval for G tube placement EXAM: CT ABDOMEN WITHOUT CONTRAST TECHNIQUE: Multidetector CT imaging of the abdomen was performed following the standard protocol without IV contrast. RADIATION DOSE REDUCTION: This exam was performed according to the departmental dose-optimization program which includes automated exposure control, adjustment of the mA and/or kV according to patient size and/or use of iterative reconstruction technique. COMPARISON:  Chest XR, 11/11/2021.  KUB, 11/08/2021. FINDINGS: Lower chest: Trace bibasilar atelectasis. Hepatobiliary: Normal noncontrast appearance of liver. No gallstones,  gallbladder wall thickening, or biliary dilatation. Pancreas: No pancreatic ductal dilatation or surrounding inflammatory changes. Spleen: Normal in size without focal abnormality. Adrenals/Urinary Tract: Adrenal glands are unremarkable. Kidneys are normal, without renal calculi, focal lesion, or hydronephrosis. Stomach/Bowel: Stomach is within normal limits. Transpyloric enteric feeding tube, with tip within  the duodenum. Visualized portions of appendix appear normal. No evidence of bowel wall thickening, distention, or inflammatory changes. Vascular/Lymphatic: No enlarged abdominal or pelvic lymph nodes. Other: A ventriculoperitoneal shunt is incompletely imaged, with the distal portion seen coursing along the anterior RIGHT chest, RIGHT abdomen, and tip descending into the pelvis via the RIGHT pericolic gutter. Anterior abdominal wall contusions, likely injection sites. Musculoskeletal: No acute osseous findings. IMPRESSION: 1. No acute abdominal process. 2. Anatomy is amenable to percutaneous gastrostomy placement. Paul Banning, MD [facility] Electronically Signed   By: Paul [name] M.D.   On: 11/18/2021 14:54  ? ?DG Skull 1-3 Views ? ?Result Date: 11/16/2021 ?CLINICAL DATA:  Ventricular shunt placement EXAM: SKULL - 1-3 VIEW COMPARISON:  No prior skull radiograph, correlation is made with 11/11/2021 CTA head neck FINDINGS: Prior right cranioplasties. Left frontal approach ventriculostomy catheter. Additional shunt catheter enters through a left inferior parietal burr hole. Shunt catheter tubing is seen more inferiorly, in the left neck, but no radiopaque tubing appears to connect the intracranial shunt catheters with the neck tubing. A nasogastric tube is noted. Hypoplastic right frontal sinus. The remaining sinuses are patent. IMPRESSION: Right frontal and left inferior parietal approach ventriculostomy catheters, without radiopaque tubing connecting these to the shunt tubing in the left neck. Correlate with expected catheter tubing locations. Electronically Signed   By: Wiliam Ke M.D.   On: 11/16/2021 16:58  ? ?MR BRAIN WO CONTRAST ? ?Result Date: 11/17/2021 ?CLINICAL DATA:  Stroke follow-up. EXAM: MRI HEAD WITHOUT CONTRAST TECHNIQUE: Multiplanar, multiecho pulse sequences of the brain and surrounding structures were obtained without intravenous  contrast. COMPARISON:  Head and neck CTA 11/11/2021 FINDINGS: Brain: A 2.2 x 1.9 x 2.5 cm hemorrhage centered in the left putamen with mild surrounding edema is unchanged. A left frontal approach ventriculostomy catheter is again noted terminating in the frontal horn of the right lateral ventricle, and there is gliosis along the catheter in the left frontal lobe. There is prominent susceptibility artifact from the external portion of the shunt which obscures portions of the left frontal lobe, particularly on diffusion weighted and susceptibility weighted imaging limiting assessment for acute infarct. Within this limitation, no sizable acute infarct is identified separate from the left basal ganglia hemorrhage. An additional catheter is again noted coursing over the lateral left cerebral convexity in terminating in the left middle cranial fossa. There are numerous chronic microhemorrhages in the cerebrum (particularly right frontotemporal region) and cerebellum. The ventricles are normal in size. Chronic infarcts are noted in the inferior right cerebellar hemisphere and left medulla. Chronic lacunar infarcts versus dilated perivascular spaces are noted in the basal ganglia bilaterally. There is no extra-axial fluid collection. Vascular: Major intracranial vascular flow voids are preserved. Skull and upper cervical spine: Bilateral craniotomies. Sinuses/Orbits: Unremarkable orbits. Mild mucosal thickening in the paranasal sinuses. Small left mastoid effusion. Other: None. IMPRESSION: 1. Unchanged 2.5 cm left basal ganglia hemorrhage with mild surrounding edema. 2. Chronic microhemorrhages in the cerebrum and cerebellum which may be related to the patient's history of hypertension and traumatic brain injury. 3. 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 Electronically Signed   By: Jolaine Click.D.  On: 11/17/2021 08:59  ? ?IR GASTROSTOMY TUBE MOD SED ? ?Result Date: 11/19/2021 ?CLINICAL DATA:  Hemorrhagic  stroke, dysphagia and need for percutaneous gastrostomy tube for chronic nutrition. EXAM: PERCUTANEOUS GASTROSTOMY TUBE PLACEMENT ANESTHESIA/SEDATION: Moderate (conscious) sedation was employed during this procedure.

## 2021-11-20 NOTE — Progress Notes (Signed)
?                                                       PROGRESS NOTE ? ? ?Subjective/Complaints: ? ?Pt does very well with gestural communication  ?Some pain at G tube site  ? ?ROS: limited due to cognition/ aphasia ?Objective: ?  ?CT ABDOMEN WO CONTRAST ? ?Result Date: 11/18/2021 ?CLINICAL DATA:  anatomy eval for G tube placement EXAM: CT ABDOMEN WITHOUT CONTRAST TECHNIQUE: Multidetector CT imaging of the abdomen was performed following the standard protocol without IV contrast. RADIATION DOSE REDUCTION: This exam was performed according to the departmental dose-optimization program which includes automated exposure control, adjustment of the mA and/or kV according to patient size and/or use of iterative reconstruction technique. COMPARISON:  Chest XR, 11/11/2021.  KUB, 11/08/2021. FINDINGS: Lower chest: Trace bibasilar atelectasis. Hepatobiliary: Normal noncontrast appearance of liver. No gallstones, gallbladder wall thickening, or biliary dilatation. Pancreas: No pancreatic ductal dilatation or surrounding inflammatory changes. Spleen: Normal in size without focal abnormality. Adrenals/Urinary Tract: Adrenal glands are unremarkable. Kidneys are normal, without renal calculi, focal lesion, or hydronephrosis. Stomach/Bowel: Stomach is within normal limits. Transpyloric enteric feeding tube, with tip within the duodenum. Visualized portions of appendix appear normal. No evidence of bowel wall thickening, distention, or inflammatory changes. Vascular/Lymphatic: No enlarged abdominal or pelvic lymph nodes. Other: A ventriculoperitoneal shunt is incompletely imaged, with the distal portion seen coursing along the anterior RIGHT chest, RIGHT abdomen, and tip descending into the pelvis via the RIGHT pericolic gutter. Anterior abdominal wall contusions, likely injection sites. Musculoskeletal: No acute osseous findings. IMPRESSION: 1. No acute abdominal process. 2. Anatomy is amenable to percutaneous gastrostomy  placement. Michaelle Birks, MD [facility] Radiology Electronically Signed   By: Michaelle Birks M.D.   On: 11/18/2021 14:54  ? ?IR GASTROSTOMY TUBE MOD SED ? ?Result Date: 11/19/2021 ?CLINICAL DATA:  Hemorrhagic stroke, dysphagia and need for percutaneous gastrostomy tube for chronic nutrition. EXAM: PERCUTANEOUS GASTROSTOMY TUBE PLACEMENT ANESTHESIA/SEDATION: Moderate (conscious) sedation was employed during this procedure. A total of Versed 2.0 mg and Fentanyl 75 mcg was administered intravenously. Moderate Sedation Time: 12 minutes. The patient's level of consciousness and vital signs were monitored continuously by radiology nursing throughout the procedure under my direct supervision. CONTRAST:  20 mL Omnipaque 300 MEDICATIONS: 2 g IV Ancef. IV antibiotic was administered in an appropriate time interval prior to needle puncture of the skin. FLUOROSCOPY TIME:  54 seconds.  6.0 mGy. PROCEDURE: The procedure, risks, benefits, and alternatives were explained to the patient's mother. Questions regarding the procedure were encouraged and answered. The patient's mother understands and consents to the procedure. A time-out was performed prior to initiating the procedure. A 5-French catheter was then advanced through the patient's mouth under fluoroscopy into the esophagus and to the level of the stomach. This catheter was used to insufflate the stomach with air under fluoroscopy. The abdominal wall was prepped with chlorhexidine in a sterile fashion, and a sterile drape was applied covering the operative field. A sterile gown and sterile gloves were used for the procedure. Local anesthesia was provided with 1% Lidocaine. A skin incision was made in the upper abdominal wall. Under fluoroscopy, an 18 gauge trocar needle was advanced into the stomach. Contrast injection was performed to confirm intraluminal position of the needle tip. A  single T tack was then deployed in the lumen  of the stomach. This was brought up to tension at the skin surface. Over a guidewire, a 9-French sheath was advanced into the lumen of the stomach. The wire was left in place as a safety wire. A loop snare device from a percutaneous gastrostomy kit was then advanced into the stomach. A floppy guide wire was advanced through the orogastric catheter under fluoroscopy in the stomach. The loop snare was advanced through the percutaneous gastric access was used to snare the guide wire. This allowed withdrawal of the loop snare out of the patient's mouth by retraction of the orogastric catheter and wire. A 20-French bumper retention gastrostomy tube was looped around the snare device. It was then pulled back through the patient's mouth. The retention bumper was brought up to the anterior gastric wall. The T tack suture was cut at the skin. The exiting gastrostomy tube was cut to appropriate length and a feeding adapter applied. The catheter was injected with contrast material to confirm position and a fluoroscopic spot image saved. The tube was then flushed with saline. A dressing was applied over the gastrostomy exit site. COMPLICATIONS: None. FINDINGS: The stomach distended well with air allowing safe placement of the gastrostomy tube. After placement, the tip of the gastrostomy tube lies in the body of the stomach. IMPRESSION: Percutaneous gastrostomy with placement of a 20-French bumper retention tube in the body of the stomach. This tube can be used for percutaneous feeds beginning in 24 hours after placement. Electronically Signed   By: Aletta Edouard M.D.   On: 11/19/2021 13:31   ?No results for input(s): WBC, HGB, HCT, PLT in the last 72 hours. ? ?No results for input(s): NA, K, CL, CO2, GLUCOSE, BUN, CREATININE, CALCIUM in the last 72 hours. ? ? ?Intake/Output Summary (Last 24 hours) at 11/20/2021 1156 ?Last data filed at 11/20/2021 0802 ?Gross per 24 hour  ?Intake 100 ml  ?Output 1250 ml  ?Net -1150 ml  ? ?  ? ?   ? ?Physical Exam: ?Vital Signs ?Blood pressure 139/89, pulse 67, temperature 98.9 ?F (37.2 ?C), temperature source Oral, resp. rate 20, height $RemoveBe'6\' 3"'chMlAIByS$  (1.905 m), weight 128.7 kg, SpO2 96 %. ? ? ?General: No acute distress ?Mood and affect are appropriate ?Heart: Regular rate and rhythm no rubs murmurs or extra sounds ?Lungs: Clear to auscultation, breathing unlabored, no rales or wheezes ?Abdomen: Positive bowel sounds, soft nontender to palpation, nondistended ?Extremities: No clubbing, cyanosis, or edema ?Skin: No evidence of breakdown, no evidence of rash ?Neurologic: Cranial nerves II through XII intact, motor strength is 5/5 in left 4/5 RIght deltoid, bicep, tricep, grip, hip flexor, knee extensors, ankle dorsiflexor and plantar flexor ?Sensory exam normal sensation to light touch and proprioception in bilateral upper and lower extremities ?Cerebellar exam normal finger to nose to finger as well as heel to shin in bilateral upper and lower extremities ?Musculoskeletal: Full range of motion in all 4 extremities. No joint swelling ? ? ? ?Assessment/Plan: ?1. Functional deficits which require 3+ hours per day of interdisciplinary therapy in a comprehensive inpatient rehab setting. ?Physiatrist is providing close team supervision and 24 hour management of active medical problems listed below. ?Physiatrist and rehab team continue to assess barriers to discharge/monitor patient progress toward functional and medical goals ? ?Care Tool: ? ?Bathing ? Bathing activity did not occur: Safety/medical concerns ?Body parts bathed by patient: Right arm, Chest, Abdomen, Front perineal area, Left arm, Right upper leg, Left upper  leg, Right lower leg, Left lower leg, Face, Buttocks  ? Body parts bathed by helper: Buttocks ?  ?  ?Bathing assist Assist Level: Moderate Assistance - Patient 50 - 74% (shower level via sit>stand) ?  ?  ?Upper Body Dressing/Undressing ?Upper body dressing   ?What is the patient wearing?: Pull over  shirt ?   ?Upper body assist Assist Level: Moderate Assistance - Patient 50 - 74% ?   ?Lower Body Dressing/Undressing ?Lower body dressing ? ? ?   ?What is the patient wearing?: Pants ? ?  ? ?Lower body assist [place]

## 2021-11-21 LAB — GLUCOSE, CAPILLARY
Glucose-Capillary: 111 mg/dL — ABNORMAL HIGH (ref 70–99)
Glucose-Capillary: 118 mg/dL — ABNORMAL HIGH (ref 70–99)
Glucose-Capillary: 122 mg/dL — ABNORMAL HIGH (ref 70–99)
Glucose-Capillary: 133 mg/dL — ABNORMAL HIGH (ref 70–99)
Glucose-Capillary: 138 mg/dL — ABNORMAL HIGH (ref 70–99)
Glucose-Capillary: 156 mg/dL — ABNORMAL HIGH (ref 70–99)
Glucose-Capillary: 91 mg/dL (ref 70–99)

## 2021-11-21 NOTE — Progress Notes (Signed)
?                                                       PROGRESS NOTE ? ? ?Subjective/Complaints: ? ?Tolerating TF at goal rate, abd pain improving, wants to get off pump ASAP  ? ?ROS: limited due to aphasia ?Objective: ?  ?IR GASTROSTOMY TUBE MOD SED ? ?Result Date: 11/19/2021 ?CLINICAL DATA:  Hemorrhagic stroke, dysphagia and need for percutaneous gastrostomy tube for chronic nutrition. EXAM: PERCUTANEOUS GASTROSTOMY TUBE PLACEMENT ANESTHESIA/SEDATION: Moderate (conscious) sedation was employed during this procedure. A total of Versed 2.0 mg and Fentanyl 75 mcg was administered intravenously. Moderate Sedation Time: 12 minutes. The patient's level of consciousness and vital signs were monitored continuously by radiology nursing throughout the procedure under my direct supervision. CONTRAST:  20 mL Omnipaque 300 MEDICATIONS: 2 g IV Ancef. IV antibiotic was administered in an appropriate time interval prior to needle puncture of the skin. FLUOROSCOPY TIME:  54 seconds.  6.0 mGy. PROCEDURE: The procedure, risks, benefits, and alternatives were explained to the patient's mother. Questions regarding the procedure were encouraged and answered. The patient's mother understands and consents to the procedure. A time-out was performed prior to initiating the procedure. A 5-French catheter was then advanced through the patient's mouth under fluoroscopy into the esophagus and to the level of the stomach. This catheter was used to insufflate the stomach with air under fluoroscopy. The abdominal wall was prepped with chlorhexidine in a sterile fashion, and a sterile drape was applied covering the operative field. A sterile gown and sterile gloves were used for the procedure. Local anesthesia was provided with 1% Lidocaine. A skin incision was made in the upper abdominal wall. Under fluoroscopy, an 18 gauge trocar needle was advanced into the stomach. Contrast injection was performed to confirm intraluminal position of the  needle tip. A single T tack was then deployed in the lumen of the stomach. This was brought up to tension at the skin surface. Over a guidewire, a 9-French sheath was advanced into the lumen of the stomach. The wire was left in place as a safety wire. A loop snare device from a percutaneous gastrostomy kit was then advanced into the stomach. A floppy guide wire was advanced through the orogastric catheter under fluoroscopy in the stomach. The loop snare was advanced through the percutaneous gastric access was used to snare the guide wire. This allowed withdrawal of the loop snare out of the patient's mouth by retraction of the orogastric catheter and wire. A 20-French bumper retention gastrostomy tube was looped around the snare device. It was then pulled back through the patient's mouth. The retention bumper was brought up to the anterior gastric wall. The T tack suture was cut at the skin. The exiting gastrostomy tube was cut to appropriate length and a feeding adapter applied. The catheter was injected with contrast material to confirm position and a fluoroscopic spot image saved. The tube was then flushed with saline. A dressing was applied over the gastrostomy exit site. COMPLICATIONS: None. FINDINGS: The stomach distended well with air allowing safe placement of the gastrostomy tube. After placement, the tip of the gastrostomy tube lies in the body of the stomach. IMPRESSION: Percutaneous gastrostomy with placement of a 20-French bumper retention tube in the body of the stomach. This tube can be used for percutaneous  feeds beginning in 24 hours after placement. Electronically Signed   By: Aletta Edouard M.D.   On: 11/19/2021 13:31   ?No results for input(s): WBC, HGB, HCT, PLT in the last 72 hours. ? ?No results for input(s): NA, K, CL, CO2, GLUCOSE, BUN, CREATININE, CALCIUM in the last 72 hours. ? ? ?Intake/Output Summary (Last 24 hours) at 11/21/2021 1123 ?Last data filed at 11/21/2021 0700 ?Gross per 24 hour   ?Intake 0 ml  ?Output 1425 ml  ?Net -1425 ml  ? ?  ? ?  ? ?Physical Exam: ?Vital Signs ?Blood pressure 140/80, pulse 67, temperature 98 ?F (36.7 ?C), temperature source Oral, resp. rate 18, height _0  (1.905 m), weight 129.1 kg, SpO2 97 %. ? ? ?General: No acute distress ?Mood and affect are appropriate ?Heart: Regular rate and rhythm no rubs murmurs or extra sounds ?Lungs: Clear to auscultation, breathing unlabored, no rales or wheezes ?Abdomen: Positive bowel sounds, soft nontender to palpation, nondistended ?G tube site CDI ?Extremities: No clubbing, cyanosis, or edema ?Skin: No evidence of breakdown, no evidence of rash ?Neurologic: Cranial nerves II through XII intact, motor strength is 5/5 in left 4/5 RIght deltoid, bicep, tricep, grip, hip flexor, knee extensors, ankle dorsiflexor and plantar flexor ?Sensory exam normal sensation to light touch and proprioception in bilateral upper and lower extremities ?Cerebellar exam normal finger to nose to finger as well as heel to shin in bilateral upper and lower extremities ?Musculoskeletal: Full range of motion in all 4 extremities. No joint swelling ? ? ? ?Assessment/Plan: ?1. Functional deficits which require 3+ hours per day of interdisciplinary therapy in a comprehensive inpatient rehab setting. ?Physiatrist is providing close team supervision and 24 hour management of active medical problems listed below. ?Physiatrist and rehab team continue to assess barriers to discharge/monitor patient progress toward functional and medical goals ? ?Care Tool: ? ?Bathing ? Bathing activity did not occur: Safety/medical concerns ?Body parts bathed by patient: Right arm, Chest, Abdomen, Front perineal area, Left arm, Right upper leg, Left upper leg, Right lower leg, Left lower leg, Face, Buttocks  ? Body parts bathed by helper: Buttocks ?  ?  ?Bathing assist Assist Level: Moderate Assistance - Patient 50 - 74% (shower level via sit>stand) ?  ?  ?Upper Body  Dressing/Undressing ?Upper body dressing   ?What is the patient wearing?: Pull over shirt ?   ?Upper body assist Assist Level: Moderate Assistance - Patient 50 - 74% ?   ?Lower Body Dressing/Undressing ?Lower body dressing ? ? ?   ?What is the patient wearing?: Pants ? ?  ? ?Lower body assist Assist for lower body dressing: Maximal Assistance - Patient 25 - 49% ?   ? ?Toileting ?Toileting    ?Toileting assist Assist for toileting: Minimal Assistance - Patient > 75% (standing at toilet) ?  ?  ?Transfers ?Chair/bed transfer ? ?Transfers assist ? Chair/bed transfer activity did not occur: Safety/medical concerns ? ?Chair/bed transfer assist level: Contact Guard/Touching assist ?Chair/bed transfer assistive device: Walker ?  ?Locomotion ?Ambulation ? ? ?Ambulation assist ? ?   ? ?Assist level: Minimal Assistance - Patient > 75% ?Assistive device: Walker-rolling ?Max distance: 241f  ? ?Walk 10 feet activity ? ? ?Assist ?   ? ?Assist level: Minimal Assistance - Patient > 75% ?Assistive device: Walker-rolling, Orthosis  ? ?Walk 50 feet activity ? ? ?Assist Walk 50 feet with 2 turns activity did not occur: Safety/medical concerns ? ?  ?   ? ? ?Walk 150 feet activity ? ? ?  Assist Walk 150 feet activity did not occur: Safety/medical concerns ? ?  ?  ?  ? ?Walk 10 feet on uneven surface  ?activity ? ? ?Assist Walk 10 feet on uneven surfaces activity did not occur: Safety/medical concerns ? ? ?  ?   ? ?Wheelchair ? ? ? ? ?Assist Is the patient using a wheelchair?: Yes ?Type of Wheelchair: Manual ?  ? ?Wheelchair assist level: Minimal Assistance - Patient > 75% ?Max wheelchair distance: 15 ft  ? ? ?Wheelchair 50 feet with 2 turns activity ? ? ? ?Assist ? ?  ?  ? ? ?Assist Level: Total Assistance - Patient < 25%  ? ?Wheelchair 150 feet activity  ? ? ? ?Assist ?   ? ? ?Assist Level: Total Assistance - Patient < 25%  ? ?Blood pressure 140/80, pulse 67, temperature 98 ?F (36.7 ?C), temperature source Oral, resp. rate 18, height 6'  3" (1.905 m), weight 129.1 kg, SpO2 97 %. ? ?Medical Problem List and Plan: ?1. Functional deficits secondary to left basal ganglia ICH ?            -patient may shower ?            -ELOS/Goals: 4/7 supervision with PT,OT and

## 2021-11-21 NOTE — Progress Notes (Signed)
Physical Therapy Session Note ? ?Patient Details  ?Name: Paul [name] ?MRN: [id number] ?Date of Birth: 1986/01/16 ? ?Today's Date: 11/21/2021 ?PT Individual Time: 3762-8315 ?PT Individual Time Calculation (min): 60 min  ? ?Short Term Goals: ?Week 1:  PT Short Term Goal 1 (Week 1): Patient will perform basic transfers with CGA consistently. ?PT Short Term Goal 2 (Week 1): Patient will ambulate >100 feet using LRAD with min A. ?PT Short Term Goal 3 (Week 1): Patient will improve Berg Balance Scale by at least 7 points to meet MCID for improved postural control and reduced fall risk. ? ?Skilled Therapeutic Interventions/Progress Updates:  ?   ?Patient in bed with LPN in the room providing morning medications upon PT arrival. Patient alert and agreeable to PT session. Patient denied pain during session. Utilized hand signals for yes/no answers from patient throughout session. Patient indicated he wanted to wash-up and get dressed this morning.  ? ?Focused session on dynamic sitting and standing balance during ADLs, gait training, and lower extremity motor control with functional activities. ? ?Therapeutic Activity: ?Bed Mobility: Patient performed supine to sit with min A for R lower extremity management in a flat bed with use of bed rail. Provided verbal cues for awareness or R leg and progressing through L side-lying. ?Transfers: Patient performed sit to/from stand x7 with min A progressing to supervision following NMR, see below, without AD. Provided mod verbal cues and facilitation for forward weight shift and boosting up. ? ?Gait Training:  ?Patient ambulated 195 feet x2 without an AD with min A to facilitate L weight shift and correct R LOB due to decreased R foot clearance x3. Ambulated with decreased L weight shift, decreased R foot clearance and step height/length, mild forward trunk lean, and reduced trunk rotation and arm swing with R arm in mild flexor synergy throughout. Provided verbal cues for increased L  weight shift, increased hamstring activation in pre-swing, erect posture, and relaxation or trunk and upper extremities. ? ?Neuromuscular Re-ed: ?Patient performed the following sitting and standing balance activities during ALDs and lower extremity motor control activities for carry-over into functional transfers and gait: ?-sitting balance with supervision doffing/donning a shirt with mod cues for hemi-technique, upper body bathing with hand-over-hand for use of R hand to wash his L arm and total A for washing his back due to limited reach; unthreaded/threaded pants with total A with focus on patient lifting each leg without trunk compensation, performed lower body bathing with total A for feet and buttocks, otherwise set-up assist ?-standing balance x2 without upper extremity support while pulling down/up pants with min A-CGA and hand-over-hand assist for incorporating his R hand for clothing management for functional grasp ?-sit to stand 2x5 with mirror feedback for improved forward and up trajectory for reduced hip/trunk flexion increasing challenge to boost up, progressed from min A-supervision ?-step-taps x9 on R to 8" step without upper extremity support with min A for balance focused on hamstring, hip flexor, and DF activation to clear step for carry-over into gail ?-alternating step-taps x10 with min A focused on the above mechanics and alternating weight shifting ? ?Patient in recliner in the room at end of session with breaks locked, seat belt alarm set, and all needs within reach.  ? ?Therapy Documentation ?Precautions:  ?Precautions ?Precautions: Fall ?Precaution Comments: right hemi, mild right neglect, cortrak, urinary catheter, BP<160 ?Restrictions ?Weight Bearing Restrictions: No ? ? ? ?Therapy/Group: Individual Therapy ? ?Helayne Seminole PT, DPT ? ?11/21/2021, 2:21 PM  ?

## 2021-11-22 DIAGNOSIS — I69320 Aphasia following cerebral infarction: Secondary | ICD-10-CM

## 2021-11-22 LAB — CBC
HCT: 49.3 % (ref 39.0–52.0)
Hemoglobin: 16.4 g/dL (ref 13.0–17.0)
MCH: 27.3 pg (ref 26.0–34.0)
MCHC: 33.3 g/dL (ref 30.0–36.0)
MCV: 82.2 fL (ref 80.0–100.0)
Platelets: 264 10*3/uL (ref 150–400)
RBC: 6 MIL/uL — ABNORMAL HIGH (ref 4.22–5.81)
RDW: 13.4 % (ref 11.5–15.5)
WBC: 7.9 10*3/uL (ref 4.0–10.5)
nRBC: 0 % (ref 0.0–0.2)

## 2021-11-22 LAB — BASIC METABOLIC PANEL
Anion gap: 8 (ref 5–15)
BUN: 36 mg/dL — ABNORMAL HIGH (ref 6–20)
CO2: 26 mmol/L (ref 22–32)
Calcium: 9.1 mg/dL (ref 8.9–10.3)
Chloride: 102 mmol/L (ref 98–111)
Creatinine, Ser: 1.4 mg/dL — ABNORMAL HIGH (ref 0.61–1.24)
GFR, Estimated: >60 mL/min (ref >60)
Glucose, Bld: 119 mg/dL — ABNORMAL HIGH (ref 70–99)
Potassium: 3.7 mmol/L (ref 3.5–5.1)
Sodium: 136 mmol/L (ref 135–145)

## 2021-11-22 LAB — GLUCOSE, CAPILLARY
Glucose-Capillary: 107 mg/dL — ABNORMAL HIGH (ref 70–99)
Glucose-Capillary: 155 mg/dL — ABNORMAL HIGH (ref 70–99)
Glucose-Capillary: 107 mg/dL — ABNORMAL HIGH (ref 70–99)
Glucose-Capillary: 105 mg/dL — ABNORMAL HIGH (ref 70–99)
Glucose-Capillary: 125 mg/dL — ABNORMAL HIGH (ref 70–99)

## 2021-11-22 MED ORDER — HYDRALAZINE HCL 50 MG PO TABS
50.0000 mg | ORAL_TABLET | Freq: Four times a day (QID) | ORAL | Status: DC
  Administered 2021-11-22 – 2021-11-25 (×12): 50 mg
  Filled 2021-11-22 (×12): qty 1

## 2021-11-22 MED ORDER — PROSOURCE TF PO LIQD
90.0000 mL | Freq: Two times a day (BID) | ORAL | Status: DC
  Administered 2021-11-23 – 2021-11-30 (×15): 90 mL
  Filled 2021-11-22 (×16): qty 90

## 2021-11-22 MED ORDER — FREE WATER
200.0000 mL | Status: DC
  Administered 2021-11-22 – 2021-11-30 (×43): 200 mL

## 2021-11-22 MED ORDER — OSMOLITE 1.5 CAL PO LIQD
356.0000 mL | Freq: Four times a day (QID) | ORAL | Status: DC
  Administered 2021-11-22 – 2021-11-30 (×28): 356 mL
  Filled 2021-11-22 (×6): qty 474

## 2021-11-22 NOTE — Progress Notes (Signed)
Patient ID: Paul [name], male   DOB: December 02, 1985, 36 y.o.   MRN: [id number] ?Follow up on orders for bolus feeds and information for care of PEG, how to give meds and tube feedings per PEG with the patient. Nursing to review with the patient and family and practice administration to assure comfort with the process for discharge. [name] ? ?

## 2021-11-22 NOTE — Progress Notes (Addendum)
Nutrition Follow-up ? ?DOCUMENTATION CODES:  ? ?Not applicable ? ?INTERVENTION:  ?Transition to bolus tube feeds ? ?At 2000, initiate bolus feeds using Osmolite 1.5 cal formula via PEG at starting volume bolus of 120 ml (half carton/ARC) and advance by 120 ml at each feeding to goal volume bolus of 356 ml (1.5 cartons/ARCs) given QID. ? ?Provide 90 ml Prosource TF BID per tube.  ? ?Free water flushes of 200 ml q 4 hours. ? ?Tube feeding regimen provides 2296 kcal, 133 grams of protein, and 2282 ml free water.  ? ?NUTRITION DIAGNOSIS:  ? ?Increased nutrient needs related to acute illness (nontraumatic acute hemorrhage of basal ganglia) as evidenced by estimated needs; ongoing ? ?GOAL:  ? ?Patient will meet greater than or equal to 90% of their needs; met with TF ? ?MONITOR:  ? ?TF tolerance, Skin, Weight trends, Labs, I & O's ? ?REASON FOR ASSESSMENT:  ? ?Consult ?Assessment of nutrition requirement/status ? ?ASSESSMENT:  ? ?Patient is a 36 year old male admitted for left basal ganglia hemorrhage and pulmonary edema after presenting with aphasia. Past medical history includes poorly controlled HTN, TBI, hematoma with VP shunt. ? ?3/20 - Cortrak placed, tip in stomach  ?3/25 - admitted to CIR due to functional deficits due to stroke  ?3/26 - SLP evaluation recommending NPO  ?3/31 - PEG placed ? ?RD consulted to transition tube feeds to bolus feeds via PEG. RD to modify tube feeding orders. Labs and medications reviewed.  ? ?Diet Order:   ?Diet Order   ? ?       ?  Diet NPO time specified  Diet effective midnight       ?  ? ?  ?  ? ?  ? ? ?EDUCATION NEEDS:  ? ?Not appropriate for education at this time ? ?Skin:  Skin Assessment: Reviewed RN Assessment ? ?Last BM:  4/2 ? ?Height:  ? ?Ht Readings from Last 1 Encounters:  ?11/13/21 '6\' 3"'  (1.905 m)  ? ? ?Weight:  ? ?Wt Readings from Last 1 Encounters:  ?11/22/21 129.6 kg  ? ? ?Ideal Body Weight:  89.1 kg ? ?BMI:  Body mass index is 35.71 kg/m?. ? ?Estimated Nutritional  Needs:  ? ?Kcal:  2300-2500 ? ?Protein:  115-130 grams ? ?Fluid:  >/= 2 L/day ? ?Corrin Parker, MS, RD, LDN ?RD pager number/after hours weekend pager number on Amion. ? ?

## 2021-11-22 NOTE — Progress Notes (Signed)
Occupational Therapy Session Note ? ?Patient Details  ?Name: Paul [name] ?MRN: [id number] ?Date of Birth: [birth year] ? ?Today's Date: 11/22/2021 ?OT Individual Time: 0539-7673 ?OT Individual Time Calculation (min): 58 min  ? ? ?Short Term Goals: ?Week 1:  OT Short Term Goal 1 (Week 1): Pt will complete UB dressing with supervision ?OT Short Term Goal 1 - Progress (Week 1): Not met ?OT Short Term Goal 2 (Week 1): Pt will complete toileting with min assist ?OT Short Term Goal 2 - Progress (Week 1): Met ?OT Short Term Goal 3 (Week 1): Pt will complete LB dressing with min assist. ?OT Short Term Goal 3 - Progress (Week 1): Not met ?OT Short Term Goal 4 (Week 1): Pt will demonstrate functional use of RUE as stabilizer with mod cueing to attend. ?OT Short Term Goal 4 - Progress (Week 1): Met ? ?Skilled Therapeutic Interventions/Progress Updates:  ?Pt greeted seated in recliner pts father present initially during session. Pt agreeable to OT intervention. Session focus on BADL reeducation, functional mobility, dynamic standing balance, RUE coordination and decreasing overall caregiver burden.     ?Pt completed ambulatory shower transfer with no AD and MINA. Pt entered shower with MIN A and MIN verbal cues for sequencing. Pt completed bathing from shower seat with overall MIN A today! Able to demo improved functional AROM with RUE noted to wash RLE with wash cloth with no cues needed to initiate task. Pt sit>stand with CGA to wash buttock. Pt exited shower with MIN A and able to ambulate to EOB to dress with no AD. Pt completed UB dressing with MIN A; MIN A to don pants via sit>stand. Pt even donned socks and shoes today with MIN A with increased time and effort using RUE appropriately during task. Pt able to ambulate to gym with MIN HHA, pt was noted to scissor during gait needing MIN verbal cues to self correct.  ?Worked on AROM with RUE with pt working on grasping various balls to facilitate improved digit  flexion/extension for ADL participation. Also worked on wrist supination/pronation to pass bean bags from L <>R hand. Noted decreased ability to extend digits fully today, OTA provided PROM for digit extension with education on working on PROM in his room. ?Pt ambulated back to room with MIN HHA with w/c follow for safety. pt left seated in recliner with alarm belt activated and all needs within reach.                     ? ? ?Therapy Documentation ?Precautions:  ?Precautions ?Precautions: Fall ?Precaution Comments: right hemi, mild right neglect, cortrak, urinary catheter, BP<160 ?Restrictions ?Weight Bearing Restrictions: No ? ?Pain: ?No pain reported during session  ? ? ?Therapy/Group: Individual Therapy ? ?[name] ?11/22/2021, 3:47 PM ?

## 2021-11-22 NOTE — Progress Notes (Signed)
Occupational Therapy Weekly Progress Note ? ?Patient Details  ?Name: Paul [name] ?MRN: [id number] ?Date of Birth: Sep 16, 1985 ? ?Beginning of progress report period: November 15, 2021 ?End of progress report period: November 22, 2021 ? ?Today's Date: 11/22/2021 ?Patient has met 2 of 4 short term goals.  Pt currently requires MOD for bathing from shower level, MOD A for UB dressing, MAX A for LB dressing, MIN A for ambulatory toilet transfers with Rw and MIN A for 3/3 toileting tasks. Pt continues to present with RUE weakness/ incoordination, decreased activity tolerance and impaired balance. Family has not been present during OT sessions. Continue with POC.  ? ?Patient continues to demonstrate the following deficits: muscle weakness and language deficits, decreased cardiorespiratoy endurance, impaired timing and sequencing, unbalanced muscle activation, motor apraxia, and decreased coordination, decreased visual acuity and decreased visual perceptual skills, decreased attention, decreased awareness, decreased problem solving, decreased safety awareness, decreased memory, and delayed processing, and decreased sitting balance, decreased standing balance, decreased postural control, hemiplegia, and decreased balance strategies and therefore will continue to benefit from skilled OT intervention to enhance overall performance with BADL and Reduce care partner burden. ? ?Patient progressing toward long term goals..  Continue plan of care. ? ?OT Short Term Goals ?Week 1:  OT Short Term Goal 1 (Week 1): Pt will complete UB dressing with supervision ?OT Short Term Goal 1 - Progress (Week 1): Not met ?OT Short Term Goal 2 (Week 1): Pt will complete toileting with min assist ?OT Short Term Goal 2 - Progress (Week 1): Met ?OT Short Term Goal 3 (Week 1): Pt will complete LB dressing with min assist. ?OT Short Term Goal 3 - Progress (Week 1): Not met ?OT Short Term Goal 4 (Week 1): Pt will demonstrate functional use of RUE as stabilizer  with mod cueing to attend. ?OT Short Term Goal 4 - Progress (Week 1): Met ?Week 2:  OT Short Term Goal 1 (Week 2): pt will recall UB hemi strategies for 2 consecutive OT sessions ?OT Short Term Goal 2 (Week 2): pt will complete LB dressing with MOD A ?OT Short Term Goal 3 (Week 2): pt will complete 1/3 toileting tasks with CGA ?OT Short Term Goal 4 (Week 2): pt will complete bathing with MIN A from shower level ? ?Skilled Therapeutic Interventions/Progress Updates:  ?   ? ?Therapy Documentation ?Precautions:  ?Precautions ?Precautions: Fall ?Precaution Comments: right hemi, mild right neglect, cortrak, urinary catheter, BP<160 ?Restrictions ?Weight Bearing Restrictions: No ? ? ? ?Therapy/Group: Individual Therapy ? ?[name] ?11/22/2021, 8:01 AM  ?

## 2021-11-22 NOTE — Progress Notes (Addendum)
?                                                       PROGRESS NOTE ? ? ?Subjective/Complaints: ? ?No new issues this morning. Up in chair. Denies pain. Indicates that he wants to shower ? ?ROS: limited due to language/communication  ? ? ?Objective: ?  ?No results found. ?Recent Labs  ?  11/22/21 ?0656  ?WBC 7.9  ?HGB 16.4  ?HCT 49.3  ?PLT 264  ? ?Recent Labs  ?  11/22/21 ?0656  ?NA 136  ?K 3.7  ?CL 102  ?CO2 26  ?GLUCOSE 119*  ?BUN 36*  ?CREATININE 1.40*  ?CALCIUM 9.1  ? ? ?Intake/Output Summary (Last 24 hours) at 11/22/2021 1034 ?Last data filed at 11/22/2021 [id number] ?Gross per 24 hour  ?Intake 0 ml  ?Output 1660 ml  ?Net -1660 ml  ?  ? ?  ? ?Physical Exam: ?Vital Signs ?Blood pressure 136/90, pulse 61, temperature 98.1 ?F (36.7 ?C), temperature source Oral, resp. rate 18, height 6\' 3"  (1.905 m), weight 129.6 kg, SpO2 100 %. ? ? ?Constitutional: No distress . Vital signs reviewed. ?HEENT: NCAT, EOMI, oral membranes moist ?Neck: supple ?Cardiovascular: RRR without murmur. No JVD    ?Respiratory/Chest: CTA Bilaterally without wheezes or rales. Normal effort    ?GI/Abdomen: BS +, non-tender, non-distended ?Ext: no clubbing, cyanosis, or edema ?Psych: pleasant and cooperative  ?Skin: No evidence of breakdown, no evidence of rash ?Neurologic: Cranial nerves II through XII intact, motor strength is 5/5 in left 4/5 RIght deltoid, bicep, tricep, grip, hip flexor, knee extensors, ankle dorsiflexor and plantar flexor with inconsistent activtion. Aphasic apraxic ?Sensory exam normal for light touch and pain in all 4 limbs. No limb ataxia or cerebellar signs. No abnormal tone appreciated.   ?Musculoskeletal: Full range of motion in all 4 extremities. No joint swelling ? ? ? ?Assessment/Plan: ?1. Functional deficits which require 3+ hours per day of interdisciplinary therapy in a comprehensive inpatient rehab setting. ?Physiatrist is providing close team supervision and 24 hour management of active medical problems listed  below. ?Physiatrist and rehab team continue to assess barriers to discharge/monitor patient progress toward functional and medical goals ? ?Care Tool: ? ?Bathing ? Bathing activity did not occur: Safety/medical concerns ?Body parts bathed by patient: Right arm, Chest, Abdomen, Front perineal area, Left arm, Right upper leg, Left upper leg, Right lower leg, Left lower leg, Face, Buttocks  ? Body parts bathed by helper: Buttocks ?  ?  ?Bathing assist Assist Level: Moderate Assistance - Patient 50 - 74% (shower level via sit>stand) ?  ?  ?Upper Body Dressing/Undressing ?Upper body dressing   ?What is the patient wearing?: Pull over shirt ?   ?Upper body assist Assist Level: Moderate Assistance - Patient 50 - 74% ?   ?Lower Body Dressing/Undressing ?Lower body dressing ? ? ?   ?What is the patient wearing?: Pants ? ?  ? ?Lower body assist Assist for lower body dressing: Maximal Assistance - Patient 25 - 49% ?   ? ?Toileting ?Toileting    ?Toileting assist Assist for toileting: Minimal Assistance - Patient > 75% (standing at toilet) ?  ?  ?Transfers ?Chair/bed transfer ? ?Transfers assist ? Chair/bed transfer activity did not occur: Safety/medical concerns ? ?Chair/bed transfer assist level: Minimal Assistance - Patient > 75% ?Chair/bed transfer  assistive device: Other (no device) ?  ?Locomotion ?Ambulation ? ? ?Ambulation assist ? ?   ? ?Assist level: Minimal Assistance - Patient > 75% ?Assistive device: No Device ?Max distance: 195 ft  ? ?Walk 10 feet activity ? ? ?Assist ?   ? ?Assist level: Minimal Assistance - Patient > 75% ?Assistive device: No Device  ? ?Walk 50 feet activity ? ? ?Assist Walk 50 feet with 2 turns activity did not occur: Safety/medical concerns ? ?Assist level: Minimal Assistance - Patient > 75% ?Assistive device: No Device  ? ? ?Walk 150 feet activity ? ? ?Assist Walk 150 feet activity did not occur: Safety/medical concerns ? ?Assist level: Minimal Assistance - Patient > 75% ?Assistive device: No  Device ?  ? ?Walk 10 feet on uneven surface  ?activity ? ? ?Assist Walk 10 feet on uneven surfaces activity did not occur: Safety/medical concerns ? ? ?  ?   ? ?Wheelchair ? ? ? ? ?Assist Is the patient using a wheelchair?: Yes ?Type of Wheelchair: Manual ?  ? ?Wheelchair assist level: Minimal Assistance - Patient > 75% ?Max wheelchair distance: 15 ft  ? ? ?Wheelchair 50 feet with 2 turns activity ? ? ? ?Assist ? ?  ?  ? ? ?Assist Level: Total Assistance - Patient < 25%  ? ?Wheelchair 150 feet activity  ? ? ? ?Assist ?   ? ? ?Assist Level: Total Assistance - Patient < 25%  ? ?Blood pressure 136/90, pulse 61, temperature 98.1 ?F (36.7 ?C), temperature source Oral, resp. rate 18, height 6\' 3"  (1.905 m), weight 129.6 kg, SpO2 100 %. ? ?Medical Problem List and Plan: ?1. Functional deficits secondary to left basal ganglia ICH ?            -patient may shower ?            -ELOS/Goals: 4/7 supervision with PT,OT and sup/min with SLP ? -Continue CIR therapies including PT, OT, and SLP   ?2.  Antithrombotics: ?-DVT/anticoagulation:  Pharmaceutical: Lovenox ?            -antiplatelet therapy: N/A due to bleed ?3. Pain Management: tylenol prn.  ?4. Mood: LCSW to follow for evaluation and support when appropriate.  ?            -antipsychotic agents: N?A ?5. Neuropsych: This patient is not fully capable of making decisions on his own behalf. ?6. Skin/Wound Care: Routine pressure relief measures.  ?7. Fluids/Electrolytes/Nutrition: Monitor I/O. Check CMET on Monday ?8.  HTN: Monitor BP TID- continue Catapres, Norvasc, Coreg, hydralazine and HCTZ, and spironolactone\ ? -4/3 DBP still elevated. Increase hydralazine to q6 ?           ?Vitals:  ? 11/21/21 2028 11/22/21 0410  ?BP: (!) 147/88 136/90  ?Pulse: 63 61  ?Resp: 16 18  ?Temp: 98.5 ?F (36.9 ?C) 98.1 ?F (36.7 ?C)  ?SpO2: 97% 100%  ? ? ?9. Severe dysphagia due to CVA : Continue NPO with tube feeds for nutritional support. ?            -PEG functioning well tolerating TF, on  bolus feeds ?10. Acute on chronic renal failure: SCr 1.4 at baseline?-->1.78 3/25 ?            --continue to monitor with serial checks.  ?            --added water flushes.  ?  ? ?  Latest Ref Rng & Units 11/22/2021  ?  6:56 AM 11/16/2021  ?  7:07 AM 11/13/2021  ?  4:09 AM  ?BMP  ?Glucose 70 - 99 mg/dL 119   122   100    ?BUN 6 - 20 mg/dL 36   44   41    ?Creatinine 0.61 - 1.24 mg/dL 1.40   1.47   1.78    ?Sodium 135 - 145 mmol/L 136   138   140    ?Potassium 3.5 - 5.1 mmol/L 3.7   3.7   3.9    ?Chloride 98 - 111 mmol/L 102   102   103    ?CO2 22 - 32 mmol/L 26   26   27     ?Calcium 8.9 - 10.3 mg/dL 9.1   9.2   9.1    ?4/3 pt appears to be at baseline  ?11. Polycythemia: Hgb 19.4, JAK2 genetic testing still pending.  ? 3/27- Hb 17.0- con't to monitor ?12. Urinary retention: cimproved s/p foley removal  ?  ?13. 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 Flash pulmonary edema/acute respiratory failure: Weaned off BIPAP by 03/22 ?            --Monitor daily wts  down from 141-->137 kg. ?Cardiac MRI showed normal RIght and Left heart function  ?16.  Metatarsalgia RIght foot added voltaren gel and foot orthosis ? ?17.  Upper resp congestion due to Dysphagia, doing better with NGT removed  ? ? ?LOS: ?9 days ?A FACE TO FACE EVALUATION WAS PERFORMED ? ?Meredith Staggers ?11/22/2021, 10:34 AM  ? ?  ?

## 2021-11-22 NOTE — Progress Notes (Signed)
Physical Therapy Session Note ? ?Patient Details  ?Name: Paul [name] ?MRN: [id number] ?Date of Birth: July 15, 1986 ? ?Today's Date: 11/22/2021 ?PT Individual Time: 6962-9528 ?PT Individual Time Calculation (min): 58 min  ? ?Short Term Goals: ?Week 1:  PT Short Term Goal 1 (Week 1): Patient will perform basic transfers with CGA consistently. ?PT Short Term Goal 2 (Week 1): Patient will ambulate >100 feet using LRAD with min A. ?PT Short Term Goal 3 (Week 1): Patient will improve Berg Balance Scale by at least 7 points to meet MCID for improved postural control and reduced fall risk. ? ?Skilled Therapeutic Interventions/Progress Updates:  ?   ?Patient in recliner upon PT arrival. Patient alert and agreeable to PT session. Patient denied pain during session. ? ?Patient indicated he would like to get cleaned up before therapy. Retrieved paper scrubs and pants while RN disconnected tube feed for therapy. ? ?Therapeutic Activity: ?Transfers: Patient performed sit to/from stand x5 with supervision and min A x1 due to LOB with turning to sit on mat table. Provided verbal cues for forward weight shift and boosting up, pushed up with L hand without AD. ?Bathing/Dressing:  ?-sitting balance edge of recliner with supervision doffing/donning a shirt without cues for hemi-technique, good carry-over from yesterday, upper body bathing with hand-over-hand for use of R hand to wash his L arm and total A for washing his back due to limited reach; unthreaded/threaded pants with total A with focus on patient lifting each leg without trunk compensation, performed lower body bathing with total A for feet and buttocks, otherwise set-up assist ?-standing balance x2 without upper extremity support while pulling down/up pants with min A-CGA and hand-over-hand assist for incorporating his R hand for clothing management for functional grasp ? ?Gait Training:  ?Patient ambulated 438 feet, 664 feet, 847 feet feet progressing from L arm over  therapist shoulder x2 to no AD x1 with min A and intermittent CGA focused on L weight shift, R foot clearance, forward propulsion, and increased gait speed with mod cues and min facilitation L and forward with high intensity interval training with rest breaks <2 min.  ?Notable reduce R foot clearance with fatigue for last ~50 feet of last gait trail with return to arm over therapist's shoulders ? ?Patient in recliner in the room at end of session with breaks locked, seat belt alarm set, and all needs within reach.  ? ?Therapy Documentation ?Precautions:  ?Precautions ?Precautions: Fall ?Precaution Comments: right hemi, mild right neglect, cortrak, urinary catheter, BP<160 ?Restrictions ?Weight Bearing Restrictions: No ? ? ? ? ?Therapy/Group: Individual Therapy ? ?Helayne Seminole PT, DPT ? ?11/22/2021, 12:58 PM  ?

## 2021-11-22 NOTE — Progress Notes (Signed)
Physical Therapy Session Note ? ?Patient Details  ?Name: Paul [name] ?MRN: [id number] ?Date of Birth: May 28, 1986 ? ?Today's Date: 11/22/2021 ?PT Individual Time: 3500-9381 ?PT Individual Time Calculation (min): 27 min  ? ?Short Term Goals: ?Week 1:  PT Short Term Goal 1 (Week 1): Patient will perform basic transfers with CGA consistently. ?PT Short Term Goal 2 (Week 1): Patient will ambulate >100 feet using LRAD with min A. ?PT Short Term Goal 3 (Week 1): Patient will improve Berg Balance Scale by at least 7 points to meet MCID for improved postural control and reduced fall risk. ? ?Skilled Therapeutic Interventions/Progress Updates:  ?   ? ?Pt presenting sitting in recliner with NT assessing BG. Pt nonverbal but communicates well with hand gestures and head nods. Pt denies pain and requests urinal to void. Pt completed sit<>stand with minA from recliner to RW. Continent of bladder in standing with CGA for balance. LPN then entering room for morning medications that were delivered via tube feeds. We worked on standing balance with somatosensory and proprioception training in the room - static standing unsupported with feet together (minA), feet apart (CGA), semi-tandem (minA), and full tandem (unable). Also worked on eyes closed in those same positions with minA needed for all due to frequent LOB to the R from R knee buckling and instability. Pt concluded session seated in recliner with all needs in reach.  ? ?Therapy Documentation ?Precautions:  ?Precautions ?Precautions: Fall ?Precaution Comments: right hemi, mild right neglect, cortrak, urinary catheter, BP<160 ?Restrictions ?Weight Bearing Restrictions: No ?General: ?  ? ?Therapy/Group: Individual Therapy ? ?Orrin Brigham PT ?11/22/2021, 8:26 AM  ?

## 2021-11-23 LAB — GLUCOSE, CAPILLARY
Glucose-Capillary: 103 mg/dL — ABNORMAL HIGH (ref 70–99)
Glucose-Capillary: 106 mg/dL — ABNORMAL HIGH (ref 70–99)
Glucose-Capillary: 124 mg/dL — ABNORMAL HIGH (ref 70–99)
Glucose-Capillary: 93 mg/dL (ref 70–99)
Glucose-Capillary: 93 mg/dL (ref 70–99)

## 2021-11-23 NOTE — Progress Notes (Signed)
Speech Language Pathology Weekly Progress and Session Note ? ?Patient Details  ?Name: Paul [name] ?MRN: [id number] ?Date of Birth: 1985-08-26 ? ?Beginning of progress report period: November 14, 2021 ?End of progress report period: November 23, 2021 ? ?Today's Date: 11/23/2021 ?SLP Individual Time: 8527-7824 ?SLP Individual Time Calculation (min): 46 min ? ?Short Term Goals: ?Week 1: SLP Short Term Goal 1 (Week 1): Patient will consume ice chips/thin liquid trials with min overt s/sx of aspiration to indicate readiness for MBS. ?SLP Short Term Goal 1 - Progress (Week 1): Progressing toward goal ?SLP Short Term Goal 2 (Week 1): Patient will produce phonemes (/a/, /m/) with appropriate voicing in 80% of opportunities given max verbal/visual cues. ?SLP Short Term Goal 2 - Progress (Week 1): Met ?SLP Short Term Goal 3 (Week 1): Patient will demonstrate use of diaphragmatic breathing with minA. ?SLP Short Term Goal 3 - Progress (Week 1): Progressing toward goal ?SLP Short Term Goal 4 (Week 1): Pt will complete effortful swallows during ice chip/water trials to increase pharyngeal strength with minA multimodal cues. ?SLP Short Term Goal 4 - Progress (Week 1): Discontinued (comment) (Difficult for patient to coordinate/execute at this time) ? ?  ?New Short Term Goals: ?Week 2: SLP Short Term Goal 1 (Week 2): STG=LTG due to ELOS ? ?Weekly Progress Updates: Patient has made slow gains and has met 1 of 3 STGs this reporting period. STG 4 discontinued, as pt exhibits significant difficulty executing/coordinating effortful swallow at this time. Patient remains NPO with long-term source of nutrition in place via PEG. Patient is completing therapeutic PO trials following oral care with ice chips and tsp of thin liquid (water only) with significant oral delay, decreased oral manipulation and containment, suspected swallow delay, and significant s/sx of aspiration. Swallow function also appears significantly affected by fatigue. Recommend  pt continue NPO and continue PO trials with SLP. Patient continues to communicate via multimodal means secondary to severe dysarthria vs. anarthria. Pt demonstrates mild improvement in phoneme production and vocalization at the single syllable level. Pt demonstrates signs of slightly increased jaw opening, labial ROM, and lingual protrusion. Pt is able to verbally produce "mmh hmm" and "no" with mod A verbal cues and additional time to coordinate breath support and speech. Patient and family education is ongoing. Patient would benefit from continued skilled SLP intervention to maximize cognitive functioning and overall functional independence prior to discharge.   ?  ?Intensity: Minumum of 1-2 x/day, 30 to 90 minutes ?Frequency: 3 to 5 out of 7 days ?Duration/Length of Stay: 4/11 ?Treatment/Interventions: Pharmacist, hospital hierarchy;Functional tasks;Therapeutic Activities;Internal/external aids;Oral motor exercises;Therapeutic Exercise;Dysphagia/aspiration precaution training;Patient/family education ? ? ?Daily Session ?Skilled Therapeutic Interventions: Skilled ST treatment focused on communication and swallowing goals. Pt pleased with PEG tube placement which resulted in removal of cortrak. Patient completed oral care following set-up A using suction toothbrush and sup A for thoroughness. Pt consumed ice chips and 1/2 tsp of thin water with intermittent s/sx of aspiration (coughing) and congested breath sounds requiring cues to implement protective throat clear. Pt exhibited slight improvement with AP transit, however continues to exhibit significant oral delay and suspected swallow initiation delay. SLP recommend continuation of NPO status and primary source of nutrition via PEG. SLP facilitated phoneme production with mod A verbal/visual cues and additional time to produce /m/, /n/, p/. Pt produced the words "no" and "mmh hmm" in response  to yes/no questions with mod A verbal reminders and encouragement. Pt also able to produce "one" and "pie"  with 25% intelligible. Pt continues to require extra time to coordinate breath support for speech and typically required multiple breaths and attempts prior to intelligible and effective vocalizations. Pt encouraged to respond verbally to yes/no questions during encounters with staff and visitors. Pt displayed good carry over at end of session. Patient was left in recliner with alarm activated and immediate needs within reach at end of session. Continue per current plan of care.     ? ?General  ?  ?Pain ? None ? ?Therapy/Group: Individual Therapy ? ?Paul [name] ?11/23/2021, 4:34 PM ? ? ? ? ? ? ?

## 2021-11-23 NOTE — Progress Notes (Signed)
?                                                       PROGRESS NOTE ? ? ?Subjective/Complaints: ? ?Tolerating bolus feeds , no abd discomfort except around gastrostomy site  ? ?ROS: limited due to language/communication  ? ? ?Objective: ?  ?No results found. ?Recent Labs  ?  11/22/21 ?0656  ?WBC 7.9  ?HGB 16.4  ?HCT 49.3  ?PLT 264  ? ? ?Recent Labs  ?  11/22/21 ?0656  ?NA 136  ?K 3.7  ?CL 102  ?CO2 26  ?GLUCOSE 119*  ?BUN 36*  ?CREATININE 1.40*  ?CALCIUM 9.1  ? ? ? ?Intake/Output Summary (Last 24 hours) at 11/23/2021 0925 ?Last data filed at 11/22/2021 2121 ?Gross per 24 hour  ?Intake 9600 ml  ?Output 350 ml  ?Net 9250 ml  ? ?  ? ?  ? ?Physical Exam: ?Vital Signs ?Blood pressure (!) 153/83, pulse 65, temperature 98.6 ?F (37 ?C), temperature source Oral, resp. rate 17, height 6\' 3"  (1.905 m), weight 127.5 kg, SpO2 98 %. ? ? ? ?General: No acute distress ?Mood and affect are appropriate ?Heart: Regular rate and rhythm no rubs murmurs or extra sounds ?Lungs: Clear to auscultation, breathing unlabored, no rales or wheezes ?Abdomen: Positive bowel sounds, soft nontender to palpation, nondistended ?Extremities: No clubbing, cyanosis, or edema ? ? ?Skin: No evidence of breakdown, no evidence of rash ?Neurologic: Cranial nerves II through XII intact, motor strength is 5/5 in left 4/5 RIght deltoid, bicep, tricep, grip, hip flexor, knee extensors, ankle dorsiflexor and plantar flexor with inconsistent activtion. Aphasic apraxic ?Sensory exam normal for light touch and pain in all 4 limbs. No limb ataxia or cerebellar signs. No abnormal tone appreciated.   ?Musculoskeletal: Full range of motion in all 4 extremities. No joint swelling ? ? ? ?Assessment/Plan: ?1. Functional deficits which require 3+ hours per day of interdisciplinary therapy in a comprehensive inpatient rehab setting. ?Physiatrist is providing close team supervision and 24 hour management of active medical problems listed below. ?Physiatrist and rehab team  continue to assess barriers to discharge/monitor patient progress toward functional and medical goals ? ?Care Tool: ? ?Bathing ? Bathing activity did not occur: Safety/medical concerns ?Body parts bathed by patient: Right arm, Chest, Abdomen, Front perineal area, Left arm, Right upper leg, Left upper leg, Right lower leg, Left lower leg, Face, Buttocks  ? Body parts bathed by helper: Buttocks ?  ?  ?Bathing assist Assist Level: Minimal Assistance - Patient > 75% ?  ?  ?Upper Body Dressing/Undressing ?Upper body dressing   ?What is the patient wearing?: Pull over shirt ?   ?Upper body assist Assist Level: Minimal Assistance - Patient > 75% ?   ?Lower Body Dressing/Undressing ?Lower body dressing ? ? ?   ?What is the patient wearing?: Pants ? ?  ? ?Lower body assist Assist for lower body dressing: Minimal Assistance - Patient > 75% ?   ? ?Toileting ?Toileting    ?Toileting assist Assist for toileting: Minimal Assistance - Patient > 75% (standing at toilet) ?  ?  ?Transfers ?Chair/bed transfer ? ?Transfers assist ? Chair/bed transfer activity did not occur: Safety/medical concerns ? ?Chair/bed transfer assist level: Minimal Assistance - Patient > 75% ?Chair/bed transfer assistive device: Other (no device) ?  ?Locomotion ?Ambulation ? ? ?  Ambulation assist ? ?   ? ?Assist level: Minimal Assistance - Patient > 75% ?Assistive device: No Device ?Max distance: 847 ft  ? ?Walk 10 feet activity ? ? ?Assist ?   ? ?Assist level: Minimal Assistance - Patient > 75% ?Assistive device: No Device  ? ?Walk 50 feet activity ? ? ?Assist Walk 50 feet with 2 turns activity did not occur: Safety/medical concerns ? ?Assist level: Minimal Assistance - Patient > 75% ?Assistive device: No Device  ? ? ?Walk 150 feet activity ? ? ?Assist Walk 150 feet activity did not occur: Safety/medical concerns ? ?Assist level: Minimal Assistance - Patient > 75% ?Assistive device: No Device ?  ? ?Walk 10 feet on uneven surface  ?activity ? ? ?Assist Walk 10  feet on uneven surfaces activity did not occur: Safety/medical concerns ? ? ?  ?   ? ?Wheelchair ? ? ? ? ?Assist Is the patient using a wheelchair?: Yes ?Type of Wheelchair: Manual ?  ? ?Wheelchair assist level: Minimal Assistance - Patient > 75% ?Max wheelchair distance: 15 ft  ? ? ?Wheelchair 50 feet with 2 turns activity ? ? ? ?Assist ? ?  ?  ? ? ?Assist Level: Total Assistance - Patient < 25%  ? ?Wheelchair 150 feet activity  ? ? ? ?Assist ?   ? ? ?Assist Level: Total Assistance - Patient < 25%  ? ?Blood pressure (!) 153/83, pulse 65, temperature 98.6 ?F (37 ?C), temperature source Oral, resp. rate 17, height 6\' 3"  (1.905 m), weight 127.5 kg, SpO2 98 %. ? ?Medical Problem List and Plan: ?1. Functional deficits secondary to left basal ganglia ICH ?            -patient may shower ?            -ELOS/Goals: 4/7 supervision with PT,OT and sup/min with SLP ? -Continue CIR therapies including PT, OT, and SLP  - team conf in am  ?2.  Antithrombotics: ?-DVT/anticoagulation:  Pharmaceutical: Lovenox ?            -antiplatelet therapy: N/A due to bleed ?3. Pain Management: tylenol prn.  ?4. Mood: LCSW to follow for evaluation and support when appropriate.  ?            -antipsychotic agents: N?A ?5. Neuropsych: This patient is not fully capable of making decisions on his own behalf. ?6. Skin/Wound Care: Routine pressure relief measures.  ?7. Fluids/Electrolytes/Nutrition: Monitor I/O. Check CMET on Monday ?8.  HTN: Monitor BP TID- continue Catapres, Norvasc, Coreg, hydralazine and HCTZ, and spironolactone\ ? -4/3 DBP still elevated. Increase hydralazine to q6 ?           ?Vitals:  ? 11/23/21 0038 11/23/21 0539  ?BP: (!) 149/77 (!) 153/83  ?Pulse:  65  ?Resp:  17  ?Temp:  98.6 ?F (37 ?C)  ?SpO2:  98%  ? ? ?9. Severe dysphagia due to CVA : Continue NPO with tube feeds for nutritional support. ?            -PEG functioning well tolerating TF, on bolus feeds ?10. Acute on chronic renal failure: SCr 1.4 at baseline?-->1.78  3/25 ?            --continue to monitor with serial checks.  ?            --added water flushes.  ?  ? ?  Latest Ref Rng & Units 11/22/2021  ?  6:56 AM 11/16/2021  ?  7:07 AM 11/13/2021  ?  4:09 AM  ?BMP  ?Glucose 70 - 99 mg/dL 119   122   100    ?BUN 6 - 20 mg/dL 36   44   41    ?Creatinine 0.61 - 1.24 mg/dL 1.40   1.47   1.78    ?Sodium 135 - 145 mmol/L 136   138   140    ?Potassium 3.5 - 5.1 mmol/L 3.7   3.7   3.9    ?Chloride 98 - 111 mmol/L 102   102   103    ?CO2 22 - 32 mmol/L 26   26   27     ?Calcium 8.9 - 10.3 mg/dL 9.1   9.2   9.1    ?4/4 pt appears to be at baseline  ?11. Polycythemia: Hgb 19.4, JAK2 genetic testing still pending.  ? 3/27- Hb 17.0- con't to monitor ?12. Urinary retention: cimproved s/p foley removal  ?  ?13. 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 Flash pulmonary edema/acute respiratory failure: Weaned off BIPAP by 03/22 ?            --Monitor daily wts  down from 141-->137 kg. ?Cardiac MRI showed normal RIght and Left heart function  ?16.  Metatarsalgia RIght foot added voltaren gel and foot orthosis- improved , standing without c/o pain  ? ?17.  Upper resp congestion due to Dysphagia, doing better with NGT removed  ? ? ?LOS: ?10 days ?A FACE TO FACE EVALUATION WAS PERFORMED ? ?Luanna Salk Ember Gottwald ?11/23/2021, 9:25 AM  ? ?  ?

## 2021-11-23 NOTE — Progress Notes (Signed)
Physical Therapy Weekly Progress Note ? ?Patient Details  ?Name: Paul [name] ?MRN: [id number] ?Date of Birth: 1986/06/19 ? ?Beginning of progress report period: November 14, 2021 ?End of progress report period: November 23, 2021 ? ?Today's Date: 11/23/2021 ?PT Individual Time: 5643-3295 and [phone] ?PT Individual Time Calculation (min): 70 min and 29 min ? ?Patient has met 3 of 3 short term goals.  Mr. Pesta is progressing well with therapy demonstrating increasing independence with functional mobility. He continues to demo R hemiparesis (UE>LE) with poor R foot clearance during gait as well as significantly impaired balance as noted by Merrilee Jansky Balance Scale score. He is performing bed mobility with CGA, sit<>stands CGA without AD, stand pivot transfers min assist without AD, and ambulating up to 827f min assist without AD as well as navigating 12 stairs using B HRs with min assist. He will benefit from continued CIR level therapies to further progress his independence with functional mobility to reach supervision level goals prior to D/Cing home with 24hr support from his family.  ? ?Patient continues to demonstrate the following deficits muscle weakness, decreased cardiorespiratoy endurance, impaired timing and sequencing, abnormal tone, unbalanced muscle activation, decreased coordination, and decreased motor planning, decreased visual motor skills, decreased attention to right, decreased attention, decreased awareness, decreased problem solving, decreased safety awareness, and delayed processing, and decreased standing balance, decreased postural control, hemiplegia, and decreased balance strategies and therefore will continue to benefit from skilled PT intervention to increase functional independence with mobility. ? ?Patient progressing toward long term goals..  Continue plan of care. ? ?PT Short Term Goals ?Week 1:  PT Short Term Goal 1 (Week 1): Patient will perform basic transfers with CGA consistently. ?PT Short  Term Goal 1 - Progress (Week 1): Met ?PT Short Term Goal 2 (Week 1): Patient will ambulate >100 feet using LRAD with min A. ?PT Short Term Goal 2 - Progress (Week 1): Met ?PT Short Term Goal 3 (Week 1): Patient will improve Berg Balance Scale by at least 7 points to meet MCID for improved postural control and reduced fall risk. ?PT Short Term Goal 3 - Progress (Week 1): Met ?Week 2:  PT Short Term Goal 1 (Week 2): = to LTGs based on ELOS ? ?Skilled Therapeutic Interventions/Progress Updates:  ?Ambulation/gait training;Cognitive remediation/compensation;Discharge planning;DME/adaptive equipment instruction;Pain management;Functional mobility training;Psychosocial support;Splinting/orthotics;Therapeutic Activities;UE/LE Strength taining/ROM;Visual/perceptual remediation/compensation;Balance/vestibular training;Community reintegration;Disease management/prevention;Functional electrical stimulation;Neuromuscular re-education;Patient/family education;Skin care/wound management;Stair training;Therapeutic Exercise;UE/LE Coordination activities;Wheelchair propulsion/positioning  ? ?Session 1: Pt received sitting in recliner and agreeable to therapy session. Pt continues to remain non-verbal throughout session using either head nods, thumbs up/down, or texting on his phone for communication. Nurse present for morning medications. Sit<>stands, no AD, with CGA for steadying throughout session - pt noted to compensate significantly by pushing up with L hand during transfers - cued for increased R weight shift for R LE NMR and decreased L hemibody compensation, with pt demoing good carryover throughout session. ? ?Gait training ~1583fto main therapy gym, no AD, with CGA/light min assist for balance with pt having 1x mild L LOB when turning towards L while rotating head - continues to have poor R foot clearance during swing with decreased step length requiring repeated cuing for correction. ? ?MD in/out for morning assessment -  pt inquiring about moving his D/C date up to 11/26/21, planning to discuss in team conference tomorrow. Therapist provided pt with [facility] information in the area because he is uninsured. Educated pt on recommendation at this time to use RW  for standing/ambulation upon D/C. ? ?Patient participated in [place] and demonstrates increased fall risk as noted by score of  36/56; however, this is a significant improvement compared to 5/56 noted  on 11/14/21 (<36= high risk for falls, close to 100%; 37-45 significant >80%; 46-51 moderate >50%; 52-55 lower >25%). ? ?Donned 4lb ankle weight on R LE for NMR. ? ?Stair navigation training ascending/descending 12 steps (6" height) using B HRs with min assist for balance - reciprocal pattern in both directions for R LE NMR (no knee instability noted) - cuing for R hand management on HR. ? ?Dynamic gait training >344f starting wearing 4lb ankle weight including sudden start/stops, sudden R/L turns or head turns, side stepping, and backwards walking - min assist for balance throughout  ?- continued cuing for improved R foot clearance during swing - repeated same dynamic gait but without weight on R LE ?- when stepping towards R continues to require cuing to bring R foot backwards when stepping out as he tends to step it slightly forward causing R posterior LOB; this improved with additional practice ?- demos improving step length with R LE when ambulating backwards ?- continues to have instability when turning ? ? ?PA present during session and inquiring about R UE Giv[employer] sling; however, this therapist unsure if it is appropriate at this time as pt is R handed and does not have decreased tone nor shoulder subluxation and would prefer to incorporate R UE into tasks to encourage use for NMR. ? ?Gait training ~1565fback to room, no AD, with light min assist for balance as described above and continued cuing for increased R foot clearance. ? ?At end of session, pt left  seated in recliner with needs in reach and seat belt alarm on. ? ? ? ?Session 2: Pt received sitting in recliner with his dad, MiRonalee Beltspresent and pt agreeable to therapy session. Pt points to his room whiteboard that shows his D/C date as April 7th with pt explaining that is why he said he thought his D/C date was then as opposed to the 11th, which is the actual date set by the team in conference last week. Therapist educated pt on why staying until April 11th is recommended by this therapist to progress to increased independence with a lesser restrictive AD as well as decreasing his fall risk especially given that pt is uninsured at this time limiting his access to follow-up therapies. Therapist showed pt's father the hand-out on [facility] PT clinic information and recommended pt speak with SW regarding options to receive insurance vs other [facility] opportunities in the area. Planning to discuss D/C plans during team conference again tomorrow.  ? ?Therapist encouraged pt to participate in vocalization of "no" and "uh-hu" (aka yes) during session per SLP recommendations and pt very compliant with supportive encouragement. ? ?Sit<>stands, no AD, with CGA for steadying during session.  ? ?Gait training ~15023f2 to/from dayroom, no AD, with light min assist for balance - continues to demo poor R foot clearance during swing as well as increased truncal movement indicating decreased postural stability. ? ?Dynamic stepping task for R LE NMR via R/L lateral side stepping over walking stick then forward/backwards stepping leading with R LE targeting increased R hip/knee flexion to clear foot - heavier min assist for balance primarily with R posterior lean/LOB. ? ?At end of session, pt left seated in recliner with needs in reach and seat belt alarm on. ? ?Therapy Documentation ?Precautions:  ?Precautions ?Precautions: Fall ?Precaution  Comments: right hemi, mild right neglect, cortrak, urinary catheter,  BP<160 ?Restrictions ?Weight Bearing Restrictions: No ? ? ?Pain: ? Session 1: Denies pain during session. ? ?Session 2: Denies pain during session. ? ?Balance: ?Standardized Balance Assessment ?Standardized Balance Assessment

## 2021-11-23 NOTE — Progress Notes (Signed)
Patient ID: Paul [name], male   DOB: Oct 14, 1985, 36 y.o.   MRN: [id number] ? ?Suction machine ordered through [employer] ?

## 2021-11-23 NOTE — Progress Notes (Signed)
Occupational Therapy Session Note ? ?Patient Details  ?Name: Paul [name] ?MRN: [id number] ?Date of Birth: 1986-03-30 ? ?Today's Date: 11/23/2021 ?OT Individual Time: 0932-3557 ?OT Individual Time Calculation (min): 56 min  ? ? ?Short Term Goals: ?Week 2:  OT Short Term Goal 1 (Week 2): pt will recall UB hemi strategies for 2 consecutive OT sessions ?OT Short Term Goal 2 (Week 2): pt will complete LB dressing with MOD A ?OT Short Term Goal 3 (Week 2): pt will complete 1/3 toileting tasks with CGA ?OT Short Term Goal 4 (Week 2): pt will complete bathing with MIN A from shower level ? ?Skilled Therapeutic Interventions/Progress Updates:  ?Pt greeted seated in recliner agreeable to OT intervention. Session focus on BADL reeducation, functional mobility, dynamic standing balance, RUE coordination, bilateral integration FMC and decreasing overall caregiver burden.    ?Pt completed ambulatory transfer to w/c with no AD and MIN- CGA. Pt transported to ADL apt with total A for time mgmt. Pt completed ambulatory transfer to tub shower to TTB with no AD and MINA ( pt reports he has TTB at home). Pt also completed ambulatory transfer to bed with no AD and MINA as well as to low couch. Pt also engaged in dynamic reaching task in kitchen with pt instructed to reach [facility] into cabinets and at hip height for IADL items to facilitate improved functional reach with RUE and challenge dynamic standing balance. Pt completed task with overall MINA with no AD.  ?Pt transported outside with total A from w/c to increase overall mood. Pt worked on [facility] with RUE by [employer] blocks with an emphasis on digit flexion/extension, bilateral integration and gross grasp with RUE.  Pt completed task with increased time and effort with overall MIN A needed to stabilize blocks at times. Pt also completed 2x10 ft bouts of functional ambulation outside with no AD and MINA. Pt transported back inside with with total A, pt left seated in recliner with  alarm belt activated and all needs within reach.                 ? ?Therapy Documentation ?Precautions:  ?Precautions ?Precautions: Fall ?Precaution Comments: right hemi, mild right neglect, cortrak, urinary catheter, BP<160 ?Restrictions ?Weight Bearing Restrictions: No ? ?Pain: ?No pain reported during session  ? ? ?Therapy/Group: Individual Therapy ? ?[name] ?11/23/2021, 12:12 PM ?

## 2021-11-23 NOTE — Progress Notes (Signed)
Patient ID: Paul [name], male   DOB: 1985-09-01, 36 y.o.   MRN: [id number] ? ?Patient mother attending family education on Wednesday 4/5 1-4 PM ?

## 2021-11-24 LAB — GLUCOSE, CAPILLARY
Glucose-Capillary: 100 mg/dL — ABNORMAL HIGH (ref 70–99)
Glucose-Capillary: 101 mg/dL — ABNORMAL HIGH (ref 70–99)
Glucose-Capillary: 104 mg/dL — ABNORMAL HIGH (ref 70–99)
Glucose-Capillary: 115 mg/dL — ABNORMAL HIGH (ref 70–99)
Glucose-Capillary: 133 mg/dL — ABNORMAL HIGH (ref 70–99)
Glucose-Capillary: 94 mg/dL (ref 70–99)
Glucose-Capillary: 98 mg/dL (ref 70–99)

## 2021-11-24 MED ORDER — CLONIDINE HCL 0.2 MG/24HR TD PTWK
0.2000 mg | MEDICATED_PATCH | TRANSDERMAL | Status: DC
  Administered 2021-11-24: 0.2 mg via TRANSDERMAL
  Filled 2021-11-24: qty 1

## 2021-11-24 NOTE — Progress Notes (Signed)
?                                                       PROGRESS NOTE ? ? ?Subjective/Complaints: ? ?No issues overnite , has not learn TF bolusing yet ? ?ROS: limited due to language/communication  ? ? ?Objective: ?  ?No results found. ?Recent Labs  ?  11/22/21 ?0656  ?WBC 7.9  ?HGB 16.4  ?HCT 49.3  ?PLT 264  ? ? ?Recent Labs  ?  11/22/21 ?0656  ?NA 136  ?K 3.7  ?CL 102  ?CO2 26  ?GLUCOSE 119*  ?BUN 36*  ?CREATININE 1.40*  ?CALCIUM 9.1  ? ? ? ?Intake/Output Summary (Last 24 hours) at 11/24/2021 0848 ?Last data filed at 11/24/2021 0630 ?Gross per 24 hour  ?Intake 1000 ml  ?Output 1440 ml  ?Net -440 ml  ? ?  ? ?  ? ?Physical Exam: ?Vital Signs ?Blood pressure (!) 155/91, pulse 70, temperature 98.4 ?F (36.9 ?C), resp. rate 18, height 6\' 3"  (1.905 m), weight 127 kg, SpO2 96 %. ? ? ? ?General: No acute distress ?Mood and affect are appropriate ?Heart: Regular rate and rhythm no rubs murmurs or extra sounds ?Lungs: Clear to auscultation, breathing unlabored, no rales or wheezes ?Abdomen: Positive bowel sounds, soft nontender to palpation, nondistended ?Extremities: No clubbing, cyanosis, or edema ? ? ?Skin: No evidence of breakdown, no evidence of rash ?Neurologic: Cranial nerves II through XII intact, motor strength is 5/5 in left 4/5 RIght deltoid, bicep, tricep, grip, hip flexor, knee extensors, ankle dorsiflexor and plantar flexor with inconsistent activtion. Aphasic apraxic ?Sensory exam normal for light touch and pain in all 4 limbs. No limb ataxia or cerebellar signs. No abnormal tone appreciated.   ?Musculoskeletal: Full range of motion in all 4 extremities. No joint swelling ? ? ? ?Assessment/Plan: ?1. Functional deficits which require 3+ hours per day of interdisciplinary therapy in a comprehensive inpatient rehab setting. ?Physiatrist is providing close team supervision and 24 hour management of active medical problems listed below. ?Physiatrist and rehab team continue to assess barriers to discharge/monitor  patient progress toward functional and medical goals ? ?Care Tool: ? ?Bathing ? Bathing activity did not occur: Safety/medical concerns ?Body parts bathed by patient: Right arm, Chest, Abdomen, Front perineal area, Left arm, Right upper leg, Left upper leg, Right lower leg, Left lower leg, Face, Buttocks  ? Body parts bathed by helper: Buttocks ?  ?  ?Bathing assist Assist Level: Minimal Assistance - Patient > 75% ?  ?  ?Upper Body Dressing/Undressing ?Upper body dressing   ?What is the patient wearing?: Pull over shirt ?   ?Upper body assist Assist Level: Minimal Assistance - Patient > 75% ?   ?Lower Body Dressing/Undressing ?Lower body dressing ? ? ?   ?What is the patient wearing?: Pants ? ?  ? ?Lower body assist Assist for lower body dressing: Minimal Assistance - Patient > 75% ?   ? ?Toileting ?Toileting    ?Toileting assist Assist for toileting: Minimal Assistance - Patient > 75% (standing at toilet) ?  ?  ?Transfers ?Chair/bed transfer ? ?Transfers assist ? Chair/bed transfer activity did not occur: Safety/medical concerns ? ?Chair/bed transfer assist level: Minimal Assistance - Patient > 75% ?Chair/bed transfer assistive device: Other (no device) ?  ?Locomotion ?Ambulation ? ? ?Ambulation assist ? ?   ? ?  Assist level: Minimal Assistance - Patient > 75% ?Assistive device: No Device ?Max distance: 324ft  ? ?Walk 10 feet activity ? ? ?Assist ?   ? ?Assist level: Minimal Assistance - Patient > 75% ?Assistive device: No Device  ? ?Walk 50 feet activity ? ? ?Assist Walk 50 feet with 2 turns activity did not occur: Safety/medical concerns ? ?Assist level: Minimal Assistance - Patient > 75% ?Assistive device: No Device  ? ? ?Walk 150 feet activity ? ? ?Assist Walk 150 feet activity did not occur: Safety/medical concerns ? ?Assist level: Minimal Assistance - Patient > 75% ?Assistive device: No Device ?  ? ?Walk 10 feet on uneven surface  ?activity ? ? ?Assist Walk 10 feet on uneven surfaces activity did not occur:  Safety/medical concerns ? ? ?  ?   ? ?Wheelchair ? ? ? ? ?Assist Is the patient using a wheelchair?: Yes ?Type of Wheelchair: Manual ?  ? ?Wheelchair assist level: Minimal Assistance - Patient > 75% ?Max wheelchair distance: 15 ft  ? ? ?Wheelchair 50 feet with 2 turns activity ? ? ? ?Assist ? ?  ?  ? ? ?Assist Level: Total Assistance - Patient < 25%  ? ?Wheelchair 150 feet activity  ? ? ? ?Assist ?   ? ? ?Assist Level: Total Assistance - Patient < 25%  ? ?Blood pressure (!) 155/91, pulse 70, temperature 98.4 ?F (36.9 ?C), resp. rate 18, height 6\' 3"  (1.905 m), weight 127 kg, SpO2 96 %. ? ?Medical Problem List and Plan: ?1. Functional deficits secondary to left basal ganglia ICH ?            -patient may shower ?            -ELOS/Goals: 4/7 supervision with PT,OT and sup/min with SLP ? -Continue CIR therapies including PT, OT, and SLP  - team conf in am  ?2.  Antithrombotics: ?-DVT/anticoagulation:  Pharmaceutical: Lovenox ?            -antiplatelet therapy: N/A due to bleed ?3. Pain Management: tylenol prn.  ?4. Mood: LCSW to follow for evaluation and support when appropriate.  ?            -antipsychotic agents: N?A ?5. Neuropsych: This patient is not fully capable of making decisions on his own behalf. ?6. Skin/Wound Care: Routine pressure relief measures.  ?7. Fluids/Electrolytes/Nutrition: Monitor I/O. Check CMET on Monday ?8.  HTN: Monitor BP TID- continue Catapres, Norvasc, Coreg, hydralazine and HCTZ, and spironolactone\ ? -4/3 DBP still elevated. Increase hydralazine to q6 ?           ?Vitals:  ? 11/24/21 0419 11/24/21 0601  ?BP: (!) 146/87 (!) 155/91  ?Pulse: 70   ?Resp: 18   ?Temp: 98.4 ?F (36.9 ?C)   ?SpO2: 96%   ? ? ?9. Severe dysphagia due to CVA : Continue NPO with tube feeds for nutritional support. ?            -PEG functioning well tolerating TF, on bolus feeds ?10. Acute on chronic renal failure: SCr 1.4 at baseline?-->1.78 3/25 ?            --continue to monitor with serial checks.  ?             --added water flushes.  ?  ? ?  Latest Ref Rng & Units 11/22/2021  ?  6:56 AM 11/16/2021  ?  7:07 AM 11/13/2021  ?  4:09 AM  ?BMP  ?Glucose 70 - 99 mg/dL 119  122   100    ?BUN 6 - 20 mg/dL 36   44   41    ?Creatinine 0.61 - 1.24 mg/dL 1.40   1.47   1.78    ?Sodium 135 - 145 mmol/L 136   138   140    ?Potassium 3.5 - 5.1 mmol/L 3.7   3.7   3.9    ?Chloride 98 - 111 mmol/L 102   102   103    ?CO2 22 - 32 mmol/L 26   26   27     ?Calcium 8.9 - 10.3 mg/dL 9.1   9.2   9.1    ? ?11. Polycythemia: Hgb 19.4, JAK2 genetic testing still pending.  ? 3/27- Hb 17.0- con't to monitor ?12. Urinary retention: cimproved s/p foley removal  ?  ?13. 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 Flash pulmonary edema/acute respiratory failure: Weaned off BIPAP by 03/22 ?            --Monitor daily wts  down from 141-->137 kg. ?Cardiac MRI showed normal RIght and Left heart function  ?16.  Metatarsalgia RIght foot added voltaren gel and foot orthosis- improved , standing without c/o pain  ? ?17.  Upper resp congestion due to Dysphagia, doing better with NGT removed  ? ? ?LOS: ?11 days ?A FACE TO FACE EVALUATION WAS PERFORMED ? ?Luanna Salk Laini Urick ?11/24/2021, 8:48 AM  ? ?  ?

## 2021-11-24 NOTE — Progress Notes (Signed)
Pts mother at bedside for education at this time. Went over PEG assessment, medication and tube feeding administration. Pt and mother voiced understanding. All questions answered. Pts mother stated that she will try to come back tomorrow for more training.  ?

## 2021-11-24 NOTE — Progress Notes (Signed)
Patient ID: Paul [name], male   DOB: 29-Oct-1985, 36 y.o.   MRN: [id number] ?Team Conference Report to Patient/Family ? ?Team Conference discussion was reviewed with the patient and caregiver, including goals, any changes in plan of care and target discharge date.  Patient and caregiver express understanding and are in agreement.  The patient has a target discharge date of 11/30/21. ? ?SW spoke with patient mother and provided conference updates. The patient will d/c on original d/c of 4/11 vs. 4/7. Patient mother will be present for family education today.  ? ?[name] ?11/24/2021, 1:25 PM  ?

## 2021-11-24 NOTE — Progress Notes (Signed)
Physical Therapy Session Note ? ?Patient Details  ?Name: Paul [name] ?MRN: [id number] ?Date of Birth: Aug 17, 1986 ? ?Today's Date: 11/24/2021 ?PT Individual Time: 3382-5053 ?PT Individual Time Calculation (min): 60 min  ? ?Short Term Goals: ?Week 2:  PT Short Term Goal 1 (Week 2): = to LTGs based on ELOS ? ?Skilled Therapeutic Interventions/Progress Updates:  ?  Pt received sitting in w/c with his dad present and pt agreeable to therapy session. Pt's mother not present for education as scheduled - SW made aware.  ? ?Extensive amount of time discussing D/C recommendations with pt and his father. Pt and his father confirmed that pt's mother has MS and uses a wheelchair for all mobility, pt's older sister is available PRN as she works full time on 3rd shift, and then pt has 2 nephews that are ~13y.o. and ~20y.o. and not necessarily appropriate to provide patient support. Pt and father confirm that pt's home is level entry and all on 1 floor. Pt and family educated on therapist's recommendation to stay until planned D/C date of 4/11 especially given pt's limited family support and being uninsured decreasing availability of follow-up therapy options - pt's preference is to D/C on 4/7; however, with whole team recommendation (including MD) pt may be agreeable to stay - will continue to discuss this tomorrow.  ? ?Pt's father present throughout session.  ? ?Sit<>stands, no AD, with close supervision/CGA during session.  ? ?Gait training ~134ft to main therapy gym, no AD, with light min assist for balance - continues to demo decreased R LE foot clearance during swing (though improving) - achieves reciprocal stepping pattern - decreased gait speed.  ? ?Donned 4lb ankle weight on R LE. ? ?Dynamic gait training using agility ladder ?- forward reciprocal stepping pattern x4 reps with min assist for balance and cuing for increased R hip/knee flexion during swing to clear foot over reins of ladder and avoid circumduction  compensation - cuing to sustain L weight shift on L LE longer to improve R foot clearance ?- side stepping x3 reps with min assist for balance, cuing for increased hip/knee flexion to step over ladder rein  ? ?Gait training ~15ft back to room, no AD, while still wearing R LE ankle weight with focus on increased neural recruitment - requires more consistent verbal cuing to clear R LE during swing. ? ?At end of session, pt left supine in bed with needs in reach, bed alarm on, and his father present. ? ?Therapy Documentation ?Precautions:  ?Precautions ?Precautions: Fall ?Precaution Comments: right hemi, mild right neglect, cortrak, urinary catheter, BP<160 ?Restrictions ?Weight Bearing Restrictions: No ? ? ?Pain: ?No reports or indications of pain throughout session. ? ? ?Therapy/Group: Individual Therapy ? ?Ginny Forth , PT, DPT, NCS, CSRS ?11/24/2021, 12:57 PM  ?

## 2021-11-24 NOTE — Progress Notes (Addendum)
Occupational Therapy Session Note ? ?Patient Details  ?Name: Paul [name] ?MRN: [id number] ?Date of Birth: [birth year] ? ?Today's Date: 11/24/2021 ?OT Individual Time: 4709-2957 session 1 ?OT Individual Time Calculation (min): 39 min  ?Session 2: 1304-1400 ? ?Short Term Goals: ?Week 2:  OT Short Term Goal 1 (Week 2): pt will recall UB hemi strategies for 2 consecutive OT sessions ?OT Short Term Goal 2 (Week 2): pt will complete LB dressing with MOD A ?OT Short Term Goal 3 (Week 2): pt will complete 1/3 toileting tasks with CGA ?OT Short Term Goal 4 (Week 2): pt will complete bathing with MIN A from shower level ? ?Skilled Therapeutic Interventions/Progress Updates:  ?Session 1: Pt greeted seated in recliner agreeable to OT intervention. Session focus on BADL reeducation, functional mobility, dynamic standing balance and decreasing overall caregiver burden.   ?Pt completed ambulatory shower transfer with no AD and MINA. Pt entered shower with MINA. Pt completed bathing from shower via sit>stand with CGA. Pt with good initiation of using RUE during bathing tasks even able to use LH sponge for LB bathing. Pt exited shower with MINA. Pt completed dressing EOB with MIN A for UB dressing, able to recall hemi technique and MIN A for LB dressing needing assist to thread RLE and pull pants up to waist line on R side via sit>stand. Pt even able to don socks and shoes with set- up assist and increased time and effort using hemi technique.  ?Discussed that pts DC date with written on board incorrectly  with recommendation to stay until 4/11 as pt will not get f/u therapy as pt is uninsured. Pt did not respond to this recommendation. pt left seated supine in bed with bed alarm activated and all needs within reach.  ?                   ?Session 2:  ?Plan for family ed this session; however family not present during session ?Pt greeted seated in recliner agreeable to OT intervention. Session focus on functional mobility, dynamic  standing balance, RUE coordination and decreasing overall caregiver burden.    ?Pt completed functional mobility from room to gym with rw and CGA. Pt completed various therapeutic activities to promote improved [facility] starting with pt using popit to work on finger insolation. Pt unable to isolate single digits with pt more so placing hand on pop it and then working on digit extension to pop each button.  ?Additionally worked on Environmental consultant for ADL participation with pt flipping over blocks with RUE completing task with increased time and effort with RUE, utilized dycem to stabilize blocks during task. Pt noted to become frustrated with task needing rest breaks in between. ?Also worked on functional reaching in standing with RUE with pt instructed to reach for clothespins with RUE and place on basketball net with RUE, pt using pincer grasp to retrieve pins and needed assist to stabilize net when placing pins on netting. Pt also able to reach for larger beach ball on R side and toss into basketball hoop from standing with CGA. Pt completed functional ambulation back to room with and CGA. Pt left seated in recliner with alarm belt activated and all needs within reach.              ?Continued to reiterate during session that therapy's recommendation is for pt to stay until 4/11 with pt continuing to prefer to leave on 4/7         ? ? ?  Therapy Documentation ?Precautions:  ?Precautions ?Precautions: Fall ?Precaution Comments: right hemi, mild right neglect, cortrak, urinary catheter, BP<160 ?Restrictions ?Weight Bearing Restrictions: No ? ?Pain: ?Session 1: no pain reported during session  ?Session 2: no pain reported  ? ? ? ?Therapy/Group: Individual Therapy ? ?[name] ?11/24/2021, 10:39 AM ?

## 2021-11-24 NOTE — Progress Notes (Signed)
Speech Language Pathology Daily Session Note ? ?Patient Details  ?Name: Paul [name] ?MRN: [id number] ?Date of Birth: [birth year] ? ?Today's Date: 11/24/2021 ?SLP Individual Time: [id number] ?SLP Individual Time Calculation (min): 45 min ? ?Short Term Goals: ?Week 2: SLP Short Term Goal 1 (Week 2): STG=LTG due to ELOS ? ?Skilled Therapeutic Interventions: Skilled ST treatment focused on swallowing goals and education with patient's father. Pt's mother unable to be present for education this date. SLP facilitated education on dysphagia, severe dysarthria/anarthria, oral care measures, demonstration of suction kit set-up, NPO status, and recommendations for follow up therapy in an outpatient setting if possible despite pt being uninsured. Pt's father verbalized understanding through teach back and all questions were addressed at this time. Patient completed oral care using suction toothbrush following set-up A. Pt consumed 1/2 tsp of honey thick liquids and 1/2-1 tsp of puree with decreased oral manipulation, AP transit delay; pt continues to tilt head upward with pureed textures to transition bolus prior to swallow initiation, min anterior labial spillage, mild oral residuals, and intermittent delayed cough and throat clearing. Oral transit appeared more timely with puree vs. HTL. Pt also stated "the applesauce is easier." Pt continuing to require multiple swallows with each trial independent of texture. Per observations, second swallow was suspected to be initiated with improved timeliness. Oral care performed once more following conclusion of PO trials. SLP continues to recommend NPO status with alternate means of nutrition via PEG. Continue PO trials with ice chips, thin liquids (water only) by tsp, puree, and honey thick liquids; keep water separate from puree and HTLs. Pt indicated understanding via head nod and thumbs up. Patient was left in recliner with alarm activated and immediate needs within reach at end of  session. Father remained at bedside. Continue per current plan of care.   ?   ? ?Pain ?Pain Assessment ?Pain Scale: 0-10 ?Pain Score: 0-No pain ? ?Therapy/Group: Individual Therapy ? ?Paul [name] T Britton Bera ?11/24/2021, 4:46 PM ?

## 2021-11-24 NOTE — Patient Care Conference (Signed)
Inpatient RehabilitationTeam Conference and Plan of Care Update ?Date: 11/24/2021   Time: 10:34 AM  ? ? ?Patient Name: Paul [name]      ?Medical Record Number: [id number]  ?Date of Birth: November 01, 1985 ?Sex: Male         ?Room/Bed: 4W06C/4W06C-01 ?Payor Info: Payor: /   ? ?Admit Date/Time:  11/13/2021  3:01 PM ? ?Primary Diagnosis:  Nontraumatic acute hemorrhage of basal ganglia (HCC) ? ?Hospital Problems: Principal Problem: ?  Nontraumatic acute hemorrhage of basal ganglia (HCC) ? ? ? ?Expected Discharge Date: Expected Discharge Date: 11/30/21 ? ?Team Members Present: ?Physician leading conference: Dr. Alysia Penna ?Social Worker Present: Erlene Quan, BSW ?Nurse Present: Dorien Chihuahua, RN ?PT Present: Page Spiro, PT ?OT Present: [name], COTA ?SLP Present: Sherren Kerns, SLP ?PPS Coordinator present : Gunnar Fusi, SLP ? ?   Current Status/Progress Goal Weekly Team Focus  ?Bowel/Bladder ? ?   Continent of bowel and bladder        ?Swallow/Nutrition/ Hydration ? ? NPO; min A  min A for dysphagia therapy  oral care, PO trials with ice chips and thin liquids by 1/2 tsp, oral exercises   ?ADL's ? ? pt completed ambulatory shower transfer this week with no AD and MINA, bathing from shower seat with MINA, able to initiate using RUE during bathing with no cues. MIN A for UB dressing, MIN A for LB dressing. MIN A for 3/3 toileting tasks.  set- up [place] coordination/ functional integration, functional mobility, dynamic standing balance, family education   ?Mobility ? ? CGA/supervision bed mobility, CGA sit<>stands, min assist stand pivot transfers w/o AD, gait up to 871f min assist w/o AD stil having poor R foot clearance during swing and slight knee flexion "bob" during stance with decreased sustained quad activation, min assist 12 steps using B HRs - still high fall risk with score of 36/56 on Berg Balance Scale  mod-I/supervision at ambulatory level  pt education, transfer training, R hemibody  NMR, gait training using LRAD, dynamic standing balance, stair navigation training, activity tolerance   ?Communication ? ? mod A for verbal expression; sup A for multimodal communication  min A  oral motor movements and phoneme production   ?Safety/Cognition/ Behavioral Observations ?           ?Pain ? ?   N/A ?        ?Skin ? ?   PEG   Peg site clean and dry   Education on PEG site care with patient and mother  ? ? ?Discharge Planning:  ?discharging home on Friday with mother. Family education tomorrow 1-4   ?Team Discussion: ?MRI WNL per MD. Tolerating bolus feedings well; ongoing education for care/nutrition with patient and mother.  ?Patient on target to meet rehab goals: ?yes, currently completes shower transfers with min assist and bathing with CGA. Good coordination with assistive devices. Needs min assist for upper body care, lower body dressing and toileting. Needs CGA for sit - stand and stand pivot without the RW. Ambulates with min assist due to right foot clearance issues and delayed balance along the 12 steps.  Patient with oral motor issues, swallowing delays and overt signs and symptoms of aspiration. Goals for discharge set for CGA overall and supervision - mod I for transfers. ? ?*See Care Plan and progress notes for long and short-term goals.  ? ?Revisions to Treatment Plan:  ?Working on grip, and phonating for functional speech  ? ?Teaching Needs: ?Safety, transfers, toileting, medications,  PEG care/nutrtional means, etc  ?Current Barriers to Discharge: ?Decreased caregiver support and lack of insurance for follow up services ? ?Possible Resolutions to Barriers: ?Family education 11/24/21 ?24/7 supervision ?OP follow up services ?Suction machine/kit ?DME: has a TTB ?  ? ? Medical Summary ?Current Status: Now has PEG and is on bolus feeds, RUE weakness, aphasic ? Barriers to Discharge: Medical stability;Nutrition means ?  ?Possible Resolutions to [employer] Focus: Needs training for bolus  feeds, ? if RUE fxn is sufficient for TF administration, work on communication ? ? ?Continued Need for Acute Rehabilitation Level of Care: The patient requires daily medical management by a physician with specialized training in physical medicine and rehabilitation for the following reasons: ?Direction of a multidisciplinary physical rehabilitation program to maximize functional independence : Yes ?Medical management of patient stability for increased activity during participation in an intensive rehabilitation regime.: Yes ?Analysis of laboratory values and/or radiology reports with any subsequent need for medication adjustment and/or medical intervention. : Yes ? ? ?I attest that I was present, lead the team conference, and concur with the assessment and plan of the team. ? ? ?[name] ?11/24/2021, 2:41 PM  ? ? ? ? ? ? ?

## 2021-11-24 NOTE — Discharge Instructions (Addendum)
Inpatient Rehab Discharge Instructions ? ?Paul [name] ?Discharge date and time:  11/30/21 ? ?Activities/Precautions/ Functional Status: ?Activity: no lifting, driving, or strenuous exercise till cleared by MD ?Diet: Liquid diet--can use low carb supplements.  ?Wound Care: wash around PEG site with soap and water, pat dry and keep dry. Contact Radiology if you develop any problems with your PEG tube or area round it --redness, swelling, increase in pain, drainage.    ? ? ?Functional status:  ?___ No restrictions     ___ Walk up steps independently ?_X__ 24/7 supervision/assistance   ___ Walk up steps with assistance ?___ Intermittent supervision/assistance  ___ Bathe/dress independently ?_X__ Walk with walker    ___ Bathe/dress with assistance ?___ Walk Independently    ___ Shower independently ?___ Walk with assistance    _X__ Shower with assistance ?_X__ No alcohol     ___ Return to work/school ________ ? ?COMMUNITY REFERRALS UPON DISCHARGE:   ? ?Medical Equipment/Items Ordered: Suction Kit, Rolling Walker ?                                                Agency/Supplier: [employer]  [phone] ? ? ? ?Special Instructions: ? ? ?STROKE/TIA DISCHARGE INSTRUCTIONS ?SMOKING Cigarette smoking nearly doubles your risk of having a stroke & is the single most alterable risk factor  ?If you smoke or have smoked in the last 12 months, you are advised to quit smoking for your health. Most of the excess cardiovascular risk related to smoking disappears within a year of stopping. ?Ask you doctor about anti-smoking medications ?[employer] Quit Line: 1-800-QUIT NOW ?Free Smoking Cessation Classes [phone]  ?CHOLESTEROL Know your levels; limit fat & cholesterol in your diet  ?Lipid Panel  ?   ?Component Value Date/Time  ? CHOL 229 (H) 11/07/2021 0055  ? CHOL 177 10/15/2020 1640  ? TRIG 112 11/12/2021 0205  ? HDL 51 11/07/2021 0055  ? HDL 38 (L) 10/15/2020 1640  ? CHOLHDL 4.5 11/07/2021 0055  ? VLDL 42 (H) 11/07/2021 0055  ? Kelly  136 (H) 11/07/2021 0055  ? LDLCALC 125 (H) 10/15/2020 1640  ? ? ? Many patients benefit from treatment even if their cholesterol is at goal. ?Goal: Total Cholesterol (CHOL) less than 160 ?Goal:  Triglycerides (TRIG) less than 150 ?Goal:  HDL greater than 40 ?Goal:  LDL (LDLCALC) less than 100 ?  ?BLOOD PRESSURE American Stroke Association blood pressure target is less that 120/80 mm/Hg  ?Your discharge blood pressure is:  BP: (!) 152/86 Monitor your blood pressure ?Limit your salt and alcohol intake ?Many individuals will require more than one medication for high blood pressure  ?DIABETES (A1c is a blood sugar average for last 3 months) Goal HGBA1c is under 7% (HBGA1c is blood sugar average for last 3 months)  ?Diabetes: ?No known diagnosis of diabetes   ? ?Lab Results  ?Component Value Date  ? HGBA1C 5.1 11/07/2021  ? ? Your HGBA1c can be lowered with medications, healthy diet, and exercise. ?Check your blood sugar as directed by your physician ?Call your physician if you experience unexplained or low blood sugars.  ?PHYSICAL ACTIVITY/REHABILITATION Goal is 30 minutes at least 4 days per week  ?Activity: No driving, ?Therapies: see above ?Return to work: N/a Activity decreases your risk of heart attack and stroke and makes your heart stronger.  It helps control your weight  and blood pressure; helps you relax and can improve your mood. ?Participate in a regular exercise program. ?Talk with your doctor about the best form of exercise for you (dancing, walking, swimming, cycling).  ?DIET/WEIGHT Goal is to maintain a healthy weight  ?Your discharge diet is:  ?Diet Order   ? ?       ?  Diet clear liquid Room service appropriate? Yes; Fluid consistency: Thin  Diet effective now       ?  ? ?  ?  ? ?  ?  liquids ?Your height is:  Height: '6\' 3"'  (190.5 cm) ?Your current weight is: Weight: 128.3 kg ?Your Body Mass Index (BMI) is:  BMI (Calculated): 35.35 Following the type of diet specifically designed for you will help  prevent another stroke. ?Your goal weight is: 200 lbs ?Your goal Body Mass Index (BMI) is 19-24. ?Healthy food habits can help reduce 3 risk factors for stroke:  High cholesterol, hypertension, and excess weight.  ?RESOURCES Stroke/Support Group:  Call [phone] ?  ?STROKE EDUCATION PROVIDED/REVIEWED AND GIVEN TO PATIENT Stroke warning signs and symptoms ?How to activate emergency medical system (call 911). ?Medications prescribed at discharge. ?Need for follow-up after discharge. ?Personal risk factors for stroke. ?Pneumonia vaccine given:  ?Flu vaccine given:  ?My questions have been answered, the writing is legible, and I understand these instructions.  I will adhere to these goals & educational materials that have been provided to me after my discharge from the hospital.  ? ?  ?My questions have been answered and I understand these instructions. I will adhere to these goals and the provided educational materials after my discharge from the hospital. ? ?Patient/Caregiver Signature _______________________________ Date __________ ? ?Clinician Signature _______________________________________ Date __________ ? ?Please bring this form and your medication list with you to all your follow-up doctor's appointments.   ?

## 2021-11-25 LAB — GLUCOSE, CAPILLARY
Glucose-Capillary: 105 mg/dL — ABNORMAL HIGH (ref 70–99)
Glucose-Capillary: 113 mg/dL — ABNORMAL HIGH (ref 70–99)
Glucose-Capillary: 116 mg/dL — ABNORMAL HIGH (ref 70–99)
Glucose-Capillary: 151 mg/dL — ABNORMAL HIGH (ref 70–99)
Glucose-Capillary: 89 mg/dL (ref 70–99)
Glucose-Capillary: 96 mg/dL (ref 70–99)

## 2021-11-25 MED ORDER — CLONIDINE HCL 0.1 MG PO TABS
0.2000 mg | ORAL_TABLET | Freq: Three times a day (TID) | ORAL | Status: DC
  Administered 2021-11-25 – 2021-11-30 (×15): 0.2 mg
  Filled 2021-11-25 (×15): qty 2

## 2021-11-25 MED ORDER — HYDRALAZINE HCL 50 MG PO TABS
75.0000 mg | ORAL_TABLET | Freq: Three times a day (TID) | ORAL | Status: DC
  Administered 2021-11-25 – 2021-11-30 (×14): 75 mg
  Filled 2021-11-25 (×14): qty 1

## 2021-11-25 NOTE — Progress Notes (Addendum)
?                                                       PROGRESS NOTE ? ? ?Subjective/Complaints: ? ?No issues overnite , has not learn TF bolusing yet ?Discussed difficulty with BP regulation and additional changes that will be made today but will take several days to take effect  ?ROS: limited due to language/communication  ? ? ?Objective: ?  ?No results found. ?No results for input(s): WBC, HGB, HCT, PLT in the last 72 hours. ? ?No results for input(s): NA, K, CL, CO2, GLUCOSE, BUN, CREATININE, CALCIUM in the last 72 hours. ? ? ?Intake/Output Summary (Last 24 hours) at 11/25/2021 0915 ?Last data filed at 11/25/2021 4081 ?Gross per 24 hour  ?Intake 1200 ml  ?Output 800 ml  ?Net 400 ml  ? ?  ? ?  ? ?Physical Exam: ?Vital Signs ?Blood pressure (!) 159/83, pulse 65, temperature 98.1 ?F (36.7 ?C), temperature source Oral, resp. rate 17, height 6\' 3"  (1.905 m), weight 128.3 kg, SpO2 99 %. ? ? ? ?General: No acute distress ?Mood and affect are appropriate ?Heart: Regular rate and rhythm no rubs murmurs or extra sounds ?Lungs: Clear to auscultation, breathing unlabored, no rales or wheezes ?Abdomen: Positive bowel sounds, soft nontender to palpation, nondistended ?Extremities: No clubbing, cyanosis, or edema ? ? ?Skin: No evidence of breakdown, no evidence of rash ?Neurologic: Cranial nerves II through XII intact, motor strength is 5/5 in left 4/5 RIght deltoid, bicep, tricep, grip, hip flexor, knee extensors, ankle dorsiflexor and plantar flexor with inconsistent activtion. Aphasic apraxic ?Sensory exam normal for light touch and pain in all 4 limbs. No limb ataxia or cerebellar signs. No abnormal tone appreciated.   ?Musculoskeletal: Full range of motion in all 4 extremities. No joint swelling ? ? ? ?Assessment/Plan: ?1. Functional deficits which require 3+ hours per day of interdisciplinary therapy in a comprehensive inpatient rehab setting. ?Physiatrist is providing close team supervision and 24 hour management of  active medical problems listed below. ?Physiatrist and rehab team continue to assess barriers to discharge/monitor patient progress toward functional and medical goals ? ?Care Tool: ? ?Bathing ? Bathing activity did not occur: Safety/medical concerns ?Body parts bathed by patient: Right arm, Chest, Abdomen, Front perineal area, Left arm, Right upper leg, Left upper leg, Right lower leg, Left lower leg, Face, Buttocks  ? Body parts bathed by helper: Buttocks ?  ?  ?Bathing assist Assist Level: Contact Guard/Touching assist (sit>stand in shower) ?  ?  ?Upper Body Dressing/Undressing ?Upper body dressing   ?What is the patient wearing?: Pull over shirt ?   ?Upper body assist Assist Level: Minimal Assistance - Patient > 75% ?   ?Lower Body Dressing/Undressing ?Lower body dressing ? ? ?   ?What is the patient wearing?: Pants ? ?  ? ?Lower body assist Assist for lower body dressing: Minimal Assistance - Patient > 75% ?   ? ?Toileting ?Toileting    ?Toileting assist Assist for toileting: Minimal Assistance - Patient > 75% (standing at toilet) ?  ?  ?Transfers ?Chair/bed transfer ? ?Transfers assist ? Chair/bed transfer activity did not occur: Safety/medical concerns ? ?Chair/bed transfer assist level: Contact Guard/Touching assist ?Chair/bed transfer assistive device: Other (no device) ?  ?Locomotion ?Ambulation ? ? ?Ambulation assist ? ?   ? ?Assist  level: Minimal Assistance - Patient > 75% ?Assistive device: No Device ?Max distance: 265ft  ? ?Walk 10 feet activity ? ? ?Assist ?   ? ?Assist level: Minimal Assistance - Patient > 75% ?Assistive device: No Device  ? ?Walk 50 feet activity ? ? ?Assist Walk 50 feet with 2 turns activity did not occur: Safety/medical concerns ? ?Assist level: Minimal Assistance - Patient > 75% ?Assistive device: No Device  ? ? ?Walk 150 feet activity ? ? ?Assist Walk 150 feet activity did not occur: Safety/medical concerns ? ?Assist level: Minimal Assistance - Patient > 75% ?Assistive device:  No Device ?  ? ?Walk 10 feet on uneven surface  ?activity ? ? ?Assist Walk 10 feet on uneven surfaces activity did not occur: Safety/medical concerns ? ? ?  ?   ? ?Wheelchair ? ? ? ? ?Assist Is the patient using a wheelchair?: Yes ?Type of Wheelchair: Manual ?  ? ?Wheelchair assist level: Minimal Assistance - Patient > 75% ?Max wheelchair distance: 15 ft  ? ? ?Wheelchair 50 feet with 2 turns activity ? ? ? ?Assist ? ?  ?  ? ? ?Assist Level: Total Assistance - Patient < 25%  ? ?Wheelchair 150 feet activity  ? ? ? ?Assist ?   ? ? ?Assist Level: Total Assistance - Patient < 25%  ? ?Blood pressure (!) 159/83, pulse 65, temperature 98.1 ?F (36.7 ?C), temperature source Oral, resp. rate 17, height 6\' 3"  (1.905 m), weight 128.3 kg, SpO2 99 %. ? ?Medical Problem List and Plan: ?1. Functional deficits secondary to left basal ganglia ICH ?            -patient may shower ?            -ELOS/Goals: 4/11 supervision with PT,OT and sup/min with SLP ? -Continue CIR therapies including PT, OT, and SLP  - team conf in am  ?2.  Antithrombotics: ?-DVT/anticoagulation:  Pharmaceutical: Lovenox ?            -antiplatelet therapy: N/A due to bleed ?3. Pain Management: tylenol prn.  ?4. Mood: LCSW to follow for evaluation and support when appropriate.  ?            -antipsychotic agents: N?A ?5. Neuropsych: This patient is not fully capable of making decisions on his own behalf. ?6. Skin/Wound Care: Routine pressure relief measures.  ?7. Fluids/Electrolytes/Nutrition: Monitor I/O. Check CMET on Monday ?8.  HTN: Monitor BP TID- continue Catapres convert back to po , Norvasc 10mg , Coreg 25mg  BID , hydralazine 50mg  QID will change to 75 TID and HCTZ 25mg , and spironolactone 50mg   ? -4/3 DBP still elevated. Increase hydralazine to q6 ?           ?Vitals:  ? 11/25/21 0312 11/25/21 0530  ?BP: (!) 145/85 (!) 159/83  ?Pulse: 65   ?Resp: 17   ?Temp: 98.1 ?F (36.7 ?C)   ?SpO2: 99%   ? ? ?9. Severe dysphagia due to CVA : Continue NPO with tube feeds  for nutritional support. ?            -PEG functioning well tolerating TF, on bolus feeds ?10. Acute on chronic renal failure: SCr 1.4 at baseline?-->1.78 3/25 ?            --continue to monitor with serial checks.  ?            --added water flushes.  ?  ? ?  Latest Ref Rng & Units 11/22/2021  ?  6:56 AM  11/16/2021  ?  7:07 AM 11/13/2021  ?  4:09 AM  ?BMP  ?Glucose 70 - 99 mg/dL 409119   811122   914100    ?BUN 6 - 20 mg/dL 36   44   41    ?Creatinine 0.61 - 1.24 mg/dL 7.821.40   9.561.47   2.131.78    ?Sodium 135 - 145 mmol/L 136   138   140    ?Potassium 3.5 - 5.1 mmol/L 3.7   3.7   3.9    ?Chloride 98 - 111 mmol/L 102   102   103    ?CO2 22 - 32 mmol/L 26   26   27     ?Calcium 8.9 - 10.3 mg/dL 9.1   9.2   9.1    ? ?11. Polycythemia: Hgb 19.4, JAK2 genetic testing still pending.  ? 3/27- Hb 17.0- con't to monitor ?12. Urinary retention: cimproved s/p foley removal  ?  ?13. 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 Flash pulmonary edema/acute respiratory failure: Weaned off BIPAP by 03/22 ?            --Monitor daily wts  down from 141-->137 kg. ?Cardiac MRI showed normal RIght and Left heart function  ?16.  Metatarsalgia RIght foot added voltaren gel and foot orthosis- improved , standing without c/o pain  ? ?17.  Upper resp congestion due to Dysphagia, doing better with NGT removed  ? ? ?LOS: ?12 days ?A FACE TO FACE EVALUATION WAS PERFORMED ? ?Victorino Sparrowndrew E Chesnie Capell ?11/25/2021, 9:15 AM  ? ?  ?

## 2021-11-25 NOTE — Progress Notes (Signed)
Occupational Therapy Session Note ? ?Patient Details  ?Name: Paul [name] ?MRN: [id number] ?Date of Birth: 12/25/1985 ? ?Today's Date: 11/25/2021 ?OT Individual Time: 1041-1100 ?OT Individual Time Calculation (min): 19 min  session 1 ?Pt missed 11 mins as OTA running late from previous session  ?Session 2: [id number] ( 51 MINS) ? ? ?Short Term Goals: ?Week 2:  OT Short Term Goal 1 (Week 2): pt will recall UB hemi strategies for 2 consecutive OT sessions ?OT Short Term Goal 2 (Week 2): pt will complete LB dressing with MOD A ?OT Short Term Goal 3 (Week 2): pt will complete 1/3 toileting tasks with CGA ?OT Short Term Goal 4 (Week 2): pt will complete bathing with MIN A from shower level ? ?Skilled Therapeutic Interventions/Progress Updates:  ?Session 1: Pt greeted supine in bed, agreeable to OT intervention. Session focus on RUE cooridnaton. Pt instructed to use RUE to retrieve small complinat cubes and place on L side of table with an emphasis on RUE propriocpetion/ motor planning and faciliating functional pincer grasp. Pt completed task with overall supervision. Did practice simulated bolus feeding task with pouring liquid soap into cup with RUE holding cup and LUE pouring, plan to practice this task more as pt will have to complete this task at home. Pt left seated EOB ( all needs within reach) working on grasping small pieces of putty, squeezing putty between first digit and thumb and then placing in container.                ? ?Session 2 : Pt greeted supine in bed agreeable to OT intervention. Session focus on RUE motor planning/FMC, functional mobility, dynamic standing balance and decreasing overall caregiver burden. Pt completed supine>sit with CGA, total A to don shoes from EOB for time mgmt. Pt completed functional ambulation from room to day room with Rw and CGA- MIN A, MIN verbal cues to improve functional swing in RLE during gait.  ?Pt engaged in seated therapeutic activity of dying easter eggs with  emphasis on wrist supination/pronation, digit flexion/extension, shoulder internal/external rotation. Donned built up handle onto plastic spoon to improve functional grasp on spoon. Pt able to place egg on spoon with L hand and place egg into dye with MIN hand over hand assist from OTA while RUE grasped spoon. Worked on stirring spoon with RUE with supervision, pt using gross grasp on spoon. Pt needed MIN hand over hand assist to retrieve egg form cup and return to table. ?Next pt ambulated to EOM with Rw and MIN A. Remainder of session to work on various therapeutic activities foucsed on functional reach with RUE and [facility] with pt instructed to retrieve level 1 clothespins from line with RUE.pt completed task in standing with CGA and MIN verbal cues for sequencing task as pt trying to pull pin off before having a good grasp on it. Tried to complete same task with level 2 clothespins with pt lacking enough strength in hands to open clothespins. Pt completed functional ambulation back to room with Rw and MINA. Pt reports working on his hand tires him out the most. pt left supine in bed with bed alarm activated and all needs within reach.                      ? ? ?Therapy Documentation ?Precautions:  ?Precautions ?Precautions: Fall ?Precaution Comments: right hemi, mild right neglect, cortrak, urinary catheter, BP<160 ?Restrictions ?Weight Bearing Restrictions: No ? ?Pain: session 1: no pain reported during  task ?Session 2: no pain reported during session  ? ?Therapy/Group: Individual Therapy ? ?[name] ?11/25/2021, 12:42 PM ?

## 2021-11-25 NOTE — Progress Notes (Signed)
Patient ID: Paul [name], male   DOB: 02/25/1986, 36 y.o.   MRN: [id number] ? ?Family education scheduled on Monday, 4/10 1-4 ?

## 2021-11-25 NOTE — Progress Notes (Signed)
Physical Therapy Session Note ? ?Patient Details  ?Name: Paul [name] ?MRN: [id number] ?Date of Birth: March 13, 1986 ? ?Today's Date: 11/25/2021 ?PT Individual Time: 0938-1829 ?PT Individual Time Calculation (min): 61 min  ? ?Short Term Goals: ?Week 2:  PT Short Term Goal 1 (Week 2): = to LTGs based on ELOS ? ?Skilled Therapeutic Interventions/Progress Updates:  ?  Pt received supine, asleep in bed but easily awakens and agreeable to therapy session. Pt continues to remain non-verbal throughout session using either head nods, thumbs up/down, or texting on his phone for communication.  ? ?Noticed pt's PEG tube site has drainage around it - nurse notified and present to assess and clean. ? ?Pt confirmed that the team's recommended D/C date is 4/11 and he is in agreement to stay. ? ?Supine>sitting L EOB, HOB elevated and using bedrail, with supervision and increased time/effort for trunk upright. Sitting EOB donned shoes total assist for time. ? ?Sit<>stands, no AD, with CGA for steadying during session - intermittently would take away pt's L UE support to promote increased R weight shift for R LE NMR during transfers. ? ?Gait training ~131ft to day room, no AD, with CGA/light min assist for steadying - continues to demo poor R foot clearance during swing, slight R knee flexion positioning during stance (no buckle), and decreased L weight shift during L stance - cuing for improvement throughout. ? ?Donned litegait harness. Stepped on/off treadmill, no harness, but using L UE support on handrail with CGA/light min assist - pt leading with R LE on ascent and L LE on descent which is opposite than recommended; however, completed it successfully.  ? ?Performed the following locomotor training trials on treadmill in litegait harness for safety but not providing any balance or body weight support. ?1st: 87min36sec at 0.96mph totaling 153ft - continues to have poor R foot clearance during swing and noted to keep R knee flexed  throughout stance phase with lack of sufficient quad activation (but not buckling) - therapist facilitated increased R knee extension and intermittently would provide bean bag to step over during swing for increased hip/knee flexor activation ?2nd: 87min09sec at 0.71mph totaling 223ft with similar cuing and assist; however, noticed that when pt consistently lands with R heel strike on initial contact he has improved knee extension throughout remainder of stance  ?3rd: 1min09sec at 0.73mph totaling 375ft with therapist consistently providing bean bags as targets to step over during swing to force increased hip/knee flexion for foot clearance while promoting increased step length and heel strike on initial contact which also improved stance phase knee extension ? ?Stepped off treadmill and doffed harness as above. ? ?Gait training ~266ft back to room, no AD, with CGA/light min assist for balance/steadying - pt now demos improved and more consistent R hip/knee flexion for foot clearance during swing resulting in heel strike on initial contact until towards the end of the walk when navigating in room requiring smaller step lengths to navigate around obstacles. ? ?At end of session, pt left supine in bed with needs in reach, his dad arriving, and bed alarm on. ? ?Therapy Documentation ?Precautions:  ?Precautions ?Precautions: Fall ?Precaution Comments: right hemi, mild right neglect, cortrak, urinary catheter, BP<160 ?Restrictions ?Weight Bearing Restrictions: No ? ? ?Pain: ?No indications of pain throughout session. ? ? ?Therapy/Group: Individual Therapy ? ?Ginny Forth , PT, DPT, NCS, CSRS ?11/25/2021, 12:37 PM  ?

## 2021-11-26 LAB — GLUCOSE, CAPILLARY
Glucose-Capillary: 132 mg/dL — ABNORMAL HIGH (ref 70–99)
Glucose-Capillary: 113 mg/dL — ABNORMAL HIGH (ref 70–99)
Glucose-Capillary: 110 mg/dL — ABNORMAL HIGH (ref 70–99)
Glucose-Capillary: 117 mg/dL — ABNORMAL HIGH (ref 70–99)
Glucose-Capillary: 104 mg/dL — ABNORMAL HIGH (ref 70–99)
Glucose-Capillary: 110 mg/dL — ABNORMAL HIGH (ref 70–99)

## 2021-11-26 NOTE — Progress Notes (Signed)
Nutrition Follow-up ? ?DOCUMENTATION CODES:  ? ?Not applicable ? ?INTERVENTION:  ?Continue bolus feeds using Osmolite 1.5 cal formula via PEG at goal volume bolus of 356 ml (1.5 cartons/ARCs) given QID. ?  ?Continue 90 ml Prosource TF BID per tube.  ?  ?Free water flushes of 200 ml q 4 hours. ?  ?Tube feeding regimen provides 2296 kcal, 133 grams of protein, and 2282 ml free water.  ? ?NUTRITION DIAGNOSIS:  ? ?Increased nutrient needs related to acute illness (nontraumatic acute hemorrhage of basal ganglia) as evidenced by estimated needs; ongoing ? ?GOAL:  ? ?Patient will meet greater than or equal to 90% of their needs; met with TF ? ?MONITOR:  ? ?TF tolerance, Skin, Weight trends, Labs, I & O's ? ?REASON FOR ASSESSMENT:  ? ?Consult ?Assessment of nutrition requirement/status ? ?ASSESSMENT:  ? ?Patient is a 36 year old male admitted for left basal ganglia hemorrhage and pulmonary edema after presenting with aphasia. Past medical history includes poorly controlled HTN, TBI, hematoma with VP shunt. ? ?3/25 - admitted to CIR due to functional deficits due to stroke  ?3/26 - SLP evaluation recommending NPO  ?3/31 - PEG placed ? ?Pt continues on NPO status. Pt has been tolerating his bolus tube feeds via PEG well. RD to continue with current orders. Plans for [facility] Monday. Labs and medications reviewed.  ? ?Diet Order:   ?Diet Order   ? ?       ?  Diet NPO time specified  Diet effective midnight       ?  ? ?  ?  ? ?  ? ? ?EDUCATION NEEDS:  ? ?Not appropriate for education at this time ? ?Skin:  Skin Assessment: Reviewed RN Assessment ? ?Last BM:  4/7 ? ?Height:  ? ?Ht Readings from Last 1 Encounters:  ?11/13/21 _0  (1.905 m)  ? ? ?Weight:  ? ?Wt Readings from Last 1 Encounters:  ?11/26/21 128.3 kg  ? ?BMI:  Body mass index is 35.35 kg/m?. ? ?Estimated Nutritional Needs:  ? ?Kcal:  2300-2500 ? ?Protein:  115-130 grams ? ?Fluid:  >/= 2 L/day ? ?Corrin Parker, MS, RD, LDN ?RD pager number/after hours weekend pager  number on Amion. ? ? ?

## 2021-11-26 NOTE — Progress Notes (Signed)
Pt has been practicing PEG tube feeds today with supervision of staff. Pt successfully administered evening Osmolite feed with assistance and minimal cues from this nurse. ?

## 2021-11-26 NOTE — Progress Notes (Signed)
Speech Language Pathology Daily Session Note ? ?Patient Details  ?Name: Paul [name] ?MRN: [id number] ?Date of Birth: 1986/04/11 ? ?Today's Date: 11/26/2021 ?SLP Individual Time: 0623-7628 ?SLP Individual Time Calculation (min): 40 min ? ?Short Term Goals: ?Week 2: SLP Short Term Goal 1 (Week 2): STG=LTG due to ELOS ? ?Skilled Therapeutic Interventions: Skilled treatment session focused on dysphagia and communication goals. Patient performed oral care via the suction toothbrush with set-up assist. Patient consumed 1/2 tsp trials of thin liquids. Patient's swallow response appeared subjectively quicker compared to the last time this SLP treated. However, patient continues to require more than a reasonable amount of time for AP transit, utilizes multiple swallows and demonstrates coughing in 50% of trials. Of note, patient has a constant, wet cough at baseline due to decreased management of secretions. Recommend patient remain NPO with MBS scheduled for Monday to assess swallow function. Throughout session, patient utilized texting and gestures to express wants/needs. Patient able to follow oral-motor commands with extra time and voiced on command to help assess vocal quality in 100% of opportunities. Patient left upright in recliner with alarm on and all needs within reach. Continue with current plan of care.  ?   ? ?Pain ?No/Denies Pain  ? ?Therapy/Group: Individual Therapy ? ?Paul [name] ?11/26/2021, 3:40 PM ?

## 2021-11-26 NOTE — Progress Notes (Signed)
Occupational Therapy Session Note ? ?Patient Details  ?Name: Paul [name] ?MRN: [id number] ?Date of Birth: Jan 07, 1986 ? ?Today's Date: 11/26/2021 ?OT Individual Time: [id number] session 1 ?OT Individual Time Calculation (min): 58 min  ?Session 2: [id number] ? ? ?Short Term Goals: ?Week 2:  OT Short Term Goal 1 (Week 2): pt will recall UB hemi strategies for 2 consecutive OT sessions ?OT Short Term Goal 2 (Week 2): pt will complete LB dressing with MOD A ?OT Short Term Goal 3 (Week 2): pt will complete 1/3 toileting tasks with CGA ?OT Short Term Goal 4 (Week 2): pt will complete bathing with MIN A from shower level ? ?Skilled Therapeutic Interventions/Progress Updates:  ?Session 1: Pt greeted seated in recliner  agreeable to OT intervention. RN retrieved practice tools for practicing tube feeds. Pt able to hold syringe with R hand and pour water into practice syringe with L hand. Pt also demo'ed ability to insert syringe into peg with L hand. Pt even able to use pill crusher using R hand to hold pill crusher and L hand completing precision task. Updated RN and asked her to have pt practice this afternoon during tube feeds.  ?Pt completed functional ambulation to toilet with Rw and CGA, pt completed 3/3 toilet tasks with CGA. Pt ambulated down to therapy gym with rw and CGA. Pt completed seated RUE [place] with an emphasis on composite digit flexion/extension and wrist supination/pronation with pt able to grasp wash cloths with R hand and drop them on R side of table with an emphasis on digit extension. Pt also completed seated bilateral integration task of folding towels, pt needed increased time and MOD verbal cues for sequencing and body mechanics but pt able to fold 4 wash cloths. Education provided of both of these tasks being something he can work on in between therapies, pt nodded understanding. Additionally worked on [employer] with emphasis on  composite digit flexion/extension and wrist supination/pronation.pt  completed task with supervision. Pt ambulated back to room with RW and CGA, pt left seated in recliner with alarm belt activated and all needs within reach.               ? ?Session 2: Pt received asleep in supine easily able to arouse and agreeable to OT intervention. Session focus on BADL reeducation, functional mobility, dynamic standing balance, RUE FMC and decreasing overall caregiver burden.         ?Pt completed ambulatory shower transfer with RW and CGA. Pt entered walk in shower with CGA using grab bars. Pt doffed clothes with supervision from shower seat. Pt completed bathing from shower seat with close supervision able to sit>stand in shower with grab bar to wash buttock, good initiation noted with RUE with pt using AE as needed during bathing such as LH sponge. Pt ambulated from shower to EOB with Rw and CGA. Pt completed dressing from EOB with MIN A for UB dressing, needing MIN verbal cues for hemi technique and MINA for LB dressing to pull pants to waist line on R side.  Pt donned socks with set- up assist. Pt ambulate to gym with Rw and CGA. Pt completed seated [place] task with pt instructed to place pegs in board with RUE. Pt did very well retrieving pegs from bucket ( supervision assist) pt had more difficulty with intrinsic strength to press pegs into board. Pt completed a total of 8 pegs and also able to remove pegs with increased time with supervision using R hand. Pt ambulated  back to room with rw and CGA. Pt left supine in bed with bed alarm activated and all needs within reach.     ?Therapy Documentation ?Precautions:  ?Precautions ?Precautions: Fall ?Precaution Comments: right hemi, mild right neglect, cortrak, urinary catheter, BP<160 ?Restrictions ?Weight Bearing Restrictions: No ? ?Pain: ?Session 1: no pain reported  ?Session 2: no pain reported  ? ?Therapy/Group: Individual Therapy ? ?[name] ?11/26/2021, 12:15 PM ?

## 2021-11-26 NOTE — Progress Notes (Signed)
?                                                       PROGRESS NOTE ? ? ?Subjective/Complaints: ? ?Discussed swallowing with SLP, MBS for Monday  ?Also discussed insurance issues with SW, [employer] application pending but should be approved once family completes application  ?ROS: limited due to language/communication  ? ? ?Objective: ?  ?No results found. ?No results for input(s): WBC, HGB, HCT, PLT in the last 72 hours. ? ?No results for input(s): NA, K, CL, CO2, GLUCOSE, BUN, CREATININE, CALCIUM in the last 72 hours. ? ?No intake or output data in the 24 hours ending 11/26/21 1054 ?  ? ?  ? ?Physical Exam: ?Vital Signs ?Blood pressure 136/89, pulse (!) 58, temperature 98.7 ?F (37.1 ?C), resp. rate 17, height 6\' 3"  (1.905 m), weight 128.3 kg, SpO2 97 %. ? ? ? ?General: No acute distress ?Mood and affect are appropriate ?Heart: Regular rate and rhythm no rubs murmurs or extra sounds ?Lungs: Clear to auscultation, breathing unlabored, no rales or wheezes ?Abdomen: Positive bowel sounds, soft nontender to palpation, nondistended ?Extremities: No clubbing, cyanosis, or edema ? ? ?Skin: No evidence of breakdown, no evidence of rash ?Neurologic: Cranial nerves II through XII intact, motor strength is 5/5 in left 4/5 RIght deltoid, bicep, tricep, grip, hip flexor, knee extensors, ankle dorsiflexor and plantar flexor with inconsistent activtion. Aphasic apraxic ?Sensory exam normal for light touch and pain in all 4 limbs. No limb ataxia or cerebellar signs. No abnormal tone appreciated.   ?Musculoskeletal: Full range of motion in all 4 extremities. No joint swelling ? ? ? ?Assessment/Plan: ?1. Functional deficits which require 3+ hours per day of interdisciplinary therapy in a comprehensive inpatient rehab setting. ?Physiatrist is providing close team supervision and 24 hour management of active medical problems listed below. ?Physiatrist and rehab team continue to assess barriers to discharge/monitor patient progress  toward functional and medical goals ? ?Care Tool: ? ?Bathing ? Bathing activity did not occur: Safety/medical concerns ?Body parts bathed by patient: Right arm, Chest, Abdomen, Front perineal area, Left arm, Right upper leg, Left upper leg, Right lower leg, Left lower leg, Face, Buttocks  ? Body parts bathed by helper: Buttocks ?  ?  ?Bathing assist Assist Level: Contact Guard/Touching assist (sit>stand in shower) ?  ?  ?Upper Body Dressing/Undressing ?Upper body dressing   ?What is the patient wearing?: Pull over shirt ?   ?Upper body assist Assist Level: Minimal Assistance - Patient > 75% ?   ?Lower Body Dressing/Undressing ?Lower body dressing ? ? ?   ?What is the patient wearing?: Pants ? ?  ? ?Lower body assist Assist for lower body dressing: Minimal Assistance - Patient > 75% ?   ? ?Toileting ?Toileting    ?Toileting assist Assist for toileting: Minimal Assistance - Patient > 75% (standing at toilet) ?  ?  ?Transfers ?Chair/bed transfer ? ?Transfers assist ? Chair/bed transfer activity did not occur: Safety/medical concerns ? ?Chair/bed transfer assist level: Contact Guard/Touching assist ?Chair/bed transfer assistive device: Other (no device) ?  ?Locomotion ?Ambulation ? ? ?Ambulation assist ? ?   ? ?Assist level: Minimal Assistance - Patient > 75% ?Assistive device: No Device ?Max distance: 268ft  ? ?Walk 10 feet activity ? ? ?Assist ?   ? ?Assist level: Minimal  Assistance - Patient > 75% ?Assistive device: No Device  ? ?Walk 50 feet activity ? ? ?Assist Walk 50 feet with 2 turns activity did not occur: Safety/medical concerns ? ?Assist level: Minimal Assistance - Patient > 75% ?Assistive device: No Device  ? ? ?Walk 150 feet activity ? ? ?Assist Walk 150 feet activity did not occur: Safety/medical concerns ? ?Assist level: Minimal Assistance - Patient > 75% ?Assistive device: No Device ?  ? ?Walk 10 feet on uneven surface  ?activity ? ? ?Assist Walk 10 feet on uneven surfaces activity did not occur:  Safety/medical concerns ? ? ?  ?   ? ?Wheelchair ? ? ? ? ?Assist Is the patient using a wheelchair?: Yes ?Type of Wheelchair: Manual ?  ? ?Wheelchair assist level: Minimal Assistance - Patient > 75% ?Max wheelchair distance: 15 ft  ? ? ?Wheelchair 50 feet with 2 turns activity ? ? ? ?Assist ? ?  ?  ? ? ?Assist Level: Total Assistance - Patient < 25%  ? ?Wheelchair 150 feet activity  ? ? ? ?Assist ?   ? ? ?Assist Level: Total Assistance - Patient < 25%  ? ?Blood pressure 136/89, pulse (!) 58, temperature 98.7 ?F (37.1 ?C), resp. rate 17, height 6\' 3"  (1.905 m), weight 128.3 kg, SpO2 97 %. ? ?Medical Problem List and Plan: ?1. Functional deficits secondary to left basal ganglia ICH ?            -patient may shower ?            -ELOS/Goals: 4/11 supervision with PT,OT and sup/min with SLP ? -Continue CIR therapies including PT, OT, and SLP   ?2.  Antithrombotics: ?-DVT/anticoagulation:  Pharmaceutical: Lovenox ?            -antiplatelet therapy: N/A due to bleed ?3. Pain Management: tylenol prn.  ?4. Mood: LCSW to follow for evaluation and support when appropriate.  ?            -antipsychotic agents: N?A ?5. Neuropsych: This patient is not fully capable of making decisions on his own behalf. ?6. Skin/Wound Care: Routine pressure relief measures.  ?7. Fluids/Electrolytes/Nutrition: Monitor I/O. Check CMET on Monday ?8.  HTN: Monitor BP TID- continue Catapres convert back to po , Norvasc 10mg , Coreg 25mg  BID , hydralazine 50mg  QID will change to 75 TID and HCTZ 25mg , and spironolactone 50mg   ? -4/3 DBP still elevated. Increase hydralazine to q6 ?           ?Vitals:  ? 11/26/21 0522 11/26/21 0523  ?BP: 136/89 136/89  ?Pulse:  (!) 58  ?Resp:  17  ?Temp:  98.7 ?F (37.1 ?C)  ?SpO2:  97%  ?BP controlled 4/7 ? ?9. Severe dysphagia due to CVA : Continue NPO with tube feeds for nutritional support. ?            -PEG functioning well tolerating TF, on bolus feeds- pt taining self admin ?10. Acute on chronic renal failure: SCr  1.4 at baseline?-->1.78 3/25 ?            --continue to monitor with serial checks.  ?            --added water flushes.  ?  ? ?  Latest Ref Rng & Units 11/22/2021  ?  6:56 AM 11/16/2021  ?  7:07 AM 11/13/2021  ?  4:09 AM  ?BMP  ?Glucose 70 - 99 mg/dL 01/26/22   6/7   4/25    ?BUN 6 -  20 mg/dL 36   44   41    ?Creatinine 0.61 - 1.24 mg/dL 8.291.40   5.621.47   1.301.78    ?Sodium 135 - 145 mmol/L 136   138   140    ?Potassium 3.5 - 5.1 mmol/L 3.7   3.7   3.9    ?Chloride 98 - 111 mmol/L 102   102   103    ?CO2 22 - 32 mmol/L 26   26   27     ?Calcium 8.9 - 10.3 mg/dL 9.1   9.2   9.1    ? ?11. Polycythemia: Hgb 19.4, JAK2 genetic testing still pending.  ? 3/27- Hb 17.0- con't to monitor ?12. Urinary retention:Resolved ?  ?13. 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 Flash pulmonary edema/acute respiratory failure: Weaned off BIPAP by 03/22 ?            --Monitor daily wts  down from 141-->137 kg. ?Cardiac MRI showed normal RIght and Left heart function  ?16.  Metatarsalgia RIght foot added voltaren gel and foot orthosis- Resolved ? ?17.  Upper resp congestion due to Dysphagia, doing better with NGT removed  ? ? ?LOS: ?13 days ?A FACE TO FACE EVALUATION WAS PERFORMED ? ?Victorino Sparrowndrew E Chinedum Vanhouten ?11/26/2021, 10:54 AM  ? ?  ?

## 2021-11-26 NOTE — Progress Notes (Signed)
Physical Therapy Session Note ? ?Patient Details  ?Name: Paul [name] ?MRN: [id number] ?Date of Birth: 05/23/1986 ? ?Today's Date: 11/26/2021 ?PT Individual Time: 0277-4128 ?PT Individual Time Calculation (min): 43 min  ? ?Short Term Goals: ?Week 2:  PT Short Term Goal 1 (Week 2): = to LTGs based on ELOS ? ?Skilled Therapeutic Interventions/Progress Updates:  ?  Patient received sitting up in recliner, agreeable to PT. He denies pain. Ambulatory transfer to wc with RW and CGA. Verbal cues for increased R foot clearance. PT transporting patient in wc to therapy gym for time management and energy conservation. He ambulated 29ft with RW and CGA. Intermittent R flat foot contact with decreased clearance. 2x12 modified D2 R LE to 4" step with 4# ankle weight. Patient ambulating additional 119ft with RW + CGA and 4# ankle weight to R LE. Noted improvement in foot clearance. Patient completing dynamic standing balance and endurance engaging L UE in fine motor task. Close supervision to maintain balance using RW. Patient with ambulatory transfer to bed, per patient request. Bed alarm on, call light within reach, HOB >30*.  ? ?Therapy Documentation ?Precautions:  ?Precautions ?Precautions: Fall ?Precaution Comments: right hemi, mild right neglect, cortrak, urinary catheter, BP<160 ?Restrictions ?Weight Bearing Restrictions: No ? ? ? ? ?Therapy/Group: Individual Therapy ? ?Westley Foots ?Westley Foots, PT, DPT, CBIS ? ?11/26/2021, 7:56 AM  ?

## 2021-11-27 LAB — GLUCOSE, CAPILLARY
Glucose-Capillary: 101 mg/dL — ABNORMAL HIGH (ref 70–99)
Glucose-Capillary: 102 mg/dL — ABNORMAL HIGH (ref 70–99)
Glucose-Capillary: 103 mg/dL — ABNORMAL HIGH (ref 70–99)
Glucose-Capillary: 141 mg/dL — ABNORMAL HIGH (ref 70–99)
Glucose-Capillary: 184 mg/dL — ABNORMAL HIGH (ref 70–99)
Glucose-Capillary: 74 mg/dL (ref 70–99)

## 2021-11-27 NOTE — Progress Notes (Signed)
?                                                       PROGRESS NOTE ? ? ?Subjective/Complaints: ?Going great job practicing PEG deeds as per nursing! ?Working with SLO ?Swallow study on Monday ?Has limb balm at bedside ? ?ROS: Denies pain  ? ? ?Objective: ?  ?No results found. ?No results for input(s): WBC, HGB, HCT, PLT in the last 72 hours. ? ?No results for input(s): NA, K, CL, CO2, GLUCOSE, BUN, CREATININE, CALCIUM in the last 72 hours. ? ? ?Intake/Output Summary (Last 24 hours) at 11/27/2021 1233 ?Last data filed at 11/27/2021 0841 ?Gross per 24 hour  ?Intake --  ?Output 500 ml  ?Net -500 ml  ?  ? ?  ? ?Physical Exam: ?Vital Signs ?Blood pressure (!) 132/92, pulse (!) 58, temperature 98.2 ?F (36.8 ?C), temperature source Oral, resp. rate 18, height 6\' 3"  (1.905 m), weight 128.3 kg, SpO2 100 %. ? ? ? ?General: No acute distress ?Mood and affect are appropriate ?Heart: Bradycardic ?Lungs: Clear to auscultation, breathing unlabored, no rales or wheezes ?Abdomen: Positive bowel sounds, soft nontender to palpation, nondistended ?Extremities: No clubbing, cyanosis, or edema ? ? ?Skin: No evidence of breakdown, no evidence of rash ?Neurologic: Cranial nerves II through XII intact, motor strength is 5/5 in left 4/5 RIght deltoid, bicep, tricep, grip, hip flexor, knee extensors, ankle dorsiflexor and plantar flexor with inconsistent activtion. Aphasic apraxic ?Sensory exam normal for light touch and pain in all 4 limbs. No limb ataxia or cerebellar signs. No abnormal tone appreciated.   ?Musculoskeletal: Full range of motion in all 4 extremities. No joint swelling ? ? ? ?Assessment/Plan: ?1. Functional deficits which require 3+ hours per day of interdisciplinary therapy in a comprehensive inpatient rehab setting. ?Physiatrist is providing close team supervision and 24 hour management of active medical problems listed below. ?Physiatrist and rehab team continue to assess barriers to discharge/monitor patient progress  toward functional and medical goals ? ?Care Tool: ? ?Bathing ? Bathing activity did not occur: Safety/medical concerns ?Body parts bathed by patient: Right arm, Chest, Abdomen, Front perineal area, Left arm, Right upper leg, Left upper leg, Right lower leg, Left lower leg, Face, Buttocks  ? Body parts bathed by helper: Buttocks ?  ?  ?Bathing assist Assist Level: Supervision/Verbal cueing ?  ?  ?Upper Body Dressing/Undressing ?Upper body dressing   ?What is the patient wearing?: Pull over shirt ?   ?Upper body assist Assist Level: Minimal Assistance - Patient > 75% ?   ?Lower Body Dressing/Undressing ?Lower body dressing ? ? ?   ?What is the patient wearing?: Pants ? ?  ? ?Lower body assist Assist for lower body dressing: Minimal Assistance - Patient > 75% ?   ? ?Toileting ?Toileting    ?Toileting assist Assist for toileting: Contact Guard/Touching assist ?  ?  ?Transfers ?Chair/bed transfer ? ?Transfers assist ? Chair/bed transfer activity did not occur: Safety/medical concerns ? ?Chair/bed transfer assist level: Contact Guard/Touching assist ?Chair/bed transfer assistive device: Other (no device) ?  ?Locomotion ?Ambulation ? ? ?Ambulation assist ? ?   ? ?Assist level: Minimal Assistance - Patient > 75% ?Assistive device: No Device ?Max distance: 27425ft  ? ?Walk 10 feet activity ? ? ?Assist ?   ? ?Assist level: Minimal Assistance - Patient >  75% ?Assistive device: No Device  ? ?Walk 50 feet activity ? ? ?Assist Walk 50 feet with 2 turns activity did not occur: Safety/medical concerns ? ?Assist level: Minimal Assistance - Patient > 75% ?Assistive device: No Device  ? ? ?Walk 150 feet activity ? ? ?Assist Walk 150 feet activity did not occur: Safety/medical concerns ? ?Assist level: Minimal Assistance - Patient > 75% ?Assistive device: No Device ?  ? ?Walk 10 feet on uneven surface  ?activity ? ? ?Assist Walk 10 feet on uneven surfaces activity did not occur: Safety/medical concerns ? ? ?  ?    ? ?Wheelchair ? ? ? ? ?Assist Is the patient using a wheelchair?: Yes ?Type of Wheelchair: Manual ?  ? ?Wheelchair assist level: Minimal Assistance - Patient > 75% ?Max wheelchair distance: 15 ft  ? ? ?Wheelchair 50 feet with 2 turns activity ? ? ? ?Assist ? ?  ?  ? ? ?Assist Level: Total Assistance - Patient < 25%  ? ?Wheelchair 150 feet activity  ? ? ? ?Assist ?   ? ? ?Assist Level: Total Assistance - Patient < 25%  ? ?Blood pressure (!) 132/92, pulse (!) 58, temperature 98.2 ?F (36.8 ?C), temperature source Oral, resp. rate 18, height 6\' 3"  (1.905 m), weight 128.3 kg, SpO2 100 %. ? ?Medical Problem List and Plan: ?1. Functional deficits secondary to left basal ganglia ICH ?            -patient may shower ?            -ELOS/Goals: 4/11 supervision with PT,OT and sup/min with SLP ? Continue CIR therapies including PT, OT, and SLP   ?2.  Antithrombotics: ?-DVT/anticoagulation:  Pharmaceutical: Lovenox ?            -antiplatelet therapy: N/A due to bleed ?3. Pain Management: tylenol prn.  ?4. Mood: LCSW to follow for evaluation and support when appropriate.  ?            -antipsychotic agents: N?A ?5. Neuropsych: This patient is not fully capable of making decisions on his own behalf. ?6. Skin/Wound Care: Routine pressure relief measures.  ?7. Fluids/Electrolytes/Nutrition: Monitor I/O. Check CMET on Monday ?8.  HTN: Monitor BP TID- continue Catapres convert back to po , Norvasc 10mg , Coreg 25mg  BID , hydralazine 50mg  QID will change to 75 TID and HCTZ 25mg , and spironolactone 50mg   ? -4/3 DBP still elevated. Increase hydralazine to q6 ?           ?Vitals:  ? 11/26/21 1940 11/27/21 0336  ?BP: 134/77 (!) 132/92  ?Pulse: (!) 52 (!) 58  ?Resp: 18 18  ?Temp: 98.7 ?F (37.1 ?C) 98.2 ?F (36.8 ?C)  ?SpO2: 99% 100%  ?BP controlled 4/7 ? ?9. Severe dysphagia due to CVA : Continue NPO with tube feeds for nutritional support. ?            -PEG functioning well tolerating TF, on bolus feeds- pt taining self admin, doing well  with this. ?10. Acute on chronic renal failure: SCr 1.4 at baseline?-->1.78 3/25 ?            -continue to monitor with serial checks.  ?            --added water flushes.  ?  ? ?  Latest Ref Rng & Units 11/22/2021  ?  6:56 AM 11/16/2021  ?  7:07 AM 11/13/2021  ?  4:09 AM  ?BMP  ?Glucose 70 - 99 mg/dL 01/27/22   6/7  100    ?BUN 6 - 20 mg/dL 36   44   41    ?Creatinine 0.61 - 1.24 mg/dL 2.40   9.73   5.32    ?Sodium 135 - 145 mmol/L 136   138   140    ?Potassium 3.5 - 5.1 mmol/L 3.7   3.7   3.9    ?Chloride 98 - 111 mmol/L 102   102   103    ?CO2 22 - 32 mmol/L 26   26   27     ?Calcium 8.9 - 10.3 mg/dL 9.1   9.2   9.1    ? ?11. Polycythemia: Hgb 19.4, JAK2 genetic testing still pending.  ? 3/27- Hb 17.0- con't to monitor ?12. Urinary retention:Resolved ?  ?13. 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 Flash pulmonary edema/acute respiratory failure: Weaned off BIPAP by 03/22 ?            --Monitor daily wts  down from 141-->137 kg. ?Cardiac MRI showed normal RIght and Left heart function  ?16.  Metatarsalgia RIght foot added voltaren gel and foot orthosis- Resolved ? ?17.  Upper resp congestion due to Dysphagia, doing better with NGT removed  ?18. Bradycardia: monitor HR TID ? ? ?LOS: ?14 days ?A FACE TO FACE EVALUATION WAS PERFORMED ? ?4/22 P Klaryssa Fauth ?11/27/2021, 12:33 PM  ? ?  ?

## 2021-11-27 NOTE — Progress Notes (Signed)
Physical Therapy Session Note ? ?Patient Details  ?Name: Paul [name] ?MRN: [id number] ?Date of Birth: [birth year] ? ?Today's Date: 11/27/2021 ?PT Individual Time: 2035-5974 ?PT Individual Time Calculation (min): 62 min  ? ?Short Term Goals: ?Week 2:  PT Short Term Goal 1 (Week 2): = to LTGs based on ELOS ? ?Skilled Therapeutic Interventions/Progress Updates:  ?  Pt received asleep, supine in bed and upon awakening agreeable to therapy session. Thearpist assessed PEG tube site and noticed gauze around it is saturated in drainage - notified nurse to clean site and put clean gauze - later in session pt reports the site is itchy, notified nurse. Supine>sitting L EOB, HOB flat and not using bedrails, with supervision and increased time/effort. Pt declines wearing tennis shoes today. Therapist reminded pt of scheduled family education on Monday from 1:00-4:15pm to ensure his mother can attend. Sit<>stands using RW with R hand orthosis, pt does excellent job managing R hand placement on/off orthosis throughout session without cuing -  close supervision for safety throughout session.  ? ?Gait training ~2110ft to main therapy gym using RW with close supervision for safety - demos adequate R LE foot clearance (not wearing shoes) during swing and continues to have slight R knee "bob" into flexion during stance although this improves when pt cued to ensure heel strike on initial contact to activate quads. ? ?Stair navigation training ascending/descending 4 steps (6" height) using B HRs with CGA for safety - ascends reciprocally and descends primarily step-to leading with L LE (no R knee instability noted) - cuing to ensure proper R hand placement on handrail, which pt demos improvement in this. ? ?Patient participated in [facility] and demonstrates increased fall risk as noted by score of   48/56; which is a significant improvement compared to 36/56 on 11/23/21.  (<36= high risk for falls, close to 100%; 37-45 significant  >80%; 46-51 moderate >50%; 52-55 lower >25%). Pt educated on results and improvement in score with continued recommendation to use RW for all standing/ambulation upon D/C to increase his safety and independence with functional mobility. ? ?Gait training ~148ft to ortho gym using RW with continued close supervision and verbal cuing as above. ? ?Simulated ambulatory car transfer (tall SUV height) using RW with close supervision for safety.  ? ?Gait training ~32ft up/down ramp using RW with close supervision for safety - pt demos good safety awareness of slowing down gait speed and ensuring control of RW while on this unlevel surface.  ? ?Gait training ~241ft back to pt's room using RW with close supervision and 2x CGA for safety due to minor postural sway. Sit>supine with supervision. Therapist provided pt with Stroke Support Group flyer. Pt becomes emotional/tearful and types "thank you" to therapist about helping in his recovery. Therapist provided emotional support and encouragement. ? ?At end of session, pt left supine in bed with needs in reach, [facility] elevated, and bed alarm on. ? ?Therapy Documentation ?Precautions:  ?Precautions ?Precautions: Fall, Other (comment) ?Precaution Comments: right hemi, mild right inattention, PEG tube ?Restrictions ?Weight Bearing Restrictions: No ? ? ?Pain: ? Denies pain during session. ? ?Balance: ?Balance ?Balance Assessed: Yes ?Standardized Balance Assessment ?Standardized Balance Assessment: Berg Balance Test ?Sharlene Motts Balance Test ?Sit to Stand: Able to stand without using hands and stabilize independently ?Standing Unsupported: Able to stand safely 2 minutes ?Sitting with Back Unsupported but Feet Supported on Floor or Stool: Able to sit safely and securely 2 minutes ?Stand to Sit: Sits safely with minimal use of hands ?  Transfers: Able to transfer safely, minor use of hands ?Standing Unsupported with Eyes Closed: Able to stand 10 seconds safely ?Standing Ubsupported with Feet  Together: Able to place feet together independently and stand 1 minute safely ?From Standing, Reach Forward with Outstretched Arm: Can reach confidently >25 cm (10") ?From Standing Position, Pick up Object from Floor: Able to pick up shoe, needs supervision ?From Standing Position, Turn to Look Behind Over each Shoulder: Looks behind from both sides and weight shifts well ?Turn 360 Degrees: Able to turn 360 degrees safely but slowly ?Standing Unsupported, Alternately Place Feet on Step/Stool: Able to complete 4 steps without aid or supervision ?Standing Unsupported, One Foot in Front: Able to plae foot ahead of the other independently and hold 30 seconds ?Standing on One Leg: Able to lift leg independently and hold equal to or more than 3 seconds ?Total Score: 48 ? ? ?Therapy/Group: Individual Therapy ? ?Ginny Forth , PT, DPT, NCS, CSRS ?11/27/2021, 12:56 PM  ?

## 2021-11-27 NOTE — Discharge Summary (Signed)
Physical Therapy Discharge Summary ? ?Patient Details  ?Name: Paul [name] ?MRN: [id number] ?Date of Birth: 1985-10-10 ? ? ?Patient has met 3 of 9 long term goals due to improved activity tolerance, improved balance, improved postural control, increased strength, increased range of motion, decreased pain, ability to compensate for deficits, functional use of  right upper extremity and right lower extremity, improved attention, improved awareness, and improved coordination.  Patient to discharge at an ambulatory level CGA using RW.  Patient's care partner requires assistance to provide the necessary physical and cognitive assistance at discharge. Sister did participate in family education prior to dc, but declined to complete any hands on training- only observed.  ? ?Reasons goals not met: Patient continues to require CGA for most mobility due to decreased R foot clearance with gait and mild R inattention. Patient also with poor balance strategies to regain balance if needed.  ? ?Recommendation:  ?Patient will benefit from ongoing skilled PT services in outpatient setting to continue to advance safe functional mobility, address ongoing impairments in dynamic balance, gait progressions, and minimize fall risk. ? ?Equipment: ?RW ? ?Reasons for discharge: treatment goals met and discharge from hospital ? ?Patient/family agrees with progress made and goals achieved: Yes ? ?PT Discharge ?Precautions/Restrictions ?Precautions ?Precautions: Fall;Other (comment) ?Precaution Comments: right hemi, mild right inattention, PEG tube ?Restrictions ?Weight Bearing Restrictions: No ?Pain ?Pain Assessment ?Pain Scale: 0-10 ?Pain Score: 0-No pain ?Pain Interference ?Pain Interference ?Pain Effect on Sleep: 1. Rarely or not at all ?Pain Interference with Therapy Activities: 1. Rarely or not at all ?Pain Interference with Day-to-Day Activities: 1. Rarely or not at all ?Vision/Perception  ?Vision - History ?Ability to See in Adequate  Light: 0 Adequate ?Vision - Assessment ?Ocular Range of Motion: Restricted on the right ?Alignment/Gaze Preference: Within Defined Limits ?Tracking/Visual Pursuits: Other (comment);Able to track stimulus in all quads without difficulty (R eye does not track laterally (baseline)) ?Convergence: Other (comment) (basline limited convergence in right eye; left eye wnl) ?Perception ?Perception: Impaired ?Inattention/Neglect: Does not attend to right side of body (decreased attention to R side although improved since eval) ?Praxis ?Praxis: Intact  ?Cognition ?Overall Cognitive Status: Within Functional Limits for tasks assessed ?Arousal/Alertness: Awake/alert ?Orientation Level: Oriented X4 ?Year: 2023 ?Month: April ?Day of Week: Correct ?Attention: Focused;Sustained ?Focused Attention: Appears intact ?Sustained Attention: Appears intact ?Memory: Appears intact ?Safety/Judgment: Appears intact ?Sensation ?Sensation ?Light Touch: Appears Intact ?Hot/Cold: Not tested ?Proprioception: Impaired Detail ?Proprioception Impaired Details: Impaired RLE;Impaired RUE ?Stereognosis: Not tested ?Coordination ?Gross Motor Movements are Fluid and Coordinated: No ?Coordination and Movement Description: continues with R hemiparesis UE>LE ?Motor  ?Motor ?Motor: Hemiplegia;Abnormal postural alignment and control;Abnormal tone ?Motor - Discharge Observations: R hemiparesis  ?Mobility  ?Bed Mobility ?Bed Mobility: Supine to Sit;Sit to Supine ?Supine to Sit: Independent with assistive device ?Sit to Supine: Independent with assistive device ?Transfers ?Transfers: Sit to Stand;Stand to [employer] Transfers ?Sit to Stand: Supervision/Verbal cueing ?Stand to Sit: Supervision/Verbal cueing ?Stand Pivot Transfers: Supervision/Verbal cueing ?Stand Pivot Transfer Details: Verbal cues for safe use of DME/AE;Verbal cues for precautions/safety ?Transfer (Assistive device): Rolling walker ?Locomotion  ?Gait ?Ambulation: Yes ?Gait Assistance:  Supervision/Verbal cueing ?Gait Distance (Feet): 200 Feet ?Assistive device: Rolling walker;Other (Comment) (R hand orthosis) ?Gait Assistance Details: Verbal cues for precautions/safety;Verbal cues for safe use of DME/AE ?Gait ?Gait: Yes ?Gait Pattern: Impaired ?Gait Pattern: Poor foot clearance - right;Right flexed knee in stance;Step-through pattern;Decreased step length - left;Decreased step length - right;Decreased stance time - right;Decreased hip/knee flexion - right ?Gait  velocity: significantly decreased ?Stairs / Additional Locomotion ?Stairs: Yes ?Stairs Assistance: Contact Guard/Touching assist ?Stair Management Technique: Two rails;Alternating pattern;Step to pattern;Forwards (primarily performs reciprocal on ascent and step-to leading with L LE on descent (no R knee instability noted)) ?Number of Stairs: 12 ?Height of Stairs: 6 ?Ramp: Contact Guard/touching assist ?Curb: Contact Guard/Touching assist ?Wheelchair Mobility ?Wheelchair Mobility: No  ?Trunk/Postural Assessment  ?Cervical Assessment ?Cervical Assessment: Exceptions to [facility] (forward head) ?Thoracic Assessment ?Thoracic Assessment: Exceptions to [facility] (right shoulder depression and forward rounded posture) ?Lumbar Assessment ?Lumbar Assessment: Exceptions to [facility] (posterior pelvic tilt in sitting) ?Postural Control ?Postural Control: Within Functional Limits (using RW for basic mobility tasks)  ?Balance ?Balance ?Balance Assessed: Yes ?Standardized Balance Assessment ?Standardized Balance Assessment: Berg Balance Test ?Merrilee Jansky Balance Test ?Sit to Stand: Able to stand without using hands and stabilize independently ?Standing Unsupported: Able to stand safely 2 minutes ?Sitting with Back Unsupported but Feet Supported on Floor or Stool: Able to sit safely and securely 2 minutes ?Stand to Sit: Sits safely with minimal use of hands ?Transfers: Able to transfer safely, minor use of hands ?Standing Unsupported with Eyes Closed: Able to stand 10 seconds  safely ?Standing Ubsupported with Feet Together: Able to place feet together independently and stand 1 minute safely ?From Standing, Reach Forward with Outstretched Arm: Can reach confidently >25 cm (10") ?From Standing Position, Pick up Object from Floor: Able to pick up shoe, needs supervision ?From Standing Position, Turn to Look Behind Over each Shoulder: Looks behind from both sides and weight shifts well ?Turn 360 Degrees: Able to turn 360 degrees safely but slowly ?Standing Unsupported, Alternately Place Feet on Step/Stool: Able to complete 4 steps without aid or supervision ?Standing Unsupported, One Foot in Front: Able to plae foot ahead of the other independently and hold 30 seconds ?Standing on One Leg: Able to lift leg independently and hold equal to or more than 3 seconds ?Total Score: 48 ?Static Sitting Balance ?Static Sitting - Balance Support: Feet supported ?Static Sitting - Level of Assistance: 6: Modified independent (Device/Increase time) ?Dynamic Sitting Balance ?Dynamic Sitting - Balance Support: During functional activity;No upper extremity supported ?Dynamic Sitting - Level of Assistance: 6: Modified independent (Device/Increase time) ?Static Standing Balance ?Static Standing - Balance Support: During functional activity;Bilateral upper extremity supported ?Static Standing - Level of Assistance: 5: Stand by assistance ?Dynamic Standing Balance ?Dynamic Standing - Balance Support: During functional activity;Bilateral upper extremity supported ?Dynamic Standing - Level of Assistance: 5: Stand by assistance;Other (comment) (CGA) ?Extremity Assessment  ?  ?  ?RLE Assessment ?RLE Assessment: Exceptions to [facility] ?Active Range of Motion (AROM) Comments: WFL ?General Strength Comments: assessed in sitting ?RLE Strength ?Right Hip Flexion: 4/5 ?Right Knee Flexion: 4+/5 ?Right Knee Extension: 4+/5 ?Right Ankle Dorsiflexion: 4+/5 ?Right Ankle Plantar Flexion: 4+/5 ?LLE Assessment ?LLE Assessment: Within  Functional Limits ?General Strength Comments: Grossly 5/5 throughout in sitting ? ? ? ?Tawana Scale  , PT, DPT, NCS, CSRS ?Debbora Dus, PT, DPT, CBIS ? ?11/27/2021, 12:57 PM ?

## 2021-11-27 NOTE — Progress Notes (Signed)
Speech Language Pathology Daily Session Note ? ?Patient Details  ?Name: Paul [name] ?MRN: [id number] ?Date of Birth: 09-Aug-1986 ? ?Today's Date: 11/27/2021 ?SLP Individual Time: 1030-1100 ?SLP Individual Time Calculation (min): 30 min ? ?Short Term Goals: ?Week 2: SLP Short Term Goal 1 (Week 2): STG=LTG due to ELOS ? ?Skilled Therapeutic Interventions: ?Pt seen this date for skilled ST intervention targeting swallowing goals. Pt encountered awake/alert and sitting upright in bed. Communicated with clinician by typing messages on his phone without assistance. Agreeable to ST intervention. Please note, today's session was scheduled as family education; however, no family present for education. Pt reports he does not know where his family is at this time. ? ?Pt completed oral care via suction toothbrush with Set-Up A. Vocal quality wet at baseline; pt unable to produce volitional cough to clear. Pt participated in vocalization exercises to include sustained humming and humming with pitch glide to preferred songs. Good effort and fair accuracy noted. Humming intermittently elicited weak cough response. Therapeutic trials of puree were provided. Orally, pt demonstrates bolus holding, prolonged oral phase, piecemeal deglutition (multiple swallows per bolus), and oral residuals. Pharyngeally, pt exhibits increase in wet vocal quality and consistent cough with limited puree trials. SLP reinforced use of oral suctioning post-swallow, which pt demonstrated he could perform with Set-Up A. Between PO trials, pt requested to move to recliner chair, which pt did with CGA and help from NT. Following PO trials, oral care provided again to clear oral cavity of residuals. Communicated with pt the plan for completion of MBSS on 4/10; he verbalized understanding by giving a thumbs up.  ? ?Sessions concluded with pt in recliner chair, chair alarm on, call bell within reach, and all immediate needs met. Continue per current ST  POC. ? ?Pain ?Pain Assessment ?Pain Scale: 0-10 ?Pain Score: 0-No pain ? ?Therapy/Group: Individual Therapy ? ?Paul [name] A Paul [name] ?11/27/2021, 2:34 PM ?

## 2021-11-28 LAB — GLUCOSE, CAPILLARY
Glucose-Capillary: 102 mg/dL — ABNORMAL HIGH (ref 70–99)
Glucose-Capillary: 107 mg/dL — ABNORMAL HIGH (ref 70–99)
Glucose-Capillary: 120 mg/dL — ABNORMAL HIGH (ref 70–99)
Glucose-Capillary: 123 mg/dL — ABNORMAL HIGH (ref 70–99)
Glucose-Capillary: 144 mg/dL — ABNORMAL HIGH (ref 70–99)
Glucose-Capillary: 92 mg/dL (ref 70–99)

## 2021-11-28 MED ORDER — MAGNESIUM HYDROXIDE 400 MG/5ML PO SUSP: 30.0000 mL | Freq: Once | ORAL | Status: DC

## 2021-11-28 NOTE — Progress Notes (Signed)
Occupational Therapy Session Note ? ?Patient Details  ?Name: Paul [name] ?MRN: [id number] ?Date of Birth: [birth year] ? ?Today's Date: 11/28/2021 ?OT Individual Time: [id number] ?OT Individual Time Calculation (min): 65 min  ? ? ?Short Term Goals: ?Week 2:  OT Short Term Goal 1 (Week 2): pt will recall UB hemi strategies for 2 consecutive OT sessions ?OT Short Term Goal 2 (Week 2): pt will complete LB dressing with MOD A ?OT Short Term Goal 3 (Week 2): pt will complete 1/3 toileting tasks with CGA ?OT Short Term Goal 4 (Week 2): pt will complete bathing with MIN A from shower level ? ?Skilled Therapeutic Interventions/Progress Updates:  ?Pt greeted supine in bed  agreeable to OT intervention. Session focus on BADL reeducation, functional mobility, dynamic standing balance, RUE coordination and decreasing overall caregiver burden. ?      ?Pt completed supine>sit with supervision, CGA for ambulatory shower transfer with RW, pt enterd shower with CGA using grab bars. Pt completed bathing with supervision able to sit>stand with use of grab bars for posterior periarea bathing. Pt exited shower with CGA with RW, pt completed dressing from EOB with set- up for UB dressing, good carryover of hemi techniques. Pt also able to don pants with S via sit>stand. Pt ambulated to gym with rw and CGA- supervision.  ?Once in gym worked on dual task of completing functional ambulation aroudn gym with Rw and CGA with pt instructed to collect wash cloths placed around gym at various surface heights with RUE to faciliate improved dynamic standing balance and RUE coordination. Pt completed task with CGA. ?Pt also able to stand and fold wash cloths with close supervision with an emphasis on bilateral integration and RUE [place].  ?Pt ambulated back to room with Rw and CGA. Reviwed RUE [place] HEP as indicated below:  ? ?Access Code: [email or web address] ?URL: [email or web address] ?Date: 11/28/2021 ? ? ?Exercises ?- Thumb Opposition  - 1 x daily -  7 x weekly - 3 sets - 10 reps ?- Finger Spreading  - 1 x daily - 7 x weekly - 3 sets - 10 reps ?- Finger MP Flexion AROM  - 1 x daily - 7 x weekly - 3 sets - 10 reps ?- Seated Finger Composite Flexion Extension  - 1 x daily - 7 x weekly - 3 sets - 10 reps ?- Seated Single Finger Extension  - 1 x daily - 7 x weekly - 3 sets - 10 reps ?- Seated Pen Hurdles  - 1 x daily - 7 x weekly - 3 sets - 10 reps ?- Wrist AROM Radial Ulnar Deviation  - 1 x daily - 7 x weekly - 3 sets - 10 reps ?- Wrist AROM Flexion Extension  - 1 x daily - 7 x weekly - 3 sets - 10 reps ?- Seated Elbow Flexion and Extension AROM  - 1 x daily - 7 x weekly - 3 sets - 10 reps    ?Pt left seated in recliner with alarm belt activated and all needs within reach.                ? ?Of note no drainage noted from PEG tube site, PEG was covered during bathing.  ?Therapy Documentation ?Precautions:  ?Precautions ?Precautions: Fall, Other (comment) ?Precaution Comments: right hemi, mild right inattention, PEG tube ?Restrictions ?Weight Bearing Restrictions: Yes ?Pain: no pain reported during session  ? ? ?Therapy/Group: Individual Therapy ? ?[name] ?11/28/2021, 10:44 AM ?

## 2021-11-28 NOTE — Progress Notes (Signed)
Occupational Therapy Discharge Summary ? ?Patient Details  ?Name: Paul [name] ?MRN: [id number] ?Date of Birth: 1986-02-22 ? ?Patient has met 6 of 8 long term goals due to improved activity tolerance, improved balance, postural control, ability to compensate for deficits, and improved awareness. Pt has made good progress during LOS d/t improvements in balance, motor planning, strength, endurance and proprioception.  Pt continues to present with impaired RUE FMC, R inattention, and decreased ability to clear RLE during gait putting pt at risk for increased falls at home. Patient to discharge at [facility] Assist level.  Patient's care partner is independent to provide the necessary physical and cognitive assistance at discharge.  Pts sister was present for family training however she works night shift, reiterated to sister the recommendation of pt needing CGA for functional ambulation with Rw and CGA- supervision for all ADLs. Sister verbalized understanding but did not complete hands on training.  ? ?Reasons goals not met: pt needs CGA for 3/3 toileting tasks d/t balance impairments and decreased [place] in RUE duirng pericare and clothing mgmt. Pt needs CGA for ambulatory toilet transfer with RW d/t impaired balance and intermittent issues with clearing RLE during gait.  ? ?Recommendation:  ?Patient will benefit from ongoing skilled OT services in outpatient setting to continue to advance functional skills in the area of BADL and Reduce care partner burden. He may not receive this service due to payment but this is recommended.  ? ?Equipment: ?No DME needs ? ?Reasons for discharge: treatment goals met and discharge from hospital ? ?Patient/family agrees with progress made and goals achieved: Yes ? ?OT Discharge ?Precautions/Restrictions  ?Precautions ?Precautions: Fall;Other (comment) ?Precaution Comments: right hemi, mild right inattention, PEG tube ?Restrictions ?Weight Bearing Restrictions: Yes ?General ?  ?Vital  Signs ?Therapy Vitals ?Pulse Rate: (!) 52 ?Pain ?  ?ADL ?ADL ?Equipment Provided: Long-handled shoe horn, Other (comment) (bath mit) ?Eating: Set up ?Grooming: Setup ?Where Assessed-Grooming: Sitting at sink ?Upper Body Bathing: Supervision/safety ?Where Assessed-Upper Body Bathing: Shower ?Lower Body Bathing: Supervision/safety ?Where Assessed-Lower Body Bathing: Shower ?Upper Body Dressing: Setup ?Where Assessed-Upper Body Dressing: Edge of bed ?Lower Body Dressing: Supervision/safety ?Where Assessed-Lower Body Dressing: Edge of bed ?Toileting: Supervision/safety ?Where Assessed-Toileting: Toilet ?Toilet Transfer: Close supervision ?Toilet Transfer Method: Ambulating (RW) ?Tub/Shower Transfer: Contact guard ?Tub/Shower Transfer Method: Ambulating ?Tub/Shower Equipment: Radio broadcast assistant ?Walk-In Shower Transfer: Contact guard ?Walk-In Shower Transfer Method: Ambulating ?Walk-In Shower Equipment: Radio broadcast assistant ?ADL Comments: CGA- supervision overall for ADLS for safety and balance ?Vision ?Baseline Vision/History: 0 No visual deficits (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) ?Patient Visual Report: No change from baseline ?Vision Assessment?: Yes ?Eye Alignment: Within Functional Limits ?Ocular Range of Motion: Restricted on the right ?Alignment/Gaze Preference: Within Defined Limits ?Tracking/Visual Pursuits: Other (comment);Able to track stimulus in all quads without difficulty (R eye does not track laterally; baseline LOF) ?Saccades: Additional eye shifts occurred during testing;Within functional limits ?Convergence: Other (comment) (basline limited convergence in right eye; left eye wnl) ?Visual Fields: No apparent deficits ?Perception  ?Perception: Impaired ?Inattention/Neglect: Does not attend to right side of body (decreased R attention, but improved from eval) ?Praxis ?Praxis: Intact ?Cognition ?Cognition ?Overall Cognitive Status: Within Functional Limits for tasks assessed ?Arousal/Alertness:  Awake/alert ?Orientation Level: Person;Place;Situation ?Memory: Appears intact ?Attention: Focused;Sustained ?Focused Attention: Appears intact ?Sustained Attention: Appears intact ?Selective Attention: Appears intact ?Awareness: Appears intact ?Problem Solving: Appears intact ?Executive Function: Sequencing ?Sequencing: Appears intact ?Safety/Judgment: Appears intact ?Brief Interview for Mental Status (BIMS) ?Repetition of Three Words (First Attempt): 3 (all  answers texted on phone) ?Temporal Orientation: Year: Correct ?Temporal Orientation: Month: Accurate within 5 days ?Temporal Orientation: Day: Correct ?Recall: "Sock": Yes, no cue required ?Recall: "Blue": Yes, no cue required ?Recall: "Bed": Yes, no cue required ?BIMS Summary Score: 15 ?Sensation ?Sensation ?Light Touch: Appears Intact ?Hot/Cold: Appears Intact ?Proprioception: Impaired Detail ?Proprioception Impaired Details: Impaired RLE;Impaired RUE ?Stereognosis: Not tested ?Coordination ?Gross Motor Movements are Fluid and Coordinated: No ?Fine Motor Movements are Fluid and Coordinated: No ?Coordination and Movement Description: continues with R hemiparesis UE>LE ?9 Hole Peg Test: unable to complete ?Motor  ?Motor ?Motor: Hemiplegia;Abnormal postural alignment and control;Abnormal tone ?Motor - Discharge Observations: R hemiparesis ?Mobility  ?Bed Mobility ?Bed Mobility: Supine to Sit;Sit to Supine ?Supine to Sit: Independent with assistive device ?Sit to Supine: Independent with assistive device ?Transfers ?Sit to Stand: Supervision/Verbal cueing ?Stand to Sit: Supervision/Verbal cueing  ?Trunk/Postural Assessment  ?Cervical Assessment ?Cervical Assessment: Exceptions to [facility] (forward head) ?Thoracic Assessment ?Thoracic Assessment: Exceptions to [facility] (right shoulder depression and forward rounded posture) ?Lumbar Assessment ?Lumbar Assessment: Exceptions to [facility] (posterior pelvic tilt in sitting) ?Postural Control ?Postural Control: Within Functional  Limits (using RW for basic mobility tasks)  ?Balance ?Balance ?Balance Assessed: Yes ?Static Sitting Balance ?Static Sitting - Balance Support: Feet supported ?Static Sitting - Level of Assistance: 6: Modified independent (Device/Increase time) ?Dynamic Sitting Balance ?Dynamic Sitting - Balance Support: During functional activity;No upper extremity supported ?Dynamic Sitting - Level of Assistance: 6: Modified independent (Device/Increase time) ?Static Standing Balance ?Static Standing - Balance Support: During functional activity;Bilateral upper extremity supported ?Static Standing - Level of Assistance: 5: Stand by assistance ?Dynamic Standing Balance ?Dynamic Standing - Balance Support: During functional activity;Bilateral upper extremity supported ?Dynamic Standing - Level of Assistance: 5: Stand by assistance;Other (comment) ?Dynamic Standing - Comments: CGA for dynamic balance during ADLs ?Extremity/Trunk Assessment ?RUE Assessment ?RUE Assessment: Exceptions to [facility] ?Active Range of Motion (AROM) Comments: WFL ?General Strength Comments: 3+/5 mmt ?RUE Body System: Neuro ?Brunstrum levels for arm and hand: Arm;Hand ?Brunstrum level for arm: Stage V Relative Independence from Synergy ?Brunstrum level for hand: Stage V Independence from basic synergies ?LUE Assessment ?LUE Assessment: Within Functional Limits ? ? ?[name] ?11/28/2021, 7:56 AM ?

## 2021-11-28 NOTE — Progress Notes (Signed)
Pt refused milk of mag. Education reinforced ?

## 2021-11-29 ENCOUNTER — Inpatient Hospital Stay (HOSPITAL_COMMUNITY): Payer: Medicaid Other

## 2021-11-29 ENCOUNTER — Other Ambulatory Visit (HOSPITAL_COMMUNITY): Payer: Self-pay

## 2021-11-29 LAB — BASIC METABOLIC PANEL
Anion gap: 8 (ref 5–15)
BUN: 30 mg/dL — ABNORMAL HIGH (ref 6–20)
CO2: 29 mmol/L (ref 22–32)
Calcium: 9.3 mg/dL (ref 8.9–10.3)
Chloride: 99 mmol/L (ref 98–111)
Creatinine, Ser: 1.71 mg/dL — ABNORMAL HIGH (ref 0.61–1.24)
GFR, Estimated: 53 mL/min — ABNORMAL LOW (ref >60)
Glucose, Bld: 107 mg/dL — ABNORMAL HIGH (ref 70–99)
Potassium: 4.9 mmol/L (ref 3.5–5.1)
Sodium: 136 mmol/L (ref 135–145)

## 2021-11-29 LAB — CBC
HCT: 51.2 % (ref 39.0–52.0)
Hemoglobin: 16.9 g/dL (ref 13.0–17.0)
MCH: 27.3 pg (ref 26.0–34.0)
MCHC: 33 g/dL (ref 30.0–36.0)
MCV: 82.6 fL (ref 80.0–100.0)
Platelets: 273 10*3/uL (ref 150–400)
RBC: 6.2 MIL/uL — ABNORMAL HIGH (ref 4.22–5.81)
RDW: 13 % (ref 11.5–15.5)
WBC: 6.5 10*3/uL (ref 4.0–10.5)
nRBC: 0 % (ref 0.0–0.2)

## 2021-11-29 LAB — GLUCOSE, CAPILLARY
Glucose-Capillary: 106 mg/dL — ABNORMAL HIGH (ref 70–99)
Glucose-Capillary: 108 mg/dL — ABNORMAL HIGH (ref 70–99)
Glucose-Capillary: 112 mg/dL — ABNORMAL HIGH (ref 70–99)
Glucose-Capillary: 112 mg/dL — ABNORMAL HIGH (ref 70–99)
Glucose-Capillary: 126 mg/dL — ABNORMAL HIGH (ref 70–99)
Glucose-Capillary: 130 mg/dL — ABNORMAL HIGH (ref 70–99)

## 2021-11-29 MED ORDER — CLONIDINE HCL 0.2 MG PO TABS
0.2000 mg | ORAL_TABLET | Freq: Three times a day (TID) | ORAL | 0 refills | Status: DC
  Filled 2021-11-29: qty 90, 30d supply, fill #0

## 2021-11-29 MED ORDER — ACETAMINOPHEN 325 MG PO TABS
325.0000 mg | ORAL_TABLET | ORAL | 0 refills | Status: DC | PRN
  Filled 2021-11-29: qty 30, 3d supply, fill #0

## 2021-11-29 MED ORDER — ROSUVASTATIN CALCIUM 20 MG PO TABS
20.0000 mg | ORAL_TABLET | Freq: Every day | ORAL | 0 refills | Status: DC
  Filled 2021-11-29: qty 30, 30d supply, fill #0

## 2021-11-29 MED ORDER — AMLODIPINE BESYLATE 10 MG PO TABS
10.0000 mg | ORAL_TABLET | Freq: Every day | ORAL | 0 refills | Status: DC
  Filled 2021-11-29: qty 30, 30d supply, fill #0

## 2021-11-29 MED ORDER — FLUTICASONE PROPIONATE 50 MCG/ACT NA SUSP
2.0000 | Freq: Two times a day (BID) | NASAL | 2 refills | Status: DC
  Filled 2021-11-29: qty 16, 30d supply, fill #0
  Filled 2022-02-28: qty 16, 30d supply, fill #1

## 2021-11-29 MED ORDER — SPIRONOLACTONE 50 MG PO TABS
50.0000 mg | ORAL_TABLET | Freq: Every day | ORAL | 0 refills | Status: DC
  Filled 2021-11-29: qty 30, 30d supply, fill #0

## 2021-11-29 MED ORDER — POLYETHYLENE GLYCOL 3350 17 GM/SCOOP PO POWD
17.0000 g | Freq: Every day | ORAL | 0 refills | Status: DC
  Filled 2021-11-29: qty 476, 28d supply, fill #0

## 2021-11-29 MED ORDER — HYDRALAZINE HCL 25 MG PO TABS
75.0000 mg | ORAL_TABLET | Freq: Three times a day (TID) | ORAL | 0 refills | Status: DC
  Filled 2021-11-29: qty 180, 20d supply, fill #0

## 2021-11-29 MED ORDER — PANTOPRAZOLE SODIUM 40 MG PO PACK
40.0000 mg | PACK | Freq: Every day | ORAL | 0 refills | Status: DC
  Filled 2021-11-29: qty 30, 30d supply, fill #0

## 2021-11-29 MED ORDER — CARVEDILOL 25 MG PO TABS: 25.0000 mg | ORAL_TABLET | Freq: Two times a day (BID) | ORAL | 0 refills | Status: DC

## 2021-11-29 MED ORDER — LORATADINE 10 MG PO TABS
10.0000 mg | ORAL_TABLET | Freq: Every day | ORAL | 0 refills | Status: DC
Start: 2021-11-30 — End: 2022-03-21
  Filled 2021-11-29: qty 30, 30d supply, fill #0

## 2021-11-29 MED ORDER — SENNOSIDES 8.8 MG/5ML PO SYRP
10.0000 mL | ORAL_SOLUTION | Freq: Every day | ORAL | 0 refills | Status: DC
  Filled 2021-11-29: qty 237, 23d supply, fill #0
  Filled 2022-02-28: qty 237, 23d supply, fill #1

## 2021-11-29 MED ORDER — ORAL CARE MOUTH RINSE
15.0000 mL | Freq: Two times a day (BID) | OROMUCOSAL | 0 refills | Status: DC
Start: 2021-11-29 — End: 2022-03-21
  Filled 2021-11-29 – 2022-02-28 (×2): qty 710, 24d supply, fill #0

## 2021-11-29 MED ORDER — OSMOLITE 1.5 CAL PO LIQD
1000.0000 mL | ORAL | 0 refills | Status: DC
Start: 2021-11-29 — End: 2021-11-30

## 2021-11-29 MED ORDER — MODAFINIL 100 MG PO TABS
100.0000 mg | ORAL_TABLET | Freq: Every day | ORAL | 0 refills | Status: DC
Start: 2021-11-30 — End: 2021-12-24
  Filled 2021-11-29: qty 30, 30d supply, fill #0

## 2021-11-29 MED ORDER — MODAFINIL 100 MG PO TABS
100.0000 mg | ORAL_TABLET | Freq: Every day | ORAL | Status: DC
  Administered 2021-11-30: 100 mg
  Filled 2021-11-29: qty 1

## 2021-11-29 MED ORDER — FREE WATER: 200.0000 mL | Freq: Three times a day (TID) | Status: DC

## 2021-11-29 MED ORDER — HYDROCHLOROTHIAZIDE 25 MG PO TABS
25.0000 mg | ORAL_TABLET | Freq: Every day | ORAL | 0 refills | Status: DC
  Filled 2021-11-29: qty 30, 30d supply, fill #0

## 2021-11-29 NOTE — Plan of Care (Signed)
?  Problem: RH Balance ?Goal: LTG Patient will maintain dynamic standing balance (PT) ?Description: LTG:  Patient will maintain dynamic standing balance with assistance during mobility activities (PT) ?Outcome: Not Met (add Reason) ?  ?Problem: Sit to Stand ?Goal: LTG:  Patient will perform sit to stand with assistance level (PT) ?Description: LTG:  Patient will perform sit to stand with assistance level (PT) ?Outcome: Not Met (add Reason) ?  ?Problem: RH Bed to Chair Transfers ?Goal: LTG Patient will perform bed/chair transfers w/assist (PT) ?Description: LTG: Patient will perform bed to chair transfers with assistance (PT). ?Outcome: Not Met (add Reason) ?  ?Problem: RH Car Transfers ?Goal: LTG Patient will perform car transfers with assist (PT) ?Description: LTG: Patient will perform car transfers with assistance (PT). ?Outcome: Not Met (add Reason) ?  ?Problem: RH Ambulation ?Goal: LTG Patient will ambulate in controlled environment (PT) ?Description: LTG: Patient will ambulate in a controlled environment, # of feet with assistance (PT). ?Outcome: Not Met (add Reason) ?Goal: LTG Patient will ambulate in home environment (PT) ?Description: LTG: Patient will ambulate in home environment, # of feet with assistance (PT). ?Outcome: Not Met (add Reason) ?  ?Problem: RH Balance ?Goal: LTG Patient will maintain dynamic sitting balance (PT) ?Description: LTG:  Patient will maintain dynamic sitting balance with assistance during mobility activities (PT) ?Outcome: Completed/Met ?  ?Problem: RH Bed Mobility ?Goal: LTG Patient will perform bed mobility with assist (PT) ?Description: LTG: Patient will perform bed mobility with assistance, with/without cues (PT). ?Outcome: Completed/Met ?  ?Problem: RH Stairs ?Goal: LTG Patient will ambulate up and down stairs w/assist (PT) ?Description: LTG: Patient will ambulate up and down # of stairs with assistance (PT) ?Outcome: Completed/Met ?  ?

## 2021-11-29 NOTE — Progress Notes (Signed)
Inpatient Rehabilitation Discharge Medication Review by a Pharmacist ? ?A complete drug regimen review was completed for this patient to identify any potential clinically significant medication issues. ? ?High Risk Drug Classes Is patient taking? Indication by Medication  ?Antipsychotic No   ?Anticoagulant No   ?Antibiotic No   ?Opioid No   ?Antiplatelet No   ?Hypoglycemics/insulin No   ?Vasoactive Medication Yes Norvasc, coreg, catapres, hydralazine, HCTZ, spironolactone- hypertension  ?Chemotherapy No   ?Other Yes Protonix- GERD ?Crestor- HLD ?Flonase, claritin- allergies ?Modafinil- daytime fatigue  ? ? ? ?Type of Medication Issue Identified Description of Issue Recommendation(s)  ?Drug Interaction(s) (clinically significant) ?    ?Duplicate Therapy ?    ?Allergy ?    ?No Medication Administration End Date ?    ?Incorrect Dose ?    ?Additional Drug Therapy Needed ?    ?Significant med changes from prior encounter (inform family/care partners about these prior to discharge).    ?Other ?    ? ? ?Clinically significant medication issues were identified that warrant physician communication and completion of prescribed/recommended actions by midnight of the next day:  No ? ?Time spent performing this drug regimen review (minutes):  30 ? ? ?Rhydian Baldi BS, PharmD, BCPS ?Clinical Pharmacist ?11/30/2021 9:50 AM ? ?Contact: [phone] after 3 PM ? ?"Be curious, not judgmental..." -Jamal Maes ?

## 2021-11-29 NOTE — Progress Notes (Signed)
Occupational Therapy Session Note ? ?Patient Details  ?Name: Paul [name] ?MRN: [id number] ?Date of Birth: 01-20-1986 ? ?Today's Date: 11/29/2021 ?OT Individual Time: [id number] ?OT Individual Time Calculation (min): 60 min  ? ? ?Short Term Goals: ?Week 2:  OT Short Term Goal 1 (Week 2): pt will recall UB hemi strategies for 2 consecutive OT sessions ?OT Short Term Goal 2 (Week 2): pt will complete LB dressing with MOD A ?OT Short Term Goal 3 (Week 2): pt will complete 1/3 toileting tasks with CGA ?OT Short Term Goal 4 (Week 2): pt will complete bathing with MIN A from shower level ? ?Skilled Therapeutic Interventions/Progress Updates:  ?Pt greeted seated in recliner with sister present for family education( mother was also supposed to attend). pt agreeable to OT intervention. Session focus on BADL reeducation,family education, functional mobility, dynamic standing balance, RUE therex and decreasing overall caregiver burden.               ?General education provided to sister on pt needing supervision - CGA for all ADLS with RW needed for all functional mobility tasks. Pt completed functional ambulation from room to tub room with Rw and CGA. Pt completed ambulatory transfer to TTB with Rw and CGA. Pts sister reports they have the same tub seat at home. Reiterated to sister the importance of having someone close to pt during all ADLs but especially bathing for safety/balance and greatly d/t pts language deficits, sister nodded in understanding. Pt ambulated back to room with rw and CGA. Reviewed all of pts HEPs including, 2 [place] HEPs and theraput[place] HEP. Pt nodded in agreement to understanding all HEP therex.  ?Pt completed functional ambulation to bathroom for toileting with Rw and cGA, pt completed 3/3 toileting tasks with supervision, pt needed new pants d/t +BM. Pt donned pants with supervision. Pt ambulated back to recliner with RW and CGA. Pt left seated in recliner handed off to PT.             ?Therapy  Documentation ?Precautions:  ?Precautions ?Precautions: Fall, Other (comment) ?Precaution Comments: right hemi, mild right inattention, PEG tube ?Restrictions ?Weight Bearing Restrictions: No ? ?Pain: no pain reported during session  ? ? ? ?Therapy/Group: Individual Therapy ? ?[name] ?11/29/2021, 3:47 PM ?

## 2021-11-29 NOTE — Progress Notes (Signed)
Family education provided on medication ?Administration, flushing, peg-tube site care/dressing change, and bolus feeding. ?Patient was able to provide return demonstration on medication administration and flushing. ? ? ? ?Tilden Dome, LPN ?

## 2021-11-29 NOTE — Progress Notes (Signed)
Patient ID: Paul [name], male   DOB: 1986/04/20, 36 y.o.   MRN: [id number] ? ?Patient Paul [name] entered ?[id number] ?

## 2021-11-29 NOTE — Progress Notes (Signed)
Patient ID: Paul [name], male   DOB: Jan 22, 1986, 36 y.o.   MRN: [id number] ? ?Rolling Walker ordered through [employer].  ?

## 2021-11-29 NOTE — Progress Notes (Signed)
Speech Language Pathology Discharge Summary ? ?Patient Details  ?Name: Paul [name] ?MRN: [id number] ?Date of Birth: 1986/06/03 ? ?Today's Date: 11/29/2021 ?SLP Individual Time: [id number] ?SLP Individual Time Calculation (min): 40 min ? ? ?Skilled Therapeutic Interventions:  Skilled treatment session focused on dysphagia goals and completion of family education with the patient and his sister. SLP facilitated session by providing skilled observation with thin liquids via straw. Patient consumed liquids without overt s/s of aspiration but required more than a reasonable amount of time for AP transit with use of multiple swallows. Recommend patient continue current diet. SLP also facilitated session by providing education with the patient and his sister regarding current swallowing deficits, diet recommendations, medication administration and overt s/s of aspiration. All questions were answered at this time. Patient left upright in recliner with all needs within reach.  ? ?Patient has met 4 of 4 long term goals.  Patient to discharge at overall Modified Independent level.  ? ?Reasons goals not met: N/A  ? ?Clinical Impression/Discharge Summary: Patient has made functional gains and has met 4 of 4 LTGs this admission. Patient had an MBS today which showed a severe oral dysphagia and mild pharyngeal dysphagia with moderate pyriform sinus residue with thin liquids. However, no penetration or aspiration observed. Therefore, patient placed on a thin liquid diet via straw with medications via PEG. Patient is currently consuming his current diet with minimal overt s/s of aspiration. Patient remains essentially nonverbal due to severe oral-motor weakness but demonstrates improved lingual and labial ROM with increased ability to voice and produce individual phonemes on command. Patient independently utilizes gestures and texting for functional communication. Patient and family education is complete and patient will discharge  home with supervision from family. Patient would benefit from f/u SLP services to maximize his swallowing  function and verbal expression. Also suspect patient will be ready for repeat MBS in ~6-8 weeks.   ? ?Care Partner:  ?Caregiver Able to Provide Assistance: Yes  ?Type of Caregiver Assistance: Physical;Cognitive ? ?Recommendation:  ?Outpatient SLP  ?Rationale for SLP Follow Up: Reduce caregiver burden;Maximize functional communication;Maximize swallowing safety  ? ?Equipment: Suction  ? ?Reasons for discharge: Discharged from hospital;Treatment goals met  ? ?Patient/Family Agrees with Progress Made and Goals Achieved: Yes  ? ? ?[name] ?11/29/2021, 3:09 PM ? ?

## 2021-11-29 NOTE — Progress Notes (Signed)
Modified Barium Swallow Progress Note ? ?Patient Details  ?Name: Paul [name] ?MRN: [id number] ?Date of Birth: March 29, 1986 ? ?Today's Date: 11/29/2021 ? ?Modified Barium Swallow completed.  Full report located under Chart Review in the Imaging Section. ? ?Brief recommendations include the following: ? ?Clinical Impression ? Patient demonstrates a severe oral dysphagia and mild pharyngeal dysphagia. Oral phase is characterized by minimal movement of oral-motor musculature resulting in anterior spillage, weak lingual manipulation, and prolonged AP transit of thin liquids. Patient utilized a posterior head tilt to assist in AP transit along with piecemeal swallowing.  Pharyngeal phase is charactered by decreased base of tongue retraction and reduced anterior laryngeal movement resulting in moderate pyriform sinus residue. Residue was not reduced with posterior changes. No penetration or aspiration observed. Due to pyriform sinus residue, thicker consistencies were not tested today.  Recommend patient initiate a thin liquid diet via straw with medications via PEG. ?  ?Swallow Evaluation Recommendations ? ?   ? ? SLP Diet Recommendations: Thin liquid (clear liquids) ? ? Liquid Administration via: Straw ? ? Medication Administration: Via alternative means ? ? Supervision: Patient able to self feed ? ? Compensations: Minimize environmental distractions;Small sips/bites;Slow rate;Multiple dry swallows after each bite/sip;Monitor for anterior loss ? ? Oral Care Recommendations: Oral care BID ? ? Other Recommendations: Have oral suction available ? ? ? ?Paul [name] ?11/29/2021,12:25 PM ?
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
## 2021-11-29 NOTE — Progress Notes (Signed)
Physical Therapy Session Note ? ?Patient Details  ?Name: Paul [name] ?MRN: [id number] ?Date of Birth: 06-12-1986 ? ?Today's Date: 11/29/2021 ?PT Individual Time: [id number] ?PT Individual Time Calculation (min): 40 min  ? ?Short Term Goals: ?Week 2:  PT Short Term Goal 1 (Week 2): = to LTGs based on ELOS ?Week 3:    ? ?Skilled Therapeutic Interventions/Progress Updates:  ?  Patient received sitting up in recliner, sister present for family ed, hand off from [employer]. Agreeable to PT. He denies pain. Sister requesting to simply observe and not participate in hands on family education. PT educating patient and sister on importance of using gait belt and RW for all mobility and the recommendation to have someone provide close supervision (within arms length) or CGA. Patient ambulating to therapy gym with RW and CGA with verbal cues for increased R foot clearance. Able to negotiate 4 steps with B HR and CGA. Patient transferring into truck-height car with CGA and RW. He ambulated back to his room. PT educating patient and sister on how to guard patient when ambulating and when negotiating steps. Sister asking if patient could drive. PT informing sister that patient cannot drive at this time and will need to be cleared by MD to do so. Patient ambulating back to his room with RW and CGA, returning to bed per patient request. PT providing patient with HEP. Bed alarm on, call light within reach.  ? ?Therapy Documentation ?Precautions:  ?Precautions ?Precautions: Fall, Other (comment) ?Precaution Comments: right hemi, mild right inattention, PEG tube ?Restrictions ?Weight Bearing Restrictions: No ? ? ? ?Therapy/Group: Individual Therapy ? ?Debbora Dus ?Debbora Dus, PT, DPT, CBIS ? ?11/29/2021, 7:50 AM  ?

## 2021-11-30 ENCOUNTER — Other Ambulatory Visit (HOSPITAL_COMMUNITY): Payer: Self-pay

## 2021-11-30 DIAGNOSIS — R739 Hyperglycemia, unspecified: Secondary | ICD-10-CM

## 2021-11-30 DIAGNOSIS — M7741 Metatarsalgia, right foot: Secondary | ICD-10-CM

## 2021-11-30 DIAGNOSIS — Z931 Gastrostomy status: Secondary | ICD-10-CM

## 2021-11-30 LAB — GLUCOSE, CAPILLARY: Glucose-Capillary: 112 mg/dL — ABNORMAL HIGH (ref 70–99)

## 2021-11-30 MED ORDER — CARVEDILOL 25 MG PO TABS
25.0000 mg | ORAL_TABLET | Freq: Two times a day (BID) | ORAL | 0 refills | Status: DC
  Filled 2021-11-30: qty 60, 30d supply, fill #0

## 2021-11-30 MED ORDER — OSMOLITE 1.5 CAL PO LIQD: 356.0000 mL | Freq: Four times a day (QID) | ORAL | 0 refills | Status: DC

## 2021-11-30 NOTE — Discharge Summary (Signed)
Physician Discharge Summary  ?Patient ID: ?Paul [name] ?MRN: [id number] ?DOB/AGE: 1986/08/22 36 y.o. ? ?Admit date: 11/13/2021 ?Discharge date: 11/30/2021 ? ?Discharge Diagnoses:  ?Principal Problem: ?  Nontraumatic acute hemorrhage of basal ganglia (HCC) ?Active Problems: ?  Elevated LDL cholesterol level ?  Acute on chronic renal failure (HCC) ?  Dysphagia due to recent stroke ?  Hyperglycemia due to tube feeds ?  S/P percutaneous endoscopic gastrostomy (PEG) tube placement ([place]) ?  Metatarsalgia of right foot ? ? ?Discharged Condition: stable ? ?Significant Diagnostic Studies: ?CT ABDOMEN WO CONTRAST ? ?Result Date: 11/18/2021 ?CLINICAL DATA:  anatomy eval for G tube placement EXAM: CT ABDOMEN WITHOUT CONTRAST TECHNIQUE: Multidetector CT imaging of the abdomen was performed following the standard protocol without IV contrast. RADIATION DOSE REDUCTION: This exam was performed according to the departmental dose-optimization program which includes automated exposure control, adjustment of the mA and/or kV according to patient size and/or use of iterative reconstruction technique. COMPARISON:  Chest XR, 11/11/2021.  KUB, 11/08/2021. FINDINGS: Lower chest: Trace bibasilar atelectasis. Hepatobiliary: Normal noncontrast appearance of liver. No gallstones, gallbladder wall thickening, or biliary dilatation. Pancreas: No pancreatic ductal dilatation or surrounding inflammatory changes. Spleen: Normal in size without focal abnormality. Adrenals/Urinary Tract: Adrenal glands are unremarkable. Kidneys are normal, without renal calculi, focal lesion, or hydronephrosis. Stomach/Bowel: Stomach is within normal limits. Transpyloric enteric feeding tube, with tip within the duodenum. Visualized portions of appendix appear normal. No evidence of bowel wall thickening, distention, or inflammatory changes. Vascular/Lymphatic: No enlarged abdominal or pelvic lymph nodes. Other: A ventriculoperitoneal shunt is incompletely imaged, with  the distal portion seen coursing along the anterior RIGHT chest, RIGHT abdomen, and tip descending into the pelvis via the RIGHT pericolic gutter. Anterior abdominal wall contusions, likely injection sites. Musculoskeletal: No acute osseous findings. IMPRESSION: 1. No acute abdominal process. 2. Anatomy is amenable to percutaneous gastrostomy placement. Michaelle Birks, MD [facility] Electronically Signed   By: Michaelle Birks M.D.   On: 11/18/2021 14:54  ? ?DG Skull 1-3 Views ? ?Result Date: 11/16/2021 ?CLINICAL DATA:  Ventricular shunt placement EXAM: SKULL - 1-3 VIEW COMPARISON:  No prior skull radiograph, correlation is made with 11/11/2021 CTA head neck FINDINGS: Prior right cranioplasties. Left frontal approach ventriculostomy catheter. Additional shunt catheter enters through a left inferior parietal burr hole. Shunt catheter tubing is seen more inferiorly, in the left neck, but no radiopaque tubing appears to connect the intracranial shunt catheters with the neck tubing. A nasogastric tube is noted. Hypoplastic right frontal sinus. The remaining sinuses are patent. IMPRESSION: Right frontal and left inferior parietal approach ventriculostomy catheters, without radiopaque tubing connecting these to the shunt tubing in the left neck. Correlate with expected catheter tubing locations. Electronically Signed   By: Merilyn Baba M.D.   On: 11/16/2021 16:58  ? ? ? ? ?MR BRAIN WO CONTRAST ? ?Result Date: 11/17/2021 ?CLINICAL DATA:  Stroke follow-up. EXAM: MRI HEAD WITHOUT CONTRAST TECHNIQUE: Multiplanar, multiecho pulse sequences of the brain and surrounding structures were obtained without intravenous contrast. COMPARISON:  Head and neck CTA 11/11/2021 FINDINGS: Brain: A 2.2 x 1.9 x 2.5 cm hemorrhage centered in the left putamen with mild surrounding edema is unchanged. A left frontal approach ventriculostomy catheter is again noted terminating in the frontal horn  of the right lateral ventricle, and there is gliosis along the catheter in the left frontal lobe. There is prominent susceptibility artifact from the external portion of the shunt which obscures portions of the left  frontal lobe, particularly on diffusion weighted and susceptibility weighted imaging limiting assessment for acute infarct. Within this limitation, no sizable acute infarct is identified separate from the left basal ganglia hemorrhage. An additional catheter is again noted coursing over the lateral left cerebral convexity in terminating in the left middle cranial fossa. There are numerous chronic microhemorrhages in the cerebrum (particularly right frontotemporal region) and cerebellum. The ventricles are normal in size. Chronic infarcts are noted in the inferior right cerebellar hemisphere and left medulla. Chronic lacunar infarcts versus dilated perivascular spaces are noted in the basal ganglia bilaterally. There is no extra-axial fluid collection. Vascular: Major intracranial vascular flow voids are preserved. Skull and upper cervical spine: Bilateral craniotomies. Sinuses/Orbits: Unremarkable orbits. Mild mucosal thickening in the paranasal sinuses. Small left mastoid effusion. Other: None. IMPRESSION: 1. Unchanged 2.5 cm left basal ganglia hemorrhage with mild surrounding edema. 2. Chronic microhemorrhages in the cerebrum and cerebellum which may be related to the patient's history of hypertension and traumatic brain injury. 3. 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 Electronically Signed   By: Logan Bores M.D.   On: 11/17/2021 08:59  ? ?IR GASTROSTOMY TUBE MOD SED ? ?Result Date: 11/19/2021 ?CLINICAL DATA:  Hemorrhagic stroke, dysphagia and need for percutaneous gastrostomy tube for chronic nutrition. EXAM: PERCUTANEOUS GASTROSTOMY TUBE PLACEMENT ANESTHESIA/SEDATION: Moderate (conscious) sedation was employed during this procedure. A total of Versed 2.0 mg and Fentanyl 75 mcg was  administered intravenously. Moderate Sedation Time: 12 minutes. The patient's level of consciousness and vital signs were monitored continuously by radiology nursing throughout the procedure under my direct supervision. CONTRAST:  20 mL Omnipaque 300 MEDICATIONS: 2 g IV Ancef. IV antibiotic was administered in an appropriate time interval prior to needle puncture of the skin. FLUOROSCOPY TIME:  54 seconds.  6.0 mGy. PROCEDURE: The procedure, risks, benefits, and alternatives were explained to the patient's mother. Questions regarding the procedure were encouraged and answered. The patient's mother understands and consents to the procedure. A time-out was performed prior to initiating the procedure. A 5-French catheter was then advanced through the patient's mouth under fluoroscopy into the esophagus and to the level of the stomach. This catheter was used to insufflate the stomach with air under fluoroscopy. The abdominal wall was prepped with chlorhexidine in a sterile fashion, and a sterile drape was applied covering the operative field. A sterile gown and sterile gloves were used for the procedure. Local anesthesia was provided with 1% Lidocaine. A skin incision was made in the upper abdominal wall. Under fluoroscopy, an 18 gauge trocar needle was advanced into the stomach. Contrast injection was performed to confirm intraluminal position of the needle tip. A single T tack was then deployed in the lumen of the stomach. This was brought up to tension at the skin surface. Over a guidewire, a 9-French sheath was advanced into the lumen of the stomach. The wire was left in place as a safety wire. A loop snare device from a percutaneous gastrostomy kit was then advanced into the stomach. A floppy guide wire was advanced through the orogastric catheter under fluoroscopy in the stomach. The loop snare was advanced through the percutaneous gastric access was used to snare the guide wire. This allowed withdrawal of the  loop snare out of the patient's mouth by retraction of the orogastric catheter and wire. A 20-French bumper retention gastrostomy tube was looped around the snare device. It was then pulled back through th

## 2021-11-30 NOTE — Progress Notes (Signed)
Patient discharged off of unit with all belongings. Discharge papers/instructions explained by physician assistant to family. Patient and family have no further questions at time of discharge. No complications noted at this time.  Brand [name]  
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
## 2021-11-30 NOTE — Progress Notes (Signed)
Inpatient Rehabilitation Care Coordinator ?Discharge Note  ? ?Patient Details  ?Name: Paul [name] ?MRN: [id number] ?Date of Birth: Oct 06, 1985 ? ? ?Discharge location: Home ? ?Length of Stay: 17 days ? ?Discharge activity level: sup/minA ? ?Home/community participation: mother, father and other family members ? ?Patient response NL:GXQJJH Literacy - How often do you need to have someone help you when you read instructions, pamphlets, or other written material from your doctor or pharmacy?: Always ? ?Patient response ER:DEYCXK Isolation - How often do you feel lonely or isolated from those around you?: Never ? ?Services provided included: MD, RD, PT, OT, SLP, RN, CM, TR, Pharmacy, SW ? ?Financial Services:  ?Field seismologist Utilized: Other (Comment) (uninsured) ?  ? ?Choices offered to/list presented to: n/a ? ?Follow-up services arranged:  ?Other (Comment) (HEP) ?   ?  ?  ?  ? ?Patient response to transportation need: ?Is the patient able to respond to transportation needs?: Yes ?In the past 12 months, has lack of transportation kept you from medical appointments or from getting medications?: No ?In the past 12 months, has lack of transportation kept you from meetings, work, or from getting things needed for daily living?: No ? ? ? ?Comments (or additional information): ? ?Patient/Family verbalized understanding of follow-up arrangements:  Yes ? ?Individual responsible for coordination of the follow-up plan: [phone] ? ?Confirmed correct DME delivered: [name] 11/30/2021   ? ?[name] ?

## 2021-12-01 ENCOUNTER — Other Ambulatory Visit (HOSPITAL_COMMUNITY): Payer: Self-pay
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
## 2021-12-08 NOTE — Progress Notes (Signed)
Late note: ON 11/30/21, Rx for Modafinil 100 mg  #30 pills/no refills written and filled by [facility] pharmacy.  ?

## 2021-12-14 NOTE — Assessment & Plan Note (Signed)
LDL 136 10/2021, on crestor.  ?

## 2021-12-14 NOTE — Assessment & Plan Note (Signed)
A1C 5.1 1 month ago.  ?

## 2021-12-14 NOTE — Assessment & Plan Note (Addendum)
Cardiac MRI negative for Amyloidosis, cardiology follow up scheduled outpatient.  ?

## 2021-12-15 ENCOUNTER — Encounter: Payer: Self-pay | Admitting: Student

## 2021-12-15 ENCOUNTER — Other Ambulatory Visit: Payer: Self-pay

## 2021-12-15 ENCOUNTER — Ambulatory Visit (INDEPENDENT_AMBULATORY_CARE_PROVIDER_SITE_OTHER): Payer: Medicaid Other | Admitting: Student

## 2021-12-15 VITALS — BP 129/89 | HR 60 | Wt 271.8 lb

## 2021-12-15 DIAGNOSIS — I619 Nontraumatic intracerebral hemorrhage, unspecified: Secondary | ICD-10-CM

## 2021-12-15 DIAGNOSIS — N189 Chronic kidney disease, unspecified: Secondary | ICD-10-CM | POA: Diagnosis not present

## 2021-12-15 DIAGNOSIS — Z131 Encounter for screening for diabetes mellitus: Secondary | ICD-10-CM | POA: Diagnosis not present

## 2021-12-15 DIAGNOSIS — R739 Hyperglycemia, unspecified: Secondary | ICD-10-CM

## 2021-12-15 DIAGNOSIS — I69391 Dysphagia following cerebral infarction: Secondary | ICD-10-CM

## 2021-12-15 DIAGNOSIS — R931 Abnormal findings on diagnostic imaging of heart and coronary circulation: Secondary | ICD-10-CM | POA: Diagnosis not present

## 2021-12-15 DIAGNOSIS — J810 Acute pulmonary edema: Secondary | ICD-10-CM

## 2021-12-15 DIAGNOSIS — I169 Hypertensive crisis, unspecified: Secondary | ICD-10-CM | POA: Diagnosis not present

## 2021-12-15 DIAGNOSIS — D751 Secondary polycythemia: Secondary | ICD-10-CM | POA: Diagnosis not present

## 2021-12-15 DIAGNOSIS — N179 Acute kidney failure, unspecified: Secondary | ICD-10-CM

## 2021-12-15 DIAGNOSIS — E7800 Pure hypercholesterolemia, unspecified: Secondary | ICD-10-CM | POA: Diagnosis not present

## 2021-12-15 NOTE — Patient Instructions (Addendum)
It was great to see you today! Thank you for choosing [facility] for your primary care. Izzy A Borke was seen for hospital follow up. ? ?Today we addressed: ?Follow up on Monday at [place]: [place], [place],  [address] [facility] Physical Medicine and Rehabilitation, Phone number: [phone] ?Please call if you do not receive any information about Neurology  ? ? ?No orders of the defined types were placed in this encounter. ? ?No orders of the defined types were placed in this encounter. ? ? ?We are checking some labs today. If they are abnormal, I will call you. If they are normal, I will send you a MyChart message (if it is active) or a letter in the mail. If you do not hear about your labs in the next 2 weeks, please call the office. ? ?You should return to our clinic No follow-ups on file.. ? ?I recommend that you always bring your medications to each appointment as this makes it easy to ensure you are on the correct medications and helps [place] not miss refills when you need them. ? ?Please arrive 15 minutes before your appointment to ensure smooth check in process.  We appreciate your efforts in making this happen. ? ?Take care and seek immediate care sooner if you develop any concerns.  ? ?Thank you for allowing me to participate in your care, ?Geremy Rister  ? ? ?

## 2021-12-15 NOTE — Assessment & Plan Note (Signed)
Stable, neurology to follow outpatient. ?

## 2021-12-15 NOTE — Assessment & Plan Note (Signed)
Continue PEG with bolus feeds and fluids orally per PMR. Follow up with PMR at next visit.  ?

## 2021-12-15 NOTE — Assessment & Plan Note (Signed)
Downtrended during hospital admission, will recheck in future to ensure remains wnl.  ?

## 2021-12-15 NOTE — Assessment & Plan Note (Signed)
No longer experiences urinary retention and had downtrending Cr during hospital stay, Repeat BMP today.  ?

## 2021-12-15 NOTE — Assessment & Plan Note (Addendum)
Continue Catapres, Norvasc 10mg , Coreg 25mg  BID, hydralazine 75 TID, HCTZ 25mg , and spironolactone 50mg . Renal U/S negative for RAS. BP 129/89 today, monitors pressure throughout the day at home. Other secondary causes of hypertension include OSA, primary aldosteronism, renal parenchymal disease, pheo, cushing's. Will need further work up to determine possible secondary cause of resistant HTN on next visit. Next step would include renin aldosterone level and possibly 24 hour metanephrines. Following up with cardiology and htn clinic outpatient.  ?

## 2021-12-15 NOTE — Assessment & Plan Note (Signed)
Stable, not following outpatient per last PMR note. ?

## 2021-12-16 LAB — BASIC METABOLIC PANEL
BUN/Creatinine Ratio: 16 (ref 9–20)
BUN: 26 mg/dL — ABNORMAL HIGH (ref 6–20)
CO2: 19 mmol/L — ABNORMAL LOW (ref 20–29)
Calcium: 9.8 mg/dL (ref 8.7–10.2)
Chloride: 97 mmol/L (ref 96–106)
Creatinine, Ser: 1.66 mg/dL — ABNORMAL HIGH (ref 0.76–1.27)
Glucose: 81 mg/dL (ref 70–99)
Potassium: 4.5 mmol/L (ref 3.5–5.2)
Sodium: 137 mmol/L (ref 134–144)
eGFR: 55 mL/min/{1.73_m2} — ABNORMAL LOW (ref >59)

## 2021-12-16 NOTE — Assessment & Plan Note (Signed)
Resolved from hospitalization.  ?

## 2021-12-20 ENCOUNTER — Encounter: Payer: Self-pay | Admitting: Physical Medicine & Rehabilitation

## 2021-12-20 ENCOUNTER — Encounter: Payer: Medicaid Other | Attending: Physical Medicine & Rehabilitation | Admitting: Physical Medicine & Rehabilitation

## 2021-12-20 ENCOUNTER — Other Ambulatory Visit: Payer: Self-pay

## 2021-12-20 VITALS — BP 119/73 | HR 78 | Ht 75.0 in | Wt 271.0 lb

## 2021-12-20 DIAGNOSIS — I619 Nontraumatic intracerebral hemorrhage, unspecified: Secondary | ICD-10-CM | POA: Insufficient documentation

## 2021-12-20 MED ORDER — CLONIDINE HCL 0.2 MG PO TABS
0.2000 mg | ORAL_TABLET | Freq: Three times a day (TID) | ORAL | 0 refills | Status: DC
  Filled 2021-12-20 – 2021-12-24 (×2): qty 90, 30d supply, fill #0

## 2021-12-20 MED ORDER — CARVEDILOL 25 MG PO TABS
25.0000 mg | ORAL_TABLET | Freq: Two times a day (BID) | ORAL | 0 refills | Status: DC
  Filled 2021-12-20 – 2021-12-24 (×2): qty 60, 30d supply, fill #0

## 2021-12-20 MED ORDER — HYDRALAZINE HCL 25 MG PO TABS
75.0000 mg | ORAL_TABLET | Freq: Three times a day (TID) | ORAL | 0 refills | Status: DC
  Filled 2021-12-20: qty 180, 20d supply, fill #0
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
## 2021-12-20 NOTE — Patient Instructions (Signed)
Continue PT and SLP when available ?Continue current medications ?

## 2021-12-22 ENCOUNTER — Other Ambulatory Visit: Payer: Self-pay

## 2021-12-24 ENCOUNTER — Other Ambulatory Visit: Payer: Self-pay

## 2021-12-27 ENCOUNTER — Other Ambulatory Visit: Payer: Self-pay

## 2021-12-27 MED ORDER — AMLODIPINE BESYLATE 10 MG PO TABS
10.0000 mg | ORAL_TABLET | Freq: Every day | ORAL | 2 refills | Status: DC
Start: 2021-12-27 — End: 2022-03-29
  Filled 2021-12-27: qty 30, 30d supply, fill #0
  Filled 2022-01-31: qty 30, 30d supply, fill #1
  Filled 2022-02-28: qty 30, 30d supply, fill #2

## 2021-12-27 MED ORDER — ROSUVASTATIN CALCIUM 20 MG PO TABS
20.0000 mg | ORAL_TABLET | Freq: Every day | ORAL | 2 refills | Status: DC
  Filled 2021-12-27: qty 30, 30d supply, fill #0
  Filled 2022-01-31: qty 30, 30d supply, fill #1
  Filled 2022-02-28: qty 30, 30d supply, fill #2

## 2021-12-27 MED ORDER — SPIRONOLACTONE 50 MG PO TABS
50.0000 mg | ORAL_TABLET | Freq: Every day | ORAL | 2 refills | Status: DC
  Filled 2021-12-27: qty 30, 30d supply, fill #0
  Filled 2022-01-31: qty 30, 30d supply, fill #1
  Filled 2022-02-28: qty 30, 30d supply, fill #2

## 2021-12-27 MED ORDER — MODAFINIL 100 MG PO TABS
100.0000 mg | ORAL_TABLET | Freq: Every day | ORAL | 0 refills | Status: DC
  Filled 2021-12-27: qty 30, 30d supply, fill #0

## 2021-12-27 MED ORDER — HYDROCHLOROTHIAZIDE 25 MG PO TABS
25.0000 mg | ORAL_TABLET | Freq: Every day | ORAL | 2 refills | Status: DC
  Filled 2021-12-27: qty 30, 30d supply, fill #0
  Filled 2022-01-31: qty 30, 30d supply, fill #1
  Filled 2022-02-28: qty 30, 30d supply, fill #2

## 2021-12-29 ENCOUNTER — Other Ambulatory Visit: Payer: Self-pay

## 2021-12-30 ENCOUNTER — Telehealth: Payer: Self-pay

## 2021-12-30 ENCOUNTER — Other Ambulatory Visit: Payer: Self-pay

## 2021-12-30 NOTE — Telephone Encounter (Signed)
Father calls nurse line in regards to Modafinil.  ? ?Father reports this was not given to them at medication pick up and he is unsure if we "forgot" to refill.  ? ?I called the pharmacy as refill was sent on 5/8. Pharmacist reports the medication was put on hold due to pricing ~110 dollars. Pharmacist suggested using a goodrx coupon at another pharmacy as they do not accept coupons.  ? ?Called father and discussed. Father reports he will see how he does without the medication for a few days. Father to call back if they want to move forward with goodrx.  ? ?[facility] has the cheapest price.  ? ? ?

## 2021-12-30 NOTE — Telephone Encounter (Signed)
Father calls nurse line requesting a swallow test.  ? ?Father reports he would like him reassessed to see if feeding tube is still needed.  ? ?Will forward to PCP.  ?

## 2021-12-31 ENCOUNTER — Other Ambulatory Visit: Payer: Self-pay | Admitting: Student

## 2021-12-31 DIAGNOSIS — I69391 Dysphagia following cerebral infarction: Secondary | ICD-10-CM

## 2021-12-31 NOTE — Progress Notes (Signed)
Speech therapy referral sent for evaluation of swallowing after severe dysphagia diagnosis requiring feeding tube from ICH.  ?

## 2022-01-05 ENCOUNTER — Encounter: Payer: Self-pay | Admitting: Speech Pathology

## 2022-01-05 ENCOUNTER — Other Ambulatory Visit: Payer: Self-pay

## 2022-01-05 ENCOUNTER — Ambulatory Visit: Payer: Medicaid Other | Attending: Family Medicine | Admitting: Speech Pathology

## 2022-01-05 DIAGNOSIS — R471 Dysarthria and anarthria: Secondary | ICD-10-CM | POA: Diagnosis not present

## 2022-01-05 DIAGNOSIS — R1312 Dysphagia, oropharyngeal phase: Secondary | ICD-10-CM | POA: Diagnosis not present
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
## 2022-01-05 NOTE — Patient Instructions (Addendum)
The hospital should call to schedule your swallow study - if you don't hear from them, call Monday - [phone] ? ?Add 2 glasses of water to increase your swallowing to 4 glasses a day spread out ? ?Use small ice chips and try to move them around in your mouth with your tongue several times a day ? ?Practice moving your tongue ? ?You are doing a great job taking a big breath and speaking on exhale ? ?Count to 20  ? ?Count by 5's to 100 ? ?Count by 10's to 100 ? ?Read 10 sentences - slowly, one word at a time - breathe in the middle of the sentence as needed ? ?Hum with pitch glides up and down - breathe first ? ?Recite things you have memorized - nursery rhymes, prayers, etc  ? ?Try to use your voice to answer yes/no's and short 1-2 words  ?

## 2022-01-06 ENCOUNTER — Other Ambulatory Visit (HOSPITAL_COMMUNITY): Payer: Self-pay | Admitting: *Deleted

## 2022-01-06 DIAGNOSIS — R1310 Dysphagia, unspecified: Secondary | ICD-10-CM

## 2022-01-10 ENCOUNTER — Encounter: Payer: Self-pay | Admitting: Speech Pathology

## 2022-01-10 ENCOUNTER — Telehealth: Payer: Self-pay | Admitting: Student

## 2022-01-10 ENCOUNTER — Other Ambulatory Visit: Payer: Self-pay

## 2022-01-10 ENCOUNTER — Other Ambulatory Visit: Payer: Self-pay | Admitting: Student

## 2022-01-10 ENCOUNTER — Ambulatory Visit: Payer: Medicaid Other | Admitting: Speech Pathology

## 2022-01-10 DIAGNOSIS — R471 Dysarthria and anarthria: Secondary | ICD-10-CM | POA: Diagnosis not present

## 2022-01-10 DIAGNOSIS — R1312 Dysphagia, oropharyngeal phase: Secondary | ICD-10-CM

## 2022-01-10 MED ORDER — HYDRALAZINE HCL 25 MG PO TABS
75.0000 mg | ORAL_TABLET | Freq: Three times a day (TID) | ORAL | 2 refills | Status: DC
  Filled 2022-01-10: qty 180, 20d supply, fill #0

## 2022-01-10 NOTE — Telephone Encounter (Signed)
Patient's father came in stating that he needs a refill on his blood pressure medicine, he is completely out.

## 2022-01-10 NOTE — Patient Instructions (Signed)
Loud AH 10x (or eee) with a big breath before each one  Pitch glides up and down 10x  Count 1-20  Days of the Week  Months of the year   Read 10 sentences from the sheet  Can you get me a drink?  Where is my phone?  Call my phone, please  Make me some soup  Hey Mikaylah  Hey Kim  Good morning, Grandma  I use a playstation  How are you, DJ?  Hi Bently!  Kit [employer] the [employer] the Can  Try K with some vowels, [place], [place], [place], [place], [place], [place],
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
## 2022-01-12 ENCOUNTER — Ambulatory Visit (HOSPITAL_COMMUNITY)
Admission: RE | Admit: 2022-01-12 | Discharge: 2022-01-12 | Disposition: A | Discharge status: Home / Self Care | Payer: Medicaid Other | Source: Ambulatory Visit | Attending: Family Medicine | Admitting: Family Medicine

## 2022-01-12 ENCOUNTER — Encounter: Payer: Self-pay | Admitting: Speech Pathology

## 2022-01-12 ENCOUNTER — Ambulatory Visit: Payer: Medicaid Other | Admitting: Speech Pathology

## 2022-01-12 DIAGNOSIS — R1310 Dysphagia, unspecified: Secondary | ICD-10-CM

## 2022-01-12 DIAGNOSIS — R471 Dysarthria and anarthria: Secondary | ICD-10-CM | POA: Insufficient documentation

## 2022-01-12 DIAGNOSIS — R1312 Dysphagia, oropharyngeal phase: Secondary | ICD-10-CM

## 2022-01-12 NOTE — Patient Instructions (Addendum)
  Dysphagia 1, or pure  Bite swallow -swallow-sip (swallow hard)  Take a bite, swallow hard  Dry swallow  Take a liquid sip  Feel free to bring in any drinks or puree foods you want in ST otherwise we will practice with applesauce and water each time  The best exercise for swallowing is swallowing   Hard swallow 20x twice a day - you can use water with this  Chin Tuck Against Resistance - place a 4-5 inch ball, pool noodle or rolled up towel under your chin and hold it there for 45 seconds 3x a day  Pulse your chin on the ball 20x twice a day
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
## 2022-01-12 NOTE — Progress Notes (Signed)
Modified Barium Swallow Progress Note  Patient Details  Name: Paul [name] MRN: [id number] Date of Birth: [birth year]  Today's Date: 01/12/2022  Modified Barium Swallow completed.  Full report located under Chart Review in the Imaging Section.  Brief recommendations include the following:  Clinical Impression  Pt's overall swallow function is improved compared to that noted during the MBS on 11/29/21. Pt presents with oropharyngeal dysphagia characterized by reduction in bolus cohesion, lingual retraction, anterior laryngeal movement, and pharyngeal constriction. He demonstrated impaired mastication, difficulty with posterior bolus propulsion, vallecular residue with solids, and pyriform sinus residue, Mastication of a nutrigrain bar was difficult and pt ultimately spat out the majority of the bolus. Pt was unable to demonstrate adequate posterior propulsion of a 85mm barium tablet despite efforts and his independent use of a posterior head tilt. Vallecular residue was most signicant with a minimally masticated bolus of dysphagia 2 solids, but a liquid wash wash effectively reduced it to a functional level. Pyriform sinus residue was noted across solid and liquid consistencies. No functional benefit was noted with left and right head turns. Penetration (PAS 3) and trace silent aspiration (PAS 8) were noted with thin liquids when a head turn was attempted. Prompted coughing mobilized aspirate to the laryngeal surface of the epiglottis. With prompts to demonstrate a stronger cough, pt was able to fully expell material from the larynx. Secondary swallows reduced, but did not eliminate pyriform sinus residue with thin liquids. A dysphagia 1 diet with thin liquids is recommended at this time. Continued outpatient SLP services are recommended for dysarthria and dysphagia.   Swallow Evaluation Recommendations       SLP Diet Recommendations: Dysphagia 1 (Puree) solids;Thin liquid   Liquid Administration  via: Cup;Straw   Medication Administration: Via alternative means /crushed with puree       Compensations: Minimize environmental distractions;Small sips/bites;Slow rate;Monitor for anterior loss;Follow solids with liquid       Oral Care Recommendations: Oral care BID     [name] I. Vear Clock, MS, CCC-SLP Acute Rehabilitation Services Office number [phone] Pager [phone]    [name] 01/12/2022,1:16 PM

## 2022-01-14 ENCOUNTER — Other Ambulatory Visit: Payer: Self-pay

## 2022-01-14 MED ORDER — OSMOLITE 1.5 CAL PO LIQD
356.0000 mL | Freq: Four times a day (QID) | ORAL | 1 refills | Status: DC
Start: 2022-01-14 — End: 2022-01-15

## 2022-01-14 NOTE — Telephone Encounter (Signed)
Patients father calls nurse line requesting Osmolite 1.5 Cal feeding supplements.   Father reports he was discharged home with "a lot of samples" however now he is running low.   Will forward to PCP.

## 2022-01-15 ENCOUNTER — Other Ambulatory Visit: Payer: Self-pay

## 2022-01-15 ENCOUNTER — Telehealth: Payer: Self-pay | Admitting: Family Medicine

## 2022-01-15 ENCOUNTER — Emergency Department (HOSPITAL_COMMUNITY)
Admission: EM | Admit: 2022-01-15 | Discharge: 2022-01-15 | Disposition: A | Discharge status: Home / Self Care | Payer: Medicaid Other | Attending: Emergency Medicine | Admitting: Emergency Medicine

## 2022-01-15 ENCOUNTER — Emergency Department (HOSPITAL_COMMUNITY): Payer: Medicaid Other

## 2022-01-15 ENCOUNTER — Other Ambulatory Visit (HOSPITAL_COMMUNITY): Payer: Self-pay

## 2022-01-15 DIAGNOSIS — T82848A Pain from vascular prosthetic devices, implants and grafts, initial encounter: Secondary | ICD-10-CM | POA: Diagnosis not present

## 2022-01-15 DIAGNOSIS — I10 Essential (primary) hypertension: Secondary | ICD-10-CM | POA: Diagnosis not present

## 2022-01-15 DIAGNOSIS — Z79899 Other long term (current) drug therapy: Secondary | ICD-10-CM | POA: Diagnosis not present

## 2022-01-15 DIAGNOSIS — K9423 Gastrostomy malfunction: Secondary | ICD-10-CM | POA: Diagnosis not present

## 2022-01-15 DIAGNOSIS — K9429 Other complications of gastrostomy: Secondary | ICD-10-CM

## 2022-01-15 IMAGING — DX DG ABDOMEN 1V
1 series · 1 of 1 positions shown · non-contrast
Comparison: None Available.

CLINICAL DATA: G-tube check

EXAM:
ABDOMEN - 1 VIEW

[abdomen supine]
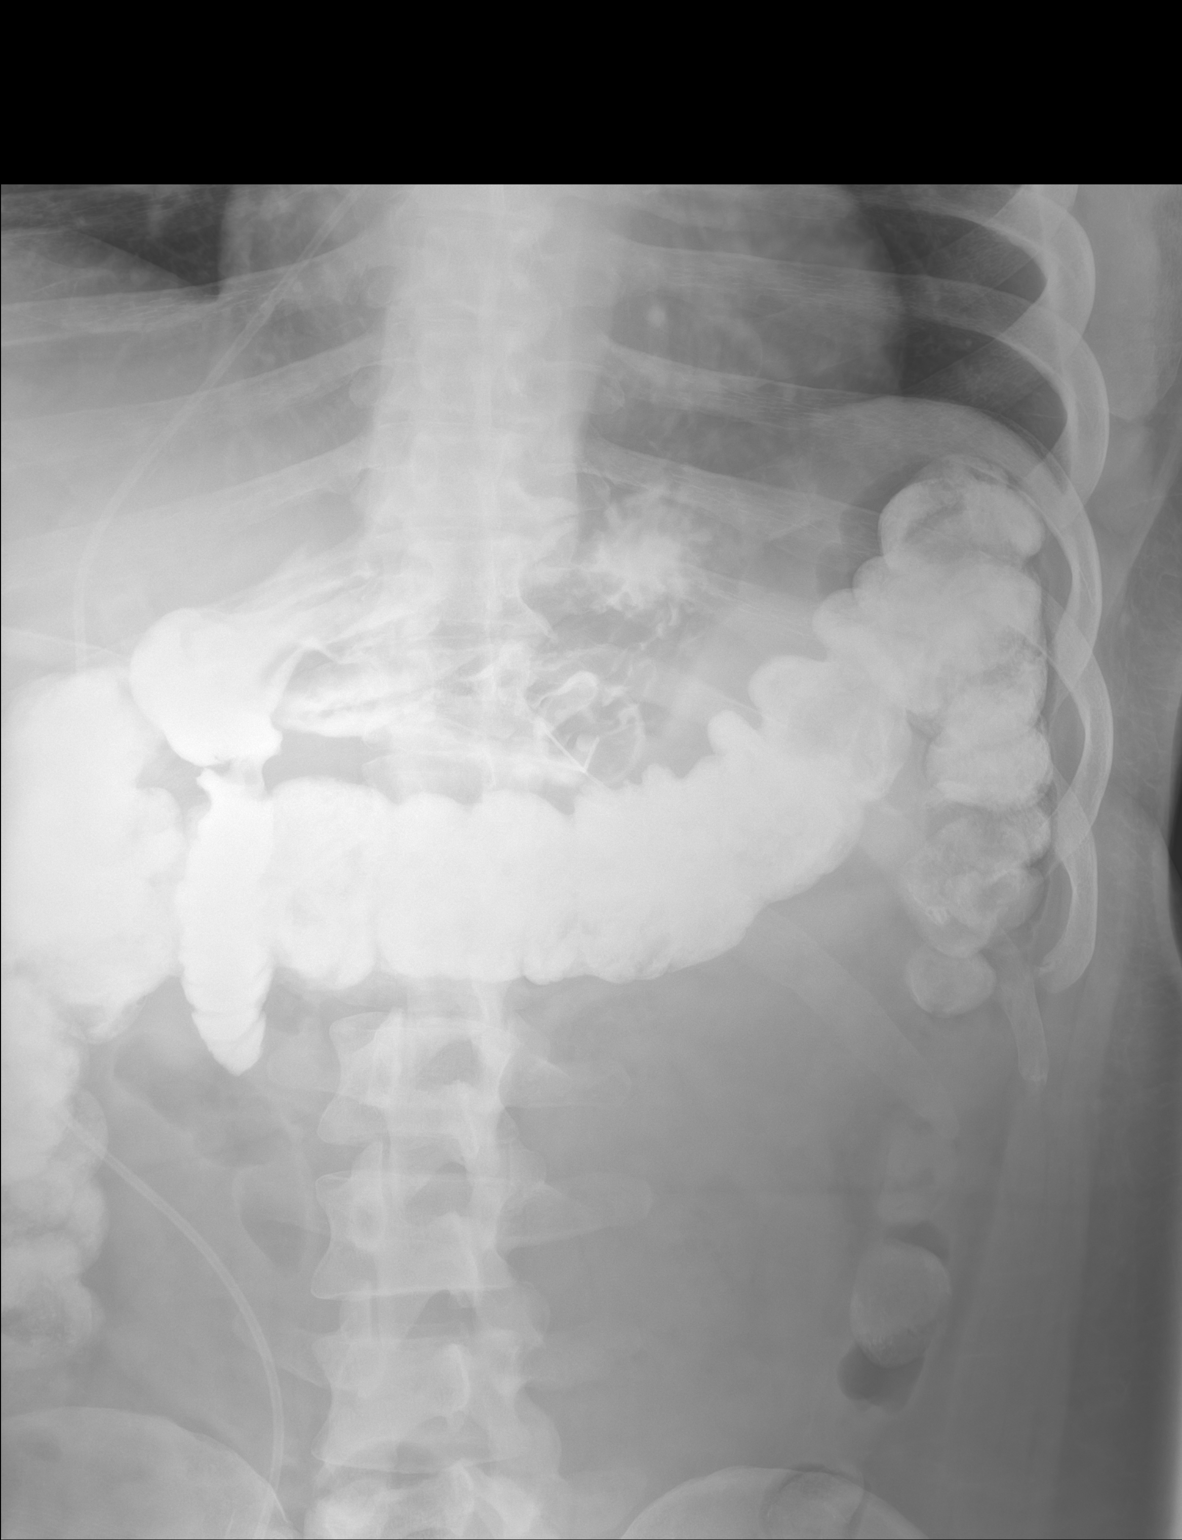

[1 of 1 positions shown; findings below may reference images not displayed]

FINDINGS: Gastrostomy tube is positioned with tip and balloon projecting over
the gastric body. The gastric lumen and proximal duodenum are
opacified without evidence of extraluminal contrast or free air.
There is additional previously administered contrast within the
colon.
IMPRESSION: Gastrostomy tube is positioned with tip and balloon projecting over
the gastric body. No evidence of extraluminal contrast.

## 2022-01-15 MED ORDER — IOHEXOL 300 MG/ML  SOLN
50.0000 mL | Freq: Once | INTRAMUSCULAR | Status: AC | PRN
  Administered 2022-01-15: 30 mL

## 2022-01-15 MED ORDER — OSMOLITE 1.5 CAL PO LIQD
356.0000 mL | Freq: Four times a day (QID) | ORAL | 0 refills | Status: AC
  Filled 2022-01-15: qty 5000, 4d supply, fill #0

## 2022-01-15 NOTE — Discharge Instructions (Addendum)
It was a pleasure caring for you today in the emergency department.  Please follow-up with [place] radiology regarding your gastrostomy tube  Please return to the emergency department for any worsening or worrisome symptoms.

## 2022-01-15 NOTE — ED Notes (Signed)
X-Ray at bedside.

## 2022-01-15 NOTE — ED Triage Notes (Signed)
Pt arrives with caregiver for eval of potentially displaced G tube. Patient had this placed at the end of March. Caregiver reports it is functioning normally but the tube is malpositioned and digging into the patient's abdomen around the insertion site. Pt endorses minimal pain to same.

## 2022-01-15 NOTE — ED Notes (Signed)
Non adhesive gauze was split & placed under the square guard that is irritating pt's skin.

## 2022-01-15 NOTE — Telephone Encounter (Signed)
AFTER HOURS EMERGENCY CALL  Returned call to [phone]. Dad answered the phone and verified patient DOB. His father reports two problems; first, his G tube has become displaced and second, he is out of feeding supplement because his normal pharmacy is closed for the holiday weekend.   G tube Dad reports the plastic flange edges that normally rest on the outside of the abdomen have worked their way into the wound itself. Dad just noticed this yesterday. Patient otherwise well appearing and the G tube is functioning well. No surrounding redness, swelling, streaking, warmth, or overt pus drainage. Given the holiday weekend, I recommended the patient go to the emergency room for re-adjustment.   Feeding supplements Dad reports patient is completely out of his Osmolite feeding supplement that is administered via G tube QID. He was going to pick it up at the pharmacy today but the pharmacy is unexpectedly closed for the holiday weekend.   I have called both [facility] and [facility] who confirm that no [facility] or [facility] carries this formula at any location within 20 miles.   I have contacted [facility], who does not carry in stock but is checking if they can obtain a limited supply from the inpatient pharmacy to provide this patient with nutrition until Tuesday.   Fayette Pho, MD
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
## 2022-01-15 NOTE — Telephone Encounter (Signed)
ADDENDUM  [place] will be able to obtain 5L osmolite 1.5 cal from the [facility] Estimated cost $241.90, although may not be exact. Called patient's dad to relay information. He expressed understanding and thanks.   Ezequiel Essex, MD

## 2022-01-18 ENCOUNTER — Other Ambulatory Visit: Payer: Self-pay

## 2022-01-18 ENCOUNTER — Ambulatory Visit: Payer: Medicaid Other | Admitting: Speech Pathology

## 2022-01-18 DIAGNOSIS — R1312 Dysphagia, oropharyngeal phase: Secondary | ICD-10-CM

## 2022-01-18 DIAGNOSIS — R471 Dysarthria and anarthria: Secondary | ICD-10-CM
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
## 2022-01-19 ENCOUNTER — Encounter: Payer: Self-pay | Admitting: Speech Pathology

## 2022-01-19 ENCOUNTER — Ambulatory Visit: Payer: Medicaid Other | Admitting: Speech Pathology

## 2022-01-19 DIAGNOSIS — R1312 Dysphagia, oropharyngeal phase: Secondary | ICD-10-CM | POA: Diagnosis not present

## 2022-01-19 DIAGNOSIS — R471 Dysarthria and anarthria: Secondary | ICD-10-CM

## 2022-01-19 NOTE — Patient Instructions (Signed)
   Bring bottle of water to therapy  Have water near by when you practice   It is good to get outside - you can walk around the [place] a few times or get dad to take you to a park with a paved trail ([place] and let you walk independently for a short time  Start back on ginger tea - it is good for your digestion  Smoothies are OK - sip swallow swallow  [place] mashed soft   See if there are any movies you can go to the theater and see if you are up for it  Try to get out a little and get back to some things you enjoyed - see if a friend or sis can take you out
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
## 2022-01-21 ENCOUNTER — Other Ambulatory Visit: Payer: Self-pay | Admitting: Student

## 2022-01-21 ENCOUNTER — Other Ambulatory Visit (HOSPITAL_COMMUNITY): Payer: Self-pay

## 2022-01-21 ENCOUNTER — Telehealth: Payer: Self-pay

## 2022-01-21 DIAGNOSIS — I69391 Dysphagia following cerebral infarction: Secondary | ICD-10-CM

## 2022-01-21 MED ORDER — OSMOLITE 1.5 CAL PO LIQD
356.0000 mL | Freq: Four times a day (QID) | ORAL | 3 refills | Status: DC
  Filled 2022-01-21: qty 10000, 7d supply, fill #0

## 2022-01-21 NOTE — Telephone Encounter (Signed)
Father calls nurse line requesting PCP opinion on Liquid Hope [employer] Feeding Tube Formula.   Father reports Osmolite has too may carbs and has been notably upsetting patients stomach.  Father would like to try Liquid Hope brand.   Father reports if PCP approves he will obtain online as patient does not have current insurance at this time.   Will forward to PCP for advisement.

## 2022-01-21 NOTE — Telephone Encounter (Signed)
Patients father contacted and advised we consulting nutrition.   Patient is completely out of Osmolite. Can we send a refill on [place].   Will forward to PCP.

## 2022-01-24 ENCOUNTER — Other Ambulatory Visit (HOSPITAL_COMMUNITY): Payer: Self-pay

## 2022-01-24 ENCOUNTER — Ambulatory Visit: Payer: Medicaid Other | Attending: Family Medicine | Admitting: Speech Pathology

## 2022-01-24 ENCOUNTER — Other Ambulatory Visit: Payer: Self-pay

## 2022-01-24 ENCOUNTER — Encounter: Payer: Self-pay | Admitting: Speech Pathology

## 2022-01-24 DIAGNOSIS — R471 Dysarthria and anarthria: Secondary | ICD-10-CM | POA: Diagnosis not present

## 2022-01-24 DIAGNOSIS — R1312 Dysphagia, oropharyngeal phase: Secondary | ICD-10-CM | POA: Insufficient documentation

## 2022-01-24 NOTE — Patient Instructions (Signed)
You have been issued a Respiratory Muscle Strength Trainer(s) to increase lung strength for improved speech and/or swallowing abilities. Please follow the instructions below provided by your Speech-Language Pathologist to complete your exercises.  Expiratory Muscle Training  Blue Device - "Blue Equals Blow"   Perform   5 repetitions, 5 sets, 3 times per day (total of 75x) Device set at 10 cm H20  How to perform: Plug your nose and use your hand for lip seal Take in a big breath to fill your lungs, then blow hard into the trainer (device) Rest for at least 2 seconds between repetitions  What you should feel/hear: Burst of air through the device  Inspiratory Muscle Training Clear Device - "Suck in"  Perform 5 repetitions for 5 sets 3 times per day (for a total of 75 a day) Device set at 19cm H2O  How to perform:  Plug nose Breathe out all of your air Place trainer in your mouth and then suck into the trainer (device) Rest for at least 2 seconds between repetitions  What you should feel/hear: Burst of air through the device  Take a break or discontinue use if you experience lightheadedness, dizziness, pain, shortness of breath, color change, or excessive sweating. Any questions, call [phone]. Please bring your device(s) to therapy sessions.
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
## 2022-01-25 ENCOUNTER — Encounter: Payer: Self-pay | Admitting: *Deleted

## 2022-01-26 ENCOUNTER — Encounter: Payer: Self-pay | Admitting: Speech Pathology

## 2022-01-26 ENCOUNTER — Ambulatory Visit: Payer: Medicaid Other | Admitting: Speech Pathology

## 2022-01-26 DIAGNOSIS — R471 Dysarthria and anarthria: Secondary | ICD-10-CM | POA: Diagnosis not present

## 2022-01-26 DIAGNOSIS — R1312 Dysphagia, oropharyngeal phase: Secondary | ICD-10-CM | POA: Diagnosis not present

## 2022-01-26 NOTE — Patient Instructions (Addendum)
   Keep doing the respiratory trainers and bring them back next week. We will work up to the EMST 150   Velopharyngeal insufficiency - may required an ENT consult  [employer] podcast on youtube  [employer] on Instagram  Stroke recovery on instagram  Strokesupport  Recoveryafterstroke  EMST [place]  DaDaDa DeeDeeDee DoeDoeDoe DoDoDo  If you are really struggling to be understood, plug your nose
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
## 2022-01-28 ENCOUNTER — Other Ambulatory Visit: Payer: Self-pay

## 2022-01-28 MED ORDER — HYDRALAZINE HCL 25 MG PO TABS
75.0000 mg | ORAL_TABLET | Freq: Three times a day (TID) | ORAL | 2 refills | Status: DC
  Filled 2022-01-28: qty 180, 20d supply, fill #0
  Filled 2022-02-17: qty 180, 20d supply, fill #1
  Filled 2022-03-15: qty 180, 20d supply, fill #2

## 2022-01-28 MED ORDER — CLONIDINE HCL 0.2 MG PO TABS
0.2000 mg | ORAL_TABLET | Freq: Three times a day (TID) | ORAL | 0 refills | Status: DC
  Filled 2022-01-28: qty 90, 30d supply, fill #0

## 2022-01-31 ENCOUNTER — Other Ambulatory Visit: Payer: Self-pay | Admitting: Physical Medicine & Rehabilitation

## 2022-01-31 ENCOUNTER — Encounter: Payer: Medicaid Other | Attending: Physical Medicine & Rehabilitation | Admitting: Physical Medicine & Rehabilitation

## 2022-01-31 ENCOUNTER — Other Ambulatory Visit: Payer: Self-pay

## 2022-01-31 MED ORDER — CARVEDILOL 25 MG PO TABS
25.0000 mg | ORAL_TABLET | Freq: Two times a day (BID) | ORAL | 0 refills | Status: DC
  Filled 2022-01-31: qty 60, 30d supply, fill #0

## 2022-02-01 ENCOUNTER — Ambulatory Visit: Payer: Medicaid Other | Admitting: Speech Pathology

## 2022-02-01 ENCOUNTER — Other Ambulatory Visit: Payer: Self-pay

## 2022-02-01 DIAGNOSIS — R471 Dysarthria and anarthria: Secondary | ICD-10-CM | POA: Diagnosis not present

## 2022-02-01 DIAGNOSIS — R1312 Dysphagia, oropharyngeal phase: Secondary | ICD-10-CM
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
## 2022-02-02 ENCOUNTER — Encounter: Payer: Self-pay | Admitting: Speech Pathology

## 2022-02-02 ENCOUNTER — Ambulatory Visit: Payer: Medicaid Other | Admitting: Speech Pathology

## 2022-02-02 ENCOUNTER — Encounter: Payer: Self-pay | Admitting: Neurology

## 2022-02-02 ENCOUNTER — Ambulatory Visit: Payer: MEDICAID | Admitting: Neurology

## 2022-02-02 DIAGNOSIS — R1312 Dysphagia, oropharyngeal phase: Secondary | ICD-10-CM | POA: Diagnosis not present

## 2022-02-02 DIAGNOSIS — R471 Dysarthria and anarthria: Secondary | ICD-10-CM | POA: Diagnosis not present

## 2022-02-02 NOTE — Patient Instructions (Addendum)
    Dr. Harriette Ohara, ENT [phone]  Dr. Lavella Lemons, ENT [facility]  Add to the 10 hard swallows twice a day to  Stick your tongue out a little bit and swallow with your tongue out - try 3-5 a day. If you can't get it, that's OK  Great job increasing your food intake - think about reducing tube feeds   Keep trying a wide variety of puree texture foods
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
## 2022-02-04 ENCOUNTER — Other Ambulatory Visit: Payer: Self-pay

## 2022-02-04 ENCOUNTER — Ambulatory Visit (INDEPENDENT_AMBULATORY_CARE_PROVIDER_SITE_OTHER): Payer: Self-pay | Admitting: Student

## 2022-02-04 ENCOUNTER — Encounter: Payer: Self-pay | Admitting: Student

## 2022-02-04 VITALS — BP 116/81 | HR 71 | Ht 75.0 in | Wt 256.2 lb

## 2022-02-04 DIAGNOSIS — I10 Essential (primary) hypertension: Secondary | ICD-10-CM

## 2022-02-04 DIAGNOSIS — N189 Chronic kidney disease, unspecified: Secondary | ICD-10-CM

## 2022-02-04 DIAGNOSIS — I69391 Dysphagia following cerebral infarction: Secondary | ICD-10-CM

## 2022-02-04 DIAGNOSIS — R931 Abnormal findings on diagnostic imaging of heart and coronary circulation: Secondary | ICD-10-CM

## 2022-02-04 DIAGNOSIS — I639 Cerebral infarction, unspecified: Secondary | ICD-10-CM

## 2022-02-04 DIAGNOSIS — N179 Acute kidney failure, unspecified: Secondary | ICD-10-CM

## 2022-02-04 DIAGNOSIS — I169 Hypertensive crisis, unspecified: Secondary | ICD-10-CM

## 2022-02-04 MED ORDER — CLONIDINE HCL 0.1 MG PO TABS: 0.1000 mg | ORAL_TABLET | Freq: Three times a day (TID) | ORAL | 1 refills | Status: DC

## 2022-02-04 MED ORDER — CLONIDINE HCL 0.1 MG PO TABS
0.1000 mg | ORAL_TABLET | Freq: Three times a day (TID) | ORAL | 1 refills | Status: DC
  Filled 2022-02-04: qty 90, 30d supply, fill #0
  Filled 2022-02-28: qty 90, 30d supply, fill #1

## 2022-02-04 NOTE — Assessment & Plan Note (Addendum)
Patient is doing quite well with taking food and medications orally.  We will continue to PEG tube in place for the next 2 to 3 weeks and monitor his eating habits.  Patient was instructed to continue eating slowly with small bites.  If he experiences coughing or choking, he was instructed to speak to healthcare personnel immediately.  If he is able to eat for the next 2 to 3 weeks, then we will feel more comfortable with removing tube.  Patient is amenable to this.  ENT referral placed to assist with develop pharyngeal dysphagia per SLP recommendations in addition to a referral to nutrition as he has been increasing his oral intake.

## 2022-02-04 NOTE — Assessment & Plan Note (Signed)
Cardiac MRI negative for amyloidosis, follow-up with cardiology already scheduled.

## 2022-02-04 NOTE — Assessment & Plan Note (Signed)
Will likely need to recheck BMP at next visit.

## 2022-02-04 NOTE — Assessment & Plan Note (Addendum)
Patient has upcoming appointment with neurology.  Given now complex medical history, CCM referral placed for assistance.

## 2022-02-04 NOTE — Progress Notes (Cosign Needed Addendum)
SUBJECTIVE:   CHIEF COMPLAINT / HPI:   ED Follow up, dysphagia: Presented to the ED for pain at g tube site on 01/15/22.  Patient has extensive medical history as he recently had ICH secondary to uncontrolled hypertension.  Over the last 1 to 2 weeks the patient has been slowly eating food orally and has not used PEG tube.  He has not needed any supplement.  He desires removal of PEG tube as soon as possible.  Patient denies any excessive coughing or choking and has been cautious while eating slowly and cutting food up into small pieces.  He also takes pills orally right now.  Past SLP note 02/02/2022:  "Velopharyngeal insufficiency continues to affect intelligibility, nasal occlusion improves intelligibility of phrases and sentences to 80-85%. ENT consult at [facility] or [facility] may needed to address this.    Ongoing RMT (IMT and EMT) for volume, cough strength and dysphagia I recommend skilled ST to maximize intelligibility of verbal communication for  safety, ease of communication and to reduce family's burden as well as to safely advance  diet textures he can take by mouth.   Consider RD consult when PO intake increases."  Hypertension: Patient is on spironolactone, clonidine, amlodipine for blood pressure.  They would like to possibly decrease these medications if able.  Patient reports that he has some increased fatigue after taking medications.  He was taking a stimulant called modafinil but is no longer taking this.    PERTINENT  PMH / PSH:  Patient Active Problem List   Diagnosis Date Noted   Nontraumatic acute hemorrhage of basal ganglia (HCC) 11/06/2021    Priority: 1.   Acute on chronic renal failure (HCC)     Priority: 4.   Dysphagia due to recent stroke 11/11/2021    Priority: 5.   Abnormal echocardiogram 11/11/2021    Priority: 6.   Polycythemia 11/11/2021    Priority: 7.   S/P percutaneous endoscopic gastrostomy (PEG) tube placement (HCC) 11/30/2021   History of  tracheostomy 11/11/2021   Cerebrovascular accident (CVA) (HCC)    Obesity 04/16/2014   Essential hypertension, benign 04/15/2014   Facial palsy 04/04/2014     OBJECTIVE:   BP 116/81   Pulse 71   Ht 6\' 3"  (1.905 m)   Wt 256 lb 3.2 oz (116.2 kg)   SpO2 100%   BMI 32.02 kg/m   General: Alert and oriented in no apparent distress; pleasant AA male with facial palsy Lungs: Normal work of breathing Abdomen: Bowel sounds present, no abdominal pain, nondistended and soft, PEG tube in place with gauze underneath.  Slight sanguinous strikethrough without signs of infection or other drainage. Skin: Warm and dry    ASSESSMENT/PLAN:   Essential hypertension, benign Continue spironolactone 50 Mg amlodipine 10 mg.  Given that the patient is experiencing fatigue likely in the setting of clonidine use, we will decrease clonidine dose to 0.1 mg 3 times daily.  Patient is diligent about checking blood pressures, I have requested that they check them frequently given his history of basal ganglia ICH in the setting of uncontrolled hypertension.  We will be very slow with discontinuing antihypertensives.  Cerebrovascular accident (CVA) [facility]) Patient has upcoming appointment with neurology.  Given now complex medical history, CCM referral placed for assistance.  Dysphagia due to recent stroke Patient is doing quite well with taking food and medications orally.  We will continue to PEG tube in place for the next 2 to 3 weeks and monitor his  eating habits.  Patient was instructed to continue eating slowly with small bites.  If he experiences coughing or choking, he was instructed to speak to healthcare personnel immediately.  If he is able to eat for the next 2 to 3 weeks, then we will feel more comfortable with removing tube.  Patient is amenable to this.  ENT referral placed to assist with develop pharyngeal dysphagia per SLP recommendations in addition to a referral to nutrition as he has been increasing  his oral intake.  Acute on chronic renal failure (HCC) Will likely need to recheck BMP at next visit.  Abnormal echocardiogram Cardiac MRI negative for amyloidosis, follow-up with cardiology already scheduled.   If the patient continues to have improvement with solely oral intake, will need a referral provided to specialist to remove tube.  Tube placement occurred in hospital, will try to determine who placed and refer.  For consideration during next appointment per pharmacy team assistance: If appropriate, please consider the additional therapy recommendations below:  - Consider collection of annual UACR  - Consider addition of an ACEi OR ARB  - Consider SGLT2 initiation for CKD treatment    Alfredo Martinez, MD [facility]

## 2022-02-04 NOTE — Patient Instructions (Addendum)
It was great to see you today! Thank you for choosing [facility] for your primary care. Paul [name] was seen for .  Today we addressed: Please decrease clonidine to 0.1 mg 3 times a day. Monitor blood pressure throughout the day Referral to dietitian provided Referral to ear nose and throat providers given If you are able to maintain slowly oral intake for the next 3 weeks, we will have your PEG tube removed by the specialists that placed it    Orders Placed This Encounter  Procedures   Ambulatory referral to ENT    Referral Priority:   Routine    Referral Type:   Consultation    Referral Reason:   Specialty Services Required    Requested Specialty:   Otolaryngology    Number of Visits Requested:   1   AMB Referral to [facility] Coordinaton    Referral Priority:   Routine    Referral Type:   Consultation    Referral Reason:   Care Coordination    Number of Visits Requested:   1   Amb ref to Medical Nutrition Therapy-MNT    Referral Priority:   Routine    Referral Type:   Consultation    Referral Reason:   Specialty Services Required    Requested Specialty:   Nutrition   Meds ordered this encounter  Medications   cloNIDine (CATAPRES) 0.1 MG tablet    Sig: Take 1 tablet (0.1 mg total) by mouth 3 (three) times daily.    Dispense:  90 tablet    Refill:  1    If you haven't already, sign up for My Chart to have easy access to your labs results, and communication with your primary care physician.  We are checking some labs today. If they are abnormal, I will call you. If they are normal, I will send you a MyChart message (if it is active) or a letter in the mail. If you do not hear about your labs in the next 2 weeks, please call the office.   You should return to our clinic Return in about 3 weeks (around 02/25/2022).  I recommend that you always bring your medications to each appointment as this makes it easy to ensure you are on the correct medications and  helps [place] not miss refills when you need them.  Please arrive 15 minutes before your appointment to ensure smooth check in process.  We appreciate your efforts in making this happen.  Please call the clinic at [phone] if your symptoms worsen or you have any concerns.  Thank you for allowing me to participate in your care, Paul [name] [employer]

## 2022-02-04 NOTE — Assessment & Plan Note (Signed)
Continue spironolactone 50 Mg amlodipine 10 mg.  Given that the patient is experiencing fatigue likely in the setting of clonidine use, we will decrease clonidine dose to 0.1 mg 3 times daily.  Patient is diligent about checking blood pressures, I have requested that they check them frequently given his history of basal ganglia ICH in the setting of uncontrolled hypertension.  We will be very slow with discontinuing antihypertensives.

## 2022-02-07 ENCOUNTER — Other Ambulatory Visit: Payer: Self-pay | Admitting: Student

## 2022-02-07 ENCOUNTER — Ambulatory Visit: Payer: Medicaid Other | Admitting: Speech Pathology

## 2022-02-07 ENCOUNTER — Encounter: Payer: Self-pay | Admitting: Speech Pathology

## 2022-02-07 DIAGNOSIS — I69391 Dysphagia following cerebral infarction: Secondary | ICD-10-CM

## 2022-02-07 DIAGNOSIS — R1312 Dysphagia, oropharyngeal phase: Secondary | ICD-10-CM

## 2022-02-07 DIAGNOSIS — R471 Dysarthria and anarthria: Secondary | ICD-10-CM | POA: Diagnosis not present

## 2022-02-07 NOTE — Patient Instructions (Signed)
   Chin tuck against resistance - roll towel, tuck chin tightly and hold the town for 45 seconds, 3x twice a day  Then use towel under chin to nod yes getting resistance 20x twice a day  This is in addition to the hard swallows and tongue out swallows  Bring respiratory trainers next session  Great job trying new foods!   Consider plain greek yogurt with mashed up fruit because it is high protein or p nut butter smoothie
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
## 2022-02-07 NOTE — Progress Notes (Signed)
Amb ref to IR placed for removal of PEG at patient request in the next couple of weeks if able to continue maintaining appropriate PO intake without need for tube. IR placed tube while patient was admitted.   Alfredo Martinez, MD

## 2022-02-08 ENCOUNTER — Telehealth: Payer: Self-pay | Admitting: Diagnostic Neuroimaging

## 2022-02-08 ENCOUNTER — Ambulatory Visit: Payer: Self-pay | Admitting: Diagnostic Neuroimaging

## 2022-02-08 ENCOUNTER — Encounter: Payer: Self-pay | Admitting: Diagnostic Neuroimaging

## 2022-02-08 ENCOUNTER — Telehealth: Payer: Self-pay

## 2022-02-08 VITALS — BP 146/96 | HR 70 | Ht 75.0 in | Wt 254.6 lb

## 2022-02-08 DIAGNOSIS — I619 Nontraumatic intracerebral hemorrhage, unspecified: Secondary | ICD-10-CM

## 2022-02-08 NOTE — Telephone Encounter (Signed)
   Telephone encounter was:  Successful.  02/08/2022 Name: Paul [name] MRN: [id number] DOB: 11/15/1985  Paul [name] is a 36 y.o. year old male who is a primary care patient of Alfredo Martinez, MD . The community resource team was consulted for assistance with  Medicaid.  Care guide performed the following interventions: Spoke with [name] listed on [facility] 12/20/21, she stated that the patient has been approved for Medicaid. I inquired if the patient needed anything else and she stated he did not.  Follow Up Plan:  No further follow up planned at this time. The patient has been provided with needed resources.  Jesicca Dipierro, AAS Paralegal, [facility] Care Guide  Embedded Care Coordination [place]  [facility]  [address] [place], [place] [address] ??[email or web address]  ?? [phone]   [email or web address]

## 2022-02-08 NOTE — Telephone Encounter (Signed)
[place] medicaid NPR sent to GI 
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
## 2022-02-09 ENCOUNTER — Encounter: Payer: Self-pay | Admitting: Speech Pathology

## 2022-02-09 ENCOUNTER — Telehealth (HOSPITAL_COMMUNITY): Payer: Self-pay | Admitting: Radiology

## 2022-02-09 ENCOUNTER — Ambulatory Visit: Payer: Medicaid Other | Admitting: Speech Pathology

## 2022-02-09 DIAGNOSIS — R1312 Dysphagia, oropharyngeal phase: Secondary | ICD-10-CM | POA: Diagnosis not present

## 2022-02-09 DIAGNOSIS — R471 Dysarthria and anarthria: Secondary | ICD-10-CM | POA: Diagnosis not present

## 2022-02-09 NOTE — Telephone Encounter (Signed)
Called pt to schedule PEG tube removal. No answer and voicemail was full. Could not leave a message! JM

## 2022-02-09 NOTE — Addendum Note (Signed)
Addended by: Henri Medal on: 02/09/2022 03:38 PM   Modules accepted: Orders
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
## 2022-02-09 NOTE — Patient Instructions (Signed)
   When you have to cough - go ahead and keep coughing - it is your body trying to keep stuff out of your lungs  Don't talk or laugh until you are done coughing  Take small sips and keep head neutral - bring the cup to you, not your head to the cup  Practice talking at home - the best exercise for talking is talking  Keep up swallow  swallow sip!!!  Respiratory trainers 75x each - try to break it up if you can to 2-3x a day

## 2022-02-09 NOTE — Progress Notes (Signed)
Spoke with Victorino Dike at [facility] and referral is incorrect and an order needs to be placed.  Order placed in Epic and she was able to see it in her work queue.  She will reach out to patient for an appt.  Miklo Aken,CMA

## 2022-02-14 ENCOUNTER — Ambulatory Visit: Payer: Medicaid Other | Admitting: Speech Pathology

## 2022-02-14 ENCOUNTER — Encounter: Payer: Self-pay | Admitting: Speech Pathology

## 2022-02-14 DIAGNOSIS — R471 Dysarthria and anarthria: Secondary | ICD-10-CM

## 2022-02-14 DIAGNOSIS — R1312 Dysphagia, oropharyngeal phase: Secondary | ICD-10-CM

## 2022-02-16 ENCOUNTER — Encounter: Payer: Self-pay | Admitting: Speech Pathology

## 2022-02-16 ENCOUNTER — Ambulatory Visit: Payer: Medicaid Other | Admitting: Speech Pathology

## 2022-02-16 DIAGNOSIS — R471 Dysarthria and anarthria: Secondary | ICD-10-CM

## 2022-02-16 DIAGNOSIS — R1312 Dysphagia, oropharyngeal phase: Secondary | ICD-10-CM | POA: Diagnosis not present

## 2022-02-16 DIAGNOSIS — H5213 Myopia, bilateral: Secondary | ICD-10-CM | POA: Diagnosis not present

## 2022-02-16 NOTE — Patient Instructions (Signed)
When you talk on the phone, let the other person know to turn off TV, noises, water and not to do chores, but just focus on your conversation  Face Time will be easier for the other person to understand you  Avoid eating hard foods at [facility] or any restaurant. There are a lot of distractions and you need to be able to focus on your swallowing  Restaurants are noisy so it may be harder to communicate - you will have to make a bigger effort to be loud and make each sound  Let's set a goal to repeat your swallow study in 3-4 weeks  Keep up the good work with the respiratory trainers - you can advance them in a week if you don't get back here, but keep the effort at about 5-6/10  If you advance them, let me know how much
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
## 2022-02-17 ENCOUNTER — Other Ambulatory Visit: Payer: Self-pay

## 2022-02-18 ENCOUNTER — Other Ambulatory Visit: Payer: Self-pay

## 2022-02-19 DIAGNOSIS — Z419 Encounter for procedure for purposes other than remedying health state, unspecified: Secondary | ICD-10-CM | POA: Diagnosis not present

## 2022-02-25 ENCOUNTER — Ambulatory Visit (HOSPITAL_COMMUNITY)
Admission: RE | Admit: 2022-02-25 | Discharge: 2022-02-25 | Disposition: A | Discharge status: Home / Self Care | Payer: Medicaid Other | Source: Ambulatory Visit | Attending: Family Medicine | Admitting: Family Medicine

## 2022-02-25 DIAGNOSIS — I69391 Dysphagia following cerebral infarction: Secondary | ICD-10-CM | POA: Diagnosis not present

## 2022-02-25 DIAGNOSIS — Z431 Encounter for attention to gastrostomy: Secondary | ICD-10-CM | POA: Insufficient documentation

## 2022-02-25 HISTORY — PX: IR GASTROSTOMY TUBE REMOVAL: IMG5492

## 2022-02-25 MED ORDER — LIDOCAINE VISCOUS HCL 2 % MT SOLN
OROMUCOSAL | Status: AC
  Filled 2022-02-25: qty 15

## 2022-02-28 ENCOUNTER — Other Ambulatory Visit: Payer: Self-pay

## 2022-02-28 ENCOUNTER — Other Ambulatory Visit: Payer: Self-pay | Admitting: Physical Medicine and Rehabilitation

## 2022-02-28 ENCOUNTER — Other Ambulatory Visit: Payer: Self-pay | Admitting: Physical Medicine & Rehabilitation

## 2022-03-01 ENCOUNTER — Other Ambulatory Visit: Payer: Self-pay | Admitting: Student

## 2022-03-01 ENCOUNTER — Other Ambulatory Visit: Payer: Self-pay

## 2022-03-01 MED ORDER — CARVEDILOL 25 MG PO TABS
25.0000 mg | ORAL_TABLET | Freq: Two times a day (BID) | ORAL | 1 refills | Status: DC
  Filled 2022-03-01: qty 60, 30d supply, fill #0
  Filled 2022-03-29: qty 60, 30d supply, fill #1

## 2022-03-02 ENCOUNTER — Ambulatory Visit: Payer: Medicaid Other | Attending: Family Medicine | Admitting: Speech Pathology

## 2022-03-02 ENCOUNTER — Encounter: Payer: Self-pay | Admitting: Speech Pathology

## 2022-03-02 DIAGNOSIS — R1312 Dysphagia, oropharyngeal phase: Secondary | ICD-10-CM | POA: Insufficient documentation

## 2022-03-02 DIAGNOSIS — R471 Dysarthria and anarthria: Secondary | ICD-10-CM | POA: Insufficient documentation

## 2022-03-02 DIAGNOSIS — R1310 Dysphagia, unspecified: Secondary | ICD-10-CM | POA: Insufficient documentation
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
## 2022-03-03 DIAGNOSIS — Z0271 Encounter for disability determination: Secondary | ICD-10-CM

## 2022-03-04 ENCOUNTER — Other Ambulatory Visit (HOSPITAL_COMMUNITY): Payer: Self-pay

## 2022-03-04 ENCOUNTER — Other Ambulatory Visit: Payer: Self-pay

## 2022-03-04 DIAGNOSIS — R1310 Dysphagia, unspecified: Secondary | ICD-10-CM

## 2022-03-07 NOTE — Telephone Encounter (Signed)
[facility] Harvey: [id number] exp. 03/03/22-05/02/22

## 2022-03-08 ENCOUNTER — Ambulatory Visit
Admission: RE | Admit: 2022-03-08 | Discharge: 2022-03-08 | Disposition: A | Discharge status: Home / Self Care | Payer: Medicaid Other | Source: Ambulatory Visit | Attending: Diagnostic Neuroimaging | Admitting: Diagnostic Neuroimaging

## 2022-03-08 DIAGNOSIS — I619 Nontraumatic intracerebral hemorrhage, unspecified: Secondary | ICD-10-CM

## 2022-03-09 ENCOUNTER — Ambulatory Visit (HOSPITAL_COMMUNITY)
Admission: RE | Admit: 2022-03-09 | Discharge: 2022-03-09 | Disposition: A | Discharge status: Home / Self Care | Payer: Medicaid Other | Source: Ambulatory Visit | Attending: Family Medicine | Admitting: Family Medicine

## 2022-03-09 ENCOUNTER — Ambulatory Visit: Payer: Medicaid Other | Admitting: Speech Pathology

## 2022-03-09 ENCOUNTER — Encounter: Payer: Self-pay | Admitting: Speech Pathology

## 2022-03-09 DIAGNOSIS — R1312 Dysphagia, oropharyngeal phase: Secondary | ICD-10-CM

## 2022-03-09 DIAGNOSIS — R471 Dysarthria and anarthria: Secondary | ICD-10-CM | POA: Insufficient documentation

## 2022-03-09 DIAGNOSIS — R1310 Dysphagia, unspecified: Secondary | ICD-10-CM | POA: Diagnosis not present

## 2022-03-09 NOTE — Patient Instructions (Signed)
  SWALLOWING EXERCISES Effortful Swallows - Squeeze hard with the muscles in your neck while you swallow your  saliva or a sip of water - Repeat 20 times, 2-3 times a day, and use whenever you eat or drink  [place]ako Swallow - swallow with your tongue sticking out - Stick tongue out and gently bite tongue with your teeth - Swallow, while holding your tongue with your teeth - Repeat 20 times, 2-3 times a day   [place] Exercise - head lift - Lie flat on your back in your bed or on a couch without pillows - Raise your head and look at your feet  - KEEP YOUR SHOULDERS DOWN - HOLD FOR 45 to 60 SECONDS, then lower your head back down - Repeat 3 times, 2-3 times a day   [employer] -  swallow as tight as you  for 5 seconds - Start to swallow, and keep your Adam's apple up by squeezing tight with the muscles of the throat - Hold the squeeze for 5-7 seconds and then relax - Repeat 20 times, 2-3 times a day   Tongue Press - Press your entire tongue as hard as you can against the roof of your mouth for 3-5 seconds - Repeat 20 times, 2-3 times a day        6. CTAR - Chin Tuck Against Resistance              - Place towel, ball or pool noodle under your chin             - Hold for 60 seconds 2-3x  a day             - Pulse up and down 20x 2-3x a day
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
## 2022-03-09 NOTE — Progress Notes (Signed)
Modified Barium Swallow Progress Note  Patient Details  Name: Paul [name] MRN: [id number] Date of Birth: 12/03/1985  Today's Date: 03/09/2022  Modified Barium Swallow completed.  Full report located under Chart Review in the Imaging Section.  Brief recommendations include the following:  Clinical Impression  Pt's swallow ability has improved from prior MBS without aspiration. Oral phase marked by decreased rotary mastication, prolonged mastication and delayed transit to posterior oral cavity with solid texture. He was able to orally contain thin boluses on lingual surface prior to propulsion. Timing of laryngeal closure was overall functional. There were 2 instances of trace laryngeal penetration on the on laryngeal surface of epiglottis. One occasion penetrate was ejected from upper portion of vestibule completely. Vallecular residue was minimal and sporadic.He continues to have pyriform sinus residue that is mild-moderate across consistencies, greater with solid texture. A cued second swallow minimally decreased residue however liquid barium was effective to reduce volume. Barium pill hesitated mid esophagus and was propelled with thin barium. No difficulty propelling pill with applesauce/barium mixture. Despite his prolonged mastication it is recommended he continue regular texture at home using caution to ensure food is moist, taking small bites and allowing additional time to masticate. Thin liquids, pills whole in puree, swallow 2 times, alternate liquids and solids. Continue ST at outpatient as appropriate.   Swallow Evaluation Recommendations       SLP Diet Recommendations: Regular solids;Thin liquid   Liquid Administration via: Straw;Cup   Medication Administration: Whole meds with puree   Supervision: Patient able to self feed;Intermittent supervision to cue for compensatory strategies   Compensations: Slow rate;Small sips/bites;Multiple dry swallows after each bite/sip;Follow  solids with liquid   Postural Changes: Seated upright at 90 degrees   Oral Care Recommendations: Oral care BID        Royce Macadamia 03/09/2022,3:15 PM

## 2022-03-11 ENCOUNTER — Other Ambulatory Visit: Payer: Self-pay

## 2022-03-11 ENCOUNTER — Other Ambulatory Visit: Payer: Self-pay | Admitting: Student

## 2022-03-12 MED ORDER — HYDROCHLOROTHIAZIDE 25 MG PO TABS
25.0000 mg | ORAL_TABLET | Freq: Every day | ORAL | 2 refills | Status: DC
  Filled 2022-03-12 – 2022-03-29 (×2): qty 30, 30d supply, fill #0
  Filled 2022-04-25: qty 30, 30d supply, fill #1

## 2022-03-14 ENCOUNTER — Other Ambulatory Visit: Payer: Self-pay

## 2022-03-15 ENCOUNTER — Other Ambulatory Visit: Payer: Self-pay

## 2022-03-16 ENCOUNTER — Encounter: Payer: Self-pay | Admitting: Speech Pathology

## 2022-03-16 ENCOUNTER — Ambulatory Visit: Payer: Medicaid Other | Admitting: Speech Pathology

## 2022-03-16 DIAGNOSIS — R1310 Dysphagia, unspecified: Secondary | ICD-10-CM | POA: Diagnosis not present

## 2022-03-16 DIAGNOSIS — R471 Dysarthria and anarthria: Secondary | ICD-10-CM | POA: Diagnosis not present

## 2022-03-16 DIAGNOSIS — R1312 Dysphagia, oropharyngeal phase: Secondary | ICD-10-CM | POA: Diagnosis not present
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
## 2022-03-16 NOTE — Patient Instructions (Addendum)
  ENT at [facility] in [place] to assess vocal folds and velo-pharyngeal function  Not an [facility] ENT in [place]  Dr. Delford Field or Dr. Rubye Oaks  Call Dr. Jena Gauss and get a referral to [facility]  Practice using speech to text on the phone and FB  After your last visit, you will have used 17 visits - you may need some ST f/u after you see the ENT at [facility]  Keep practicing swallow with tongue and and half swallow ([place]) where you hold your voice box up  Irritable larynx - you should avoid fragrances, perfumes, bleach and other harsh cleansers,  and use scent free detergent -   Increase respiratory trainers to effort of 6 or 7 out of 10 for 6 more weeks

## 2022-03-17 ENCOUNTER — Encounter: Payer: Self-pay | Admitting: Student

## 2022-03-17 ENCOUNTER — Other Ambulatory Visit: Payer: Self-pay

## 2022-03-17 DIAGNOSIS — Z9889 Other specified postprocedural states: Secondary | ICD-10-CM

## 2022-03-17 DIAGNOSIS — I69391 Dysphagia following cerebral infarction: Secondary | ICD-10-CM

## 2022-03-21 ENCOUNTER — Encounter (HOSPITAL_BASED_OUTPATIENT_CLINIC_OR_DEPARTMENT_OTHER): Payer: Self-pay | Admitting: Cardiovascular Disease

## 2022-03-21 ENCOUNTER — Ambulatory Visit (INDEPENDENT_AMBULATORY_CARE_PROVIDER_SITE_OTHER): Payer: Medicaid Other | Admitting: Cardiovascular Disease

## 2022-03-21 ENCOUNTER — Other Ambulatory Visit: Payer: Self-pay

## 2022-03-21 VITALS — BP 118/90 | HR 62 | Ht 75.0 in | Wt 248.3 lb

## 2022-03-21 DIAGNOSIS — I10 Essential (primary) hypertension: Secondary | ICD-10-CM | POA: Diagnosis not present

## 2022-03-21 DIAGNOSIS — E669 Obesity, unspecified: Secondary | ICD-10-CM | POA: Diagnosis not present

## 2022-03-21 DIAGNOSIS — Z5181 Encounter for therapeutic drug level monitoring: Secondary | ICD-10-CM

## 2022-03-21 DIAGNOSIS — E7800 Pure hypercholesterolemia, unspecified: Secondary | ICD-10-CM | POA: Diagnosis not present

## 2022-03-21 MED ORDER — VALSARTAN 40 MG PO TABS
40.0000 mg | ORAL_TABLET | Freq: Every day | ORAL | 30 refills | Status: DC
  Filled 2022-03-21: qty 90, 90d supply, fill #0

## 2022-03-21 NOTE — Assessment & Plan Note (Signed)
Continue with regular exercise and physical therapy.
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
## 2022-03-21 NOTE — Assessment & Plan Note (Signed)
Diastolic blood pressure still above goal.  In general his systolics have been better controlled.  Most of his evaluation for secondary causes has been completed.  We will check a TSH.  CT scan of the abdomen had no adenomas or adrenal hyperplasia.  He also had patent renal arteries.  He does not have any symptoms of sleep apnea.  We will work on trying to simplify his regimen if at all possible.  Ideally we will get him off clonidine to help with fatigue.  He is not on an ARB presumably due to AKI in the hospital.  Given that he did he does have CKD 3, in the long run getting him on an ARB would be renal protective.  He does not have a nephrologist.  We will start with adding valsartan 40 mg daily.  Check a BMP in a week.  Continue amlodipine, carvedilol, clonidine, hydralazine, HCTZ, and spironolactone.  In the future, would consider switching his HCTZ to chlorthalidone if needed.  We did discuss clonidine patches.  He reports that he does not have issues with remembering to take his medication 3 times a day.  He was congratulated for limiting his sodium and exercising regularly.

## 2022-03-21 NOTE — Assessment & Plan Note (Signed)
Continue rosuvastatin.  Rosuvastatin was started in the hospital in March.  He is due for repeat.  Check fasting lipids and a CMP when he comes back for labs.

## 2022-03-21 NOTE — Patient Instructions (Signed)
Medication Instructions:  START VALSARTAN 40 MG DAILY    Labwork: FASTING LP/CMET/TSH IN 1 WEEK    Testing/Procedures: NONE   Follow-Up: 04/21/2022 10:00 AM WITH PHARM D AT [facility] OFFICE    Special Instructions:   MONITOR YOUR BLOOD PRESSURE TWICE A DAY, LOG IN THE BOOK PROVIDED. BRING THE BOOK AND YOUR BLOOD PRESSURE MACHINE TO YOUR FOLLOW UP IN 1 MONTH   DASH Eating Plan DASH stands for "Dietary Approaches to Stop Hypertension." The DASH eating plan is a healthy eating plan that has been shown to reduce high blood pressure (hypertension). It may also reduce your risk for type 2 diabetes, heart disease, and stroke. The DASH eating plan may also help with weight loss. What are tips for following this plan?  General guidelines Avoid eating more than 2,300 mg (milligrams) of salt (sodium) a day. If you have hypertension, you may need to reduce your sodium intake to 1,500 mg a day. Limit alcohol intake to no more than 1 drink a day for nonpregnant women and 2 drinks a day for men. One drink equals 12 oz of beer, 5 oz of wine, or 1 oz of hard liquor. Work with your health care provider to maintain a healthy body weight or to lose weight. Ask what an ideal weight is for you. Get at least 30 minutes of exercise that causes your heart to beat faster (aerobic exercise) most days of the week. Activities may include walking, swimming, or biking. Work with your health care provider or diet and nutrition specialist (dietitian) to adjust your eating plan to your individual calorie needs. Reading food labels  Check food labels for the amount of sodium per serving. Choose foods with less than 5 percent of the Daily Value of sodium. Generally, foods with less than 300 mg of sodium per serving fit into this eating plan. To find whole grains, look for the word "whole" as the first word in the ingredient list. Shopping Buy products labeled as "low-sodium" or "no salt added." Buy fresh foods. Avoid  canned foods and premade or frozen meals. Cooking Avoid adding salt when cooking. Use salt-free seasonings or herbs instead of table salt or sea salt. Check with your health care provider or pharmacist before using salt substitutes. Do not fry foods. Cook foods using healthy methods such as baking, boiling, grilling, and broiling instead. Cook with heart-healthy oils, such as olive, canola, soybean, or sunflower oil. Meal planning Eat a balanced diet that includes: 5 or more servings of fruits and vegetables each day. At each meal, try to fill half of your plate with fruits and vegetables. Up to 6-8 servings of whole grains each day. Less than 6 oz of lean meat, poultry, or fish each day. A 3-oz serving of meat is about the same size as a deck of cards. One egg equals 1 oz. 2 servings of low-fat dairy each day. A serving of nuts, seeds, or beans 5 times each week. Heart-healthy fats. Healthy fats called Omega-3 fatty acids are found in foods such as flaxseeds and coldwater fish, like sardines, salmon, and mackerel. Limit how much you eat of the following: Canned or prepackaged foods. Food that is high in trans fat, such as fried foods. Food that is high in saturated fat, such as fatty meat. Sweets, desserts, sugary drinks, and other foods with added sugar. Full-fat dairy products. Do not salt foods before eating. Try to eat at least 2 vegetarian meals each week. Eat more home-cooked food and less restaurant,  buffet, and fast food. When eating at a restaurant, ask that your food be prepared with less salt or no salt, if possible. What foods are recommended? The items listed may not be a complete list. Talk with your dietitian about what dietary choices are best for you. Grains Whole-grain or whole-wheat bread. Whole-grain or whole-wheat pasta. Brown rice. Orpah Cobb. Bulgur. Whole-grain and low-sodium cereals. Pita bread. Low-fat, low-sodium crackers. Whole-wheat flour  tortillas. Vegetables Fresh or frozen vegetables (raw, steamed, roasted, or grilled). Low-sodium or reduced-sodium tomato and vegetable juice. Low-sodium or reduced-sodium tomato sauce and tomato paste. Low-sodium or reduced-sodium canned vegetables. Fruits All fresh, dried, or frozen fruit. Canned fruit in natural juice (without added sugar). Meat and other protein foods Skinless chicken or [place]. Ground chicken or [place]. Pork with fat trimmed off. Fish and seafood. Egg whites. Dried beans, peas, or lentils. Unsalted nuts, nut butters, and seeds. Unsalted canned beans. Lean cuts of beef with fat trimmed off. Low-sodium, lean deli meat. Dairy Low-fat (1%) or fat-free (skim) milk. Fat-free, low-fat, or reduced-fat cheeses. Nonfat, low-sodium ricotta or cottage cheese. Low-fat or nonfat yogurt. Low-fat, low-sodium cheese. Fats and oils Soft margarine without trans fats. Vegetable oil. Low-fat, reduced-fat, or light mayonnaise and salad dressings (reduced-sodium). Canola, safflower, olive, soybean, and sunflower oils. Avocado. Seasoning and other foods Herbs. Spices. Seasoning mixes without salt. Unsalted popcorn and pretzels. Fat-free sweets. What foods are not recommended? The items listed may not be a complete list. Talk with your dietitian about what dietary choices are best for you. Grains Baked goods made with fat, such as croissants, muffins, or some breads. Dry pasta or rice meal packs. Vegetables Creamed or fried vegetables. Vegetables in a cheese sauce. Regular canned vegetables (not low-sodium or reduced-sodium). Regular canned tomato sauce and paste (not low-sodium or reduced-sodium). Regular tomato and vegetable juice (not low-sodium or reduced-sodium). Rosita Fire. Olives. Fruits Canned fruit in a light or heavy syrup. Fried fruit. Fruit in cream or butter sauce. Meat and other protein foods Fatty cuts of meat. Ribs. Fried meat. Tomasa Blase. Sausage. Bologna and other processed lunch meats.  Salami. Fatback. Hotdogs. Bratwurst. Salted nuts and seeds. Canned beans with added salt. Canned or smoked fish. Whole eggs or egg yolks. Chicken or [place] with skin. Dairy Whole or 2% milk, cream, and half-and-half. Whole or full-fat cream cheese. Whole-fat or sweetened yogurt. Full-fat cheese. Nondairy creamers. Whipped toppings. Processed cheese and cheese spreads. Fats and oils Butter. Stick margarine. Lard. Shortening. Ghee. Bacon fat. Tropical oils, such as coconut, palm kernel, or palm oil. Seasoning and other foods Salted popcorn and pretzels. Onion salt, garlic salt, seasoned salt, table salt, and sea salt. Worcestershire sauce. Tartar sauce. Barbecue sauce. Teriyaki sauce. Soy sauce, including reduced-sodium. Steak sauce. Canned and packaged gravies. Fish sauce. Oyster sauce. Cocktail sauce. Horseradish that you find on the shelf. Ketchup. Mustard. Meat flavorings and tenderizers. Bouillon cubes. Hot sauce and Tabasco sauce. Premade or packaged marinades. Premade or packaged taco seasonings. Relishes. Regular salad dressings. Where to find more information: National Heart, Lung, and Blood Institute: [employer]is American Heart Association: www.heart.org Summary The DASH eating plan is a healthy eating plan that has been shown to reduce high blood pressure (hypertension). It may also reduce your risk for type 2 diabetes, heart disease, and stroke. With the DASH eating plan, you should limit salt (sodium) intake to 2,300 mg a day. If you have hypertension, you may need to reduce your sodium intake to 1,500 mg a day. When on the DASH eating plan, aim  to eat more fresh fruits and vegetables, whole grains, lean proteins, low-fat dairy, and heart-healthy fats. Work with your health care provider or diet and nutrition specialist (dietitian) to adjust your eating plan to your individual calorie needs. This information is not intended to replace advice given to you by your health care provider.  Make sure you discuss any questions you have with your health care provider. Document Released: 07/28/2011 Document Revised: 07/21/2017 Document Reviewed: 08/01/2016 Elsevier Patient Education  2020 [employer].

## 2022-03-22 DIAGNOSIS — Z419 Encounter for procedure for purposes other than remedying health state, unspecified: Secondary | ICD-10-CM | POA: Diagnosis not present

## 2022-03-23 ENCOUNTER — Ambulatory Visit: Payer: Medicaid Other | Attending: Family Medicine | Admitting: Speech Pathology

## 2022-03-23 ENCOUNTER — Encounter: Payer: Self-pay | Admitting: Speech Pathology

## 2022-03-23 DIAGNOSIS — R471 Dysarthria and anarthria: Secondary | ICD-10-CM | POA: Insufficient documentation

## 2022-03-23 DIAGNOSIS — R1312 Dysphagia, oropharyngeal phase: Secondary | ICD-10-CM | POA: Insufficient documentation
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
## 2022-03-23 NOTE — Patient Instructions (Addendum)
   Let ENT know you are concerned about nasal emission and vocal folds and dry mouth   If they recommend ST, remind them that you should have 10 visits left - they will check on it for you as well  When you feel your throat get tight or you can't talk, try to get a quick sniff then blow until the feeling passes  Keep swallowing hard when you eat and drink  Keep a list of words that you have trouble with in our phone and practice them

## 2022-03-29 ENCOUNTER — Other Ambulatory Visit: Payer: Self-pay | Admitting: Student

## 2022-03-29 ENCOUNTER — Other Ambulatory Visit: Payer: Self-pay

## 2022-03-29 DIAGNOSIS — I10 Essential (primary) hypertension: Secondary | ICD-10-CM

## 2022-03-29 MED ORDER — HYDRALAZINE HCL 25 MG PO TABS
75.0000 mg | ORAL_TABLET | Freq: Three times a day (TID) | ORAL | 2 refills | Status: DC
  Filled 2022-03-29 – 2022-03-30 (×2): qty 180, 20d supply, fill #0
  Filled 2022-04-25 – 2022-04-26 (×2): qty 180, 20d supply, fill #1
  Filled 2022-05-21: qty 180, 20d supply, fill #2

## 2022-03-29 MED ORDER — AMLODIPINE BESYLATE 10 MG PO TABS
10.0000 mg | ORAL_TABLET | Freq: Every day | ORAL | 2 refills | Status: DC
  Filled 2022-03-29: qty 30, 30d supply, fill #0
  Filled 2022-04-25: qty 30, 30d supply, fill #1
  Filled 2022-06-06: qty 30, 30d supply, fill #2

## 2022-03-29 MED ORDER — CLONIDINE HCL 0.1 MG PO TABS
0.1000 mg | ORAL_TABLET | Freq: Three times a day (TID) | ORAL | 1 refills | Status: DC
  Filled 2022-03-29: qty 90, 30d supply, fill #0

## 2022-03-29 MED ORDER — ROSUVASTATIN CALCIUM 20 MG PO TABS
20.0000 mg | ORAL_TABLET | Freq: Every day | ORAL | 2 refills | Status: DC
  Filled 2022-03-29: qty 30, 30d supply, fill #0
  Filled 2022-04-25: qty 30, 30d supply, fill #1

## 2022-03-29 MED ORDER — SPIRONOLACTONE 50 MG PO TABS
50.0000 mg | ORAL_TABLET | Freq: Every day | ORAL | 2 refills | Status: DC
  Filled 2022-03-29: qty 30, 30d supply, fill #0
  Filled 2022-04-25: qty 30, 30d supply, fill #1

## 2022-03-30 ENCOUNTER — Other Ambulatory Visit: Payer: Self-pay

## 2022-03-30 ENCOUNTER — Encounter (HOSPITAL_BASED_OUTPATIENT_CLINIC_OR_DEPARTMENT_OTHER): Payer: Self-pay | Admitting: Cardiovascular Disease

## 2022-03-31 DIAGNOSIS — I10 Essential (primary) hypertension: Secondary | ICD-10-CM | POA: Diagnosis not present

## 2022-03-31 DIAGNOSIS — Z5181 Encounter for therapeutic drug level monitoring: Secondary | ICD-10-CM | POA: Diagnosis not present

## 2022-03-31 LAB — COMPREHENSIVE METABOLIC PANEL
ALT: 15 IU/L (ref 0–44)
AST: 12 IU/L (ref 0–40)
Albumin/Globulin Ratio: 1.7 (ref 1.2–2.2)
Albumin: 4.3 g/dL (ref 4.1–5.1)
Alkaline Phosphatase: 49 IU/L (ref 44–121)
BUN/Creatinine Ratio: 13 (ref 9–20)
BUN: 21 mg/dL — ABNORMAL HIGH (ref 6–20)
Bilirubin Total: 0.4 mg/dL (ref 0.0–1.2)
CO2: 23 mmol/L (ref 20–29)
Calcium: 10 mg/dL (ref 8.7–10.2)
Chloride: 102 mmol/L (ref 96–106)
Creatinine, Ser: 1.61 mg/dL — ABNORMAL HIGH (ref 0.76–1.27)
Globulin, Total: 2.6 g/dL (ref 1.5–4.5)
Glucose: 88 mg/dL (ref 70–99)
Potassium: 5.1 mmol/L (ref 3.5–5.2)
Sodium: 142 mmol/L (ref 134–144)
Total Protein: 6.9 g/dL (ref 6.0–8.5)
eGFR: 57 mL/min/{1.73_m2} — ABNORMAL LOW (ref >59)

## 2022-03-31 LAB — LIPID PANEL
Chol/HDL Ratio: 3 ratio (ref 0.0–5.0)
Cholesterol, Total: 119 mg/dL (ref 100–199)
HDL: 40 mg/dL (ref >39)
LDL Chol Calc (NIH): 63 mg/dL (ref 0–99)
Triglycerides: 82 mg/dL (ref 0–149)
VLDL Cholesterol Cal: 16 mg/dL (ref 5–40)

## 2022-03-31 LAB — TSH: TSH: 0.027 u[IU]/mL — ABNORMAL LOW (ref 0.450–4.500)

## 2022-04-14 ENCOUNTER — Ambulatory Visit: Payer: Medicaid Other | Admitting: Skilled Nursing Facility1

## 2022-04-15 ENCOUNTER — Telehealth (HOSPITAL_BASED_OUTPATIENT_CLINIC_OR_DEPARTMENT_OTHER): Payer: Self-pay

## 2022-04-15 ENCOUNTER — Encounter: Payer: Self-pay | Admitting: Student

## 2022-04-15 NOTE — Telephone Encounter (Addendum)
Results called to patient who verbalizes understanding!    ----- Message from Alver Sorrow, NP sent at 04/12/2022  5:00 PM EDT ----- Stable kidney function. Normal electrolytes, liver. Low TSH indicating thyroid may be overactive - recommend follow up with primary care provider. Cholesterol at goal.  CCing PCP as FYI.

## 2022-04-21 ENCOUNTER — Encounter: Payer: Self-pay | Admitting: Pharmacist Clinician (PhC)/ Clinical Pharmacy Specialist

## 2022-04-21 ENCOUNTER — Ambulatory Visit: Payer: MEDICAID | Attending: Cardiology | Admitting: Pharmacist Clinician (PhC)/ Clinical Pharmacy Specialist

## 2022-04-21 DIAGNOSIS — I10 Essential (primary) hypertension: Secondary | ICD-10-CM

## 2022-04-21 NOTE — Patient Instructions (Signed)
   Check your blood pressure at home most days of the week and keep record of the readings.  Average the readings every 2-3 weeks to be sure the average BP is < 130/80.  If it goes above that, please send a MyChart message or call.  Belenda Cruise at [phone])  Take your BP meds as follows:  Decrease clonidine to 0.1 mg twice daily for 4 days, then once daily for 4 days, then stop.  Switch amlodipine to evenings  Continue all other medications.  Bring all of your meds, your BP cuff and your record of home blood pressures to your next appointment.  Exercise as you're able, try to walk approximately 30 minutes per day.  Keep salt intake to a minimum, especially watch canned and prepared boxed foods.  Eat more fresh fruits and vegetables and fewer canned items.  Avoid eating in fast food restaurants.    HOW TO TAKE YOUR BLOOD PRESSURE: Rest 5 minutes before taking your blood pressure.  Don't smoke or drink caffeinated beverages for at least 30 minutes before. Take your blood pressure before (not after) you eat. Sit comfortably with your back supported and both feet on the floor (don't cross your legs). Elevate your arm to heart level on a table or a desk. Use the proper sized cuff. It should fit smoothly and snugly around your bare upper arm. There should be enough room to slip a fingertip under the cuff. The bottom edge of the cuff should be 1 inch above the crease of the elbow. Ideally, take 3 measurements at one sitting and record the average.

## 2022-04-21 NOTE — Assessment & Plan Note (Signed)
Patient with resistant hypertension on 6 medications.  Home blood pressures have improved, although diastolic readings are just now coming down into goal range.  Reviewed concern for swallowing amlodipine and suggested he can crush tablet in small amount of applesauce or other foods.  He will keep this in mind should the problem worsen.  Because his pressure is looking much better, we will taper off clonidine - decrease to twice daily for 4 days then daily for 4 more.  Hopefully this will reduce problems with daytime drowsiness.  He will continue to monitor home readings and I asked that he reach out if home readings begin to average > 130/80.
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
## 2022-04-22 ENCOUNTER — Encounter: Payer: Self-pay | Admitting: Student

## 2022-04-22 DIAGNOSIS — Z419 Encounter for procedure for purposes other than remedying health state, unspecified: Secondary | ICD-10-CM | POA: Diagnosis not present

## 2022-04-26 ENCOUNTER — Other Ambulatory Visit: Payer: Self-pay

## 2022-04-26 ENCOUNTER — Other Ambulatory Visit: Payer: Self-pay | Admitting: Student

## 2022-04-26 ENCOUNTER — Encounter: Payer: Self-pay | Admitting: Speech Pathology

## 2022-04-27 ENCOUNTER — Other Ambulatory Visit: Payer: Self-pay

## 2022-04-27 DIAGNOSIS — F800 Phonological disorder: Secondary | ICD-10-CM | POA: Diagnosis not present

## 2022-04-27 DIAGNOSIS — R490 Dysphonia: Secondary | ICD-10-CM | POA: Diagnosis not present

## 2022-04-27 MED ORDER — CARVEDILOL 25 MG PO TABS
25.0000 mg | ORAL_TABLET | Freq: Two times a day (BID) | ORAL | 1 refills | Status: DC
  Filled 2022-04-27: qty 60, 30d supply, fill #0
  Filled 2022-05-21: qty 60, 30d supply, fill #1

## 2022-04-28 ENCOUNTER — Telehealth: Payer: Self-pay

## 2022-04-28 ENCOUNTER — Encounter: Payer: Self-pay | Admitting: Speech Pathology

## 2022-04-28 NOTE — Telephone Encounter (Signed)
-----   Message from Suanne Marker, MD sent at 04/27/2022  5:58 PM EDT ----- Resolution of prior hemorrhage.  No new abnormalities.  Please call patient. Continue current plan. -VRP

## 2022-04-28 NOTE — Telephone Encounter (Signed)
Called pt to discuss results. He understood and would call if he had any questions.

## 2022-05-03 ENCOUNTER — Encounter: Payer: Self-pay | Admitting: Dietician

## 2022-05-07 ENCOUNTER — Encounter (HOSPITAL_BASED_OUTPATIENT_CLINIC_OR_DEPARTMENT_OTHER): Payer: Self-pay | Admitting: Cardiovascular Disease

## 2022-05-16 ENCOUNTER — Ambulatory Visit (INDEPENDENT_AMBULATORY_CARE_PROVIDER_SITE_OTHER): Payer: Medicaid Other | Admitting: Student

## 2022-05-16 ENCOUNTER — Other Ambulatory Visit: Payer: Self-pay

## 2022-05-16 ENCOUNTER — Encounter: Payer: Self-pay | Admitting: Student

## 2022-05-16 VITALS — BP 122/82 | HR 83 | Ht 75.0 in | Wt 245.8 lb

## 2022-05-16 DIAGNOSIS — I69391 Dysphagia following cerebral infarction: Secondary | ICD-10-CM | POA: Diagnosis not present

## 2022-05-16 DIAGNOSIS — R7989 Other specified abnormal findings of blood chemistry: Secondary | ICD-10-CM | POA: Diagnosis not present

## 2022-05-16 DIAGNOSIS — I10 Essential (primary) hypertension: Secondary | ICD-10-CM | POA: Diagnosis not present

## 2022-05-16 DIAGNOSIS — I639 Cerebral infarction, unspecified: Secondary | ICD-10-CM

## 2022-05-16 DIAGNOSIS — I1A0 Resistant hypertension: Secondary | ICD-10-CM

## 2022-05-16 DIAGNOSIS — N179 Acute kidney failure, unspecified: Secondary | ICD-10-CM

## 2022-05-16 DIAGNOSIS — N189 Chronic kidney disease, unspecified: Secondary | ICD-10-CM | POA: Diagnosis not present

## 2022-05-16 MED ORDER — VALSARTAN 80 MG PO TABS
80.0000 mg | ORAL_TABLET | Freq: Every day | ORAL | 3 refills | Status: DC
  Filled 2022-05-16: qty 30, 30d supply, fill #0
  Filled 2022-06-20: qty 30, 30d supply, fill #1
  Filled 2022-07-20: qty 30, 30d supply, fill #2
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
## 2022-05-16 NOTE — Assessment & Plan Note (Addendum)
Maintaining oral intake S/p PEG tube removal  Seeing [facility]

## 2022-05-16 NOTE — Patient Instructions (Addendum)
It was great to see you today! Thank you for choosing [facility] for your primary care. Mathias A Magallon was seen for HTN follow up.  Today we addressed: Continuing with Valsartan 80 mg, Amlodipine 10 mg, carvedilol 25 BID, hydralazine 75mg  three times a day We can hold off on the HCTZ, spironolactone, and rosuvastatin for now  Please continue checking your pressure and follow up with cardiology.   If you haven't already, sign up for My Chart to have easy access to your labs results, and communication with your primary care physician.  We are checking some labs today. If they are abnormal, I will call you. If they are normal, I will send you a MyChart message (if it is active) or a letter in the mail. If you do not hear about your labs in the next 2 weeks, please call the office. I recommend that you always bring your medications to each appointment as this makes it easy to ensure you are on the correct medications and helps [place] not miss refills when you need them. Call the clinic at [phone] if your symptoms worsen or you have any concerns.  You should return to our clinic Return in about 2 weeks (around 05/30/2022) for HTN f/u . Please arrive 15 minutes before your appointment to ensure smooth check in process.  We appreciate your efforts in making this happen.  Thank you for allowing me to participate in your care, Erskine Emery, MD 05/16/2022, 1:59 PM PGY-2, [place]

## 2022-05-17 ENCOUNTER — Encounter: Payer: Self-pay | Admitting: Student

## 2022-05-17 ENCOUNTER — Other Ambulatory Visit: Payer: Self-pay

## 2022-05-17 LAB — T4F: T4,Free (Direct): 4.28 ng/dL — ABNORMAL HIGH (ref 0.82–1.77)

## 2022-05-17 LAB — LIPID PANEL
Chol/HDL Ratio: 3 ratio (ref 0.0–5.0)
Cholesterol, Total: 123 mg/dL (ref 100–199)
HDL: 41 mg/dL (ref >39)
LDL Chol Calc (NIH): 68 mg/dL (ref 0–99)
Triglycerides: 66 mg/dL (ref 0–149)
VLDL Cholesterol Cal: 14 mg/dL (ref 5–40)

## 2022-05-17 LAB — MICROALBUMIN / CREATININE URINE RATIO
Creatinine, Urine: 192.8 mg/dL
Microalb/Creat Ratio: 5 mg/g creat (ref 0–29)
Microalbumin, Urine: 9.8 ug/mL

## 2022-05-17 LAB — TSH RFX ON ABNORMAL TO FREE T4: TSH: <0.005 u[IU]/mL — ABNORMAL LOW (ref 0.450–4.500)

## 2022-05-18 DIAGNOSIS — E0590 Thyrotoxicosis, unspecified without thyrotoxic crisis or storm: Secondary | ICD-10-CM | POA: Insufficient documentation

## 2022-05-18 DIAGNOSIS — R7989 Other specified abnormal findings of blood chemistry: Secondary | ICD-10-CM | POA: Insufficient documentation

## 2022-05-18 NOTE — Assessment & Plan Note (Signed)
Encouraged keeping specialist follow up appts.

## 2022-05-18 NOTE — Assessment & Plan Note (Signed)
BP: 122/82 today. Well controlled. Continue to work on healthy dietary habits and exercise. Follow up in 3 weeks.   Medication regimen: amlodipine, carvedilol, hydralazine, valsartan increased to 80 mg   Check UACr, hold on SGLT2 as he has difficulty with medication burden and side effects.

## 2022-05-18 NOTE — Assessment & Plan Note (Signed)
Repeat today

## 2022-05-18 NOTE — Assessment & Plan Note (Signed)
Stable today, increased ARB

## 2022-05-22 DIAGNOSIS — Z419 Encounter for procedure for purposes other than remedying health state, unspecified: Secondary | ICD-10-CM | POA: Diagnosis not present

## 2022-05-23 ENCOUNTER — Other Ambulatory Visit: Payer: Self-pay

## 2022-06-07 ENCOUNTER — Other Ambulatory Visit: Payer: Self-pay

## 2022-06-07 ENCOUNTER — Other Ambulatory Visit (HOSPITAL_COMMUNITY): Payer: Self-pay

## 2022-06-08 ENCOUNTER — Ambulatory Visit (INDEPENDENT_AMBULATORY_CARE_PROVIDER_SITE_OTHER): Payer: Medicaid Other | Admitting: Student

## 2022-06-08 ENCOUNTER — Encounter: Payer: Self-pay | Admitting: Student

## 2022-06-08 DIAGNOSIS — E0590 Thyrotoxicosis, unspecified without thyrotoxic crisis or storm: Secondary | ICD-10-CM | POA: Diagnosis not present

## 2022-06-08 DIAGNOSIS — I1A0 Resistant hypertension: Secondary | ICD-10-CM

## 2022-06-08 NOTE — Progress Notes (Signed)
    SUBJECTIVE:   CHIEF COMPLAINT / HPI:   HTN: Currently taking Carvedilol 25 BID, hydralazine 75mg  TID, Valsartan 80 mg  Feels much improved from previous visit.  He notes that most of his symptoms have dissipated at this time.  He is compliant with his medications but stopped amlodipine 10 mg recently. Has visit with cardiology 06/16/22  Patient reports that he stopped taking his statin as well.  Patient denies any symptoms of hyper or hypothyroidism.   PERTINENT  PMH / PSH: History of hemorrhagic stroke, hypertension, history of TBI  OBJECTIVE:   BP 131/83   Pulse 90   Ht 6\' 3"  (1.905 m)   Wt 243 lb (110.2 kg)   SpO2 100%   BMI 30.37 kg/m   General: Alert and oriented in no apparent distress HEENT: Thyroid nodule present on the right lobe, nontender Heart: Regular rate and rhythm with no murmurs appreciated Lungs: CTA bilaterally, no wheezing Abdomen: Bowel sounds present, no abdominal pain Skin: Warm and dry   ASSESSMENT/PLAN:   Hyperthyroidism Assessment & Plan: Will continue with thyroid uptake imaging scan for further assessment.  Patient is asymptomatic today.  Orders: -     NM THYROID MULT UPTAKE W/IMAGING; Future  Resistant hypertension Assessment & Plan: As patient is very compliant and takes good care of his health, we discussed that he can discontinue the amlodipine 10 mg for now.  Monitor his symptoms and blood pressures daily.  Continue with carvedilol 25 twice daily, hydralazine 75 mg 3 times daily, and valsartan 80 mg.  We discussed the need for a statin as he has a history of hemorrhagic stroke.  LDL at goal.  Patient reports that he has altered his eating habits greatly since his hospital admission and would like to not take the statin.  He can discuss this further with his cardiologist.      Erskine Emery, MD [place]

## 2022-06-08 NOTE — Patient Instructions (Addendum)
It was great to see you today! Thank you for choosing [facility] for your primary care. Paul [name] was seen for follow up.  Today we addressed: Continuing with Carvedilol 25 twice daily, Hydralazine 75mg  TID, Valsartan 80 mg Please start taking your statin again  I will check your labs and ordered imaging  You need to monitor your pressures and restart amlodipine if they become high   If you haven't already, sign up for My Chart to have easy access to your labs results, and communication with your primary care physician.  We are checking some labs today. If they are abnormal, I will call you. If they are normal, I will send you a MyChart message (if it is active) or a letter in the mail. If you do not hear about your labs in the next 2 weeks, please call the office. I recommend that you always bring your medications to each appointment as this makes it easy to ensure you are on the correct medications and helps [place] not miss refills when you need them. Call the clinic at [phone] if your symptoms worsen or you have any concerns.  You should return to our clinic Return in about 4 weeks (around 07/06/2022) for HTN, thyroid . Please arrive 15 minutes before your appointment to ensure smooth check in process.  We appreciate your efforts in making this happen.  Thank you for allowing me to participate in your care, Erskine Emery, MD 06/08/2022, 11:34 AM PGY-2, [place]

## 2022-06-09 NOTE — Assessment & Plan Note (Signed)
Will continue with thyroid uptake imaging scan for further assessment.  Patient is asymptomatic today.

## 2022-06-09 NOTE — Assessment & Plan Note (Signed)
As patient is very compliant and takes good care of his health, we discussed that he can discontinue the amlodipine 10 mg for now.  Monitor his symptoms and blood pressures daily.  Continue with carvedilol 25 twice daily, hydralazine 75 mg 3 times daily, and valsartan 80 mg.  We discussed the need for a statin as he has a history of hemorrhagic stroke.  LDL at goal.  Patient reports that he has altered his eating habits greatly since his hospital admission and would like to not take the statin.  He can discuss this further with his cardiologist.

## 2022-06-13 ENCOUNTER — Other Ambulatory Visit: Payer: Self-pay

## 2022-06-14 ENCOUNTER — Encounter (HOSPITAL_COMMUNITY): Admission: RE | Admit: 2022-06-14 | Payer: Medicaid Other | Source: Ambulatory Visit

## 2022-06-14 ENCOUNTER — Encounter (HOSPITAL_COMMUNITY): Payer: Medicaid Other

## 2022-06-15 ENCOUNTER — Ambulatory Visit (HOSPITAL_COMMUNITY): Payer: Medicaid Other

## 2022-06-16 ENCOUNTER — Encounter (HOSPITAL_BASED_OUTPATIENT_CLINIC_OR_DEPARTMENT_OTHER): Payer: Self-pay | Admitting: Cardiovascular Disease

## 2022-06-16 ENCOUNTER — Ambulatory Visit (INDEPENDENT_AMBULATORY_CARE_PROVIDER_SITE_OTHER): Payer: Medicaid Other | Admitting: Cardiovascular Disease

## 2022-06-16 ENCOUNTER — Other Ambulatory Visit: Payer: Self-pay

## 2022-06-16 DIAGNOSIS — I1A0 Resistant hypertension: Secondary | ICD-10-CM | POA: Diagnosis not present

## 2022-06-16 DIAGNOSIS — I422 Other hypertrophic cardiomyopathy: Secondary | ICD-10-CM | POA: Diagnosis not present

## 2022-06-16 HISTORY — DX: Other hypertrophic cardiomyopathy: I42.2

## 2022-06-16 MED ORDER — AMLODIPINE BESYLATE 2.5 MG PO TABS
2.5000 mg | ORAL_TABLET | Freq: Every day | ORAL | 3 refills | Status: DC
  Filled 2022-06-16: qty 90, 90d supply, fill #0
  Filled 2022-08-01: qty 90, 90d supply, fill #1

## 2022-06-16 NOTE — Assessment & Plan Note (Signed)
BP at home has been in the 120s-130s/70-80.  He stopped HCTZ and spironolactone because they were causing constipation.  He has not been taking his amlodipine lately.  He notes that his primary care told him to only take this if his blood pressure is over 150.  His pressure in the office today is very high but he thinks that is because he was rushing for his appointment.  At home it has been closer to control.  Ideally he needs to be less than 130/80.  Add amlodipine 2.5 mg daily.  Continue carvedilol, hydralazine, and valsartan.

## 2022-06-16 NOTE — Assessment & Plan Note (Signed)
Noted on cardiac MRI.  Asymptomatic.  Septal thickness was 2.1 cm.  He had very minimal late gadolinium enhancement.  No family history of sudden cardiac death.  No indication for ICD.  Continue to monitor.  Blood pressure control as above.

## 2022-06-16 NOTE — Patient Instructions (Signed)
Medication Instructions:  DECREASE YOUR AMLODIPINE TO 2.5 MG DAILY   *If you need a refill on your cardiac medications before your next appointment, please call your pharmacy*  Lab Work: NONE  Testing/Procedures: NONE  Follow-Up: At [facility], you and your health needs are our priority.  As part of our continuing mission to provide you with exceptional heart care, we have created designated Provider Care Teams.  These Care Teams include your primary Cardiologist (physician) and Advanced Practice Providers (APPs -  Physician Assistants and Nurse Practitioners) who all work together to provide you with the care you need, when you need it.  We recommend signing up for the patient portal called "MyChart".  Sign up information is provided on this After Visit Summary.  MyChart is used to connect with patients for Virtual Visits (Telemedicine).  Patients are able to view lab/test results, encounter notes, upcoming appointments, etc.  Non-urgent messages can be sent to your provider as well.   To learn more about what you can do with MyChart, go to [email or web address].    Your next appointment:   6 month(s)  The format for your next appointment:   In Person  Provider:   Skeet Latch, MD    PHARM D 1 MONTH  Other Instructions MONITOR AND LOG YOUR BLOOD PRESSURE DAILY. BRING READINGS AND MACHINE TO FOLLOW UP
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
## 2022-06-20 ENCOUNTER — Encounter (HOSPITAL_COMMUNITY)
Admission: RE | Admit: 2022-06-20 | Discharge: 2022-06-20 | Disposition: A | Discharge status: Home / Self Care | Payer: Medicaid Other | Source: Ambulatory Visit | Attending: Family Medicine | Admitting: Family Medicine

## 2022-06-20 ENCOUNTER — Other Ambulatory Visit: Payer: Self-pay | Admitting: Student

## 2022-06-20 DIAGNOSIS — E0590 Thyrotoxicosis, unspecified without thyrotoxic crisis or storm: Secondary | ICD-10-CM | POA: Insufficient documentation

## 2022-06-20 MED ORDER — SODIUM IODIDE I-123 7.4 MBQ CAPS
403.0000 | ORAL_CAPSULE | Freq: Once | ORAL | Status: AC
  Administered 2022-06-20: 403 via ORAL

## 2022-06-21 ENCOUNTER — Other Ambulatory Visit: Payer: Self-pay

## 2022-06-21 ENCOUNTER — Encounter (HOSPITAL_COMMUNITY)
Admission: RE | Admit: 2022-06-21 | Discharge: 2022-06-21 | Disposition: A | Discharge status: Home / Self Care | Payer: Medicaid Other | Source: Ambulatory Visit | Attending: Family Medicine | Admitting: Family Medicine

## 2022-06-21 DIAGNOSIS — E0590 Thyrotoxicosis, unspecified without thyrotoxic crisis or storm: Secondary | ICD-10-CM | POA: Diagnosis not present

## 2022-06-21 MED ORDER — CARVEDILOL 25 MG PO TABS
25.0000 mg | ORAL_TABLET | Freq: Two times a day (BID) | ORAL | 1 refills | Status: DC
  Filled 2022-06-21: qty 60, 30d supply, fill #0
  Filled 2022-07-20: qty 60, 30d supply, fill #1

## 2022-06-21 MED ORDER — HYDRALAZINE HCL 25 MG PO TABS
75.0000 mg | ORAL_TABLET | Freq: Three times a day (TID) | ORAL | 2 refills | Status: DC
  Filled 2022-06-21: qty 180, 20d supply, fill #0
  Filled 2022-07-14: qty 180, 20d supply, fill #1
  Filled 2022-08-01: qty 180, 20d supply, fill #2

## 2022-06-22 ENCOUNTER — Other Ambulatory Visit: Payer: Self-pay | Admitting: Student

## 2022-06-22 DIAGNOSIS — Z419 Encounter for procedure for purposes other than remedying health state, unspecified: Secondary | ICD-10-CM | POA: Diagnosis not present

## 2022-06-22 DIAGNOSIS — E041 Nontoxic single thyroid nodule: Secondary | ICD-10-CM

## 2022-06-27 ENCOUNTER — Ambulatory Visit
Admission: RE | Admit: 2022-06-27 | Discharge: 2022-06-27 | Disposition: A | Discharge status: Home / Self Care | Payer: Medicaid Other | Source: Ambulatory Visit | Attending: Family Medicine | Admitting: Family Medicine

## 2022-06-27 DIAGNOSIS — E042 Nontoxic multinodular goiter: Secondary | ICD-10-CM | POA: Diagnosis not present

## 2022-06-27 DIAGNOSIS — E041 Nontoxic single thyroid nodule: Secondary | ICD-10-CM

## 2022-06-28 ENCOUNTER — Other Ambulatory Visit: Payer: Self-pay | Admitting: Student

## 2022-06-28 DIAGNOSIS — E0590 Thyrotoxicosis, unspecified without thyrotoxic crisis or storm: Secondary | ICD-10-CM

## 2022-06-28 NOTE — Progress Notes (Signed)
Endo referral sent.

## 2022-07-05 ENCOUNTER — Encounter: Payer: Self-pay | Admitting: Student

## 2022-07-11 ENCOUNTER — Encounter: Payer: Self-pay | Admitting: Student

## 2022-07-11 ENCOUNTER — Ambulatory Visit (INDEPENDENT_AMBULATORY_CARE_PROVIDER_SITE_OTHER): Payer: Medicaid Other | Admitting: Student

## 2022-07-11 VITALS — BP 128/82 | HR 62 | Wt 233.6 lb

## 2022-07-11 DIAGNOSIS — E0590 Thyrotoxicosis, unspecified without thyrotoxic crisis or storm: Secondary | ICD-10-CM | POA: Diagnosis not present

## 2022-07-11 DIAGNOSIS — I1A0 Resistant hypertension: Secondary | ICD-10-CM

## 2022-07-11 NOTE — Patient Instructions (Addendum)
It was great to see you today! Thank you for choosing [facility] for your primary care. Paul [name] was seen for follow up.  [facility] [address], [place], [place] [address]   Continue with current BP medications  I would continue the Crestor  Follow up with cards and endocrinology Call if BP>130/90  If you haven't already, sign up for My Chart to have easy access to your labs results, and communication with your primary care physician.  I recommend that you always bring your medications to each appointment as this makes it easy to ensure you are on the correct medications and helps [place] not miss refills when you need them. Call the clinic at [phone] if your symptoms worsen or you have any concerns.  You should return to our clinic Return in about 2 months (around 09/10/2022) for HTN . Please arrive 15 minutes before your appointment to ensure smooth check in process.  We appreciate your efforts in making this happen.  Thank you for allowing me to participate in your care, Alfredo Martinez, MD 07/11/2022, 2:14 PM PGY-2, [facility]

## 2022-07-11 NOTE — Progress Notes (Signed)
  SUBJECTIVE:   CHIEF COMPLAINT / HPI:   HTN follow up: At last appointment with cardiology, patient was instructed to continue with carvedilol, hydralazine, valsartan, amlodipine.  Patient reports that he is taking each of these medications daily. Patient reports that he is no longer having side effects to the medications.  Patient continues to endorse an active lifestyle and appropriate diet.  Patient has yet to hear from endocrinology regarding his thyroid functioning  He is tolerating diet well without any dysphagia.  Patient has a follow-up with cardiology on 07/20/22  PERTINENT  PMH / PSH: History of hemorrhagic stroke, hypertension, history of TBI with VP shunt   OBJECTIVE:  BP 128/82   Pulse 62   Wt 233 lb 9.6 oz (106 kg)   SpO2 99%   BMI 29.20 kg/m  General: Alert and oriented in no apparent distress; pleasant AAM HEENT: Known facial drooping with no acute abnormalities, EOMI, speech difficulty-improved from previous Heart: Regular rate and rhythm with no murmurs appreciated Lungs: CTA bilaterally, no wheezing Abdomen: Bowel sounds present, no abdominal pain Skin: Warm and dry  ASSESSMENT/PLAN:  Resistant hypertension Assessment & Plan: Continue with medications noted above.  Carvedilol, hydralazine 25mg  3 times daily, valsartan 80 mg, amlodipine 2.5 daily.  Patient denies any side effects currently with this medication regimen.  Blood pressure appropriate in office today.  Continue to monitor blood pressures and let me know if side effects return.  Follow-up with cardiology at appointment on 07/20/2022.   Hyperthyroidism Assessment & Plan: TSH low, T4 elevated.  Thyroid uptake imaging scan with poor uptake noted.  Ultrasound showed no need for FNA per TIRADS criteria.  Possibly related to thyroiditis, patient is asymptomatic at this time.  Will refer to endocrinology.  Provided the phone number for [facility] endocrinology as referral has been sent.    Return in about  2 months (around 09/10/2022) for HTN . 09/12/2022, MD 07/11/2022, 3:29 PM PGY-2, [facility]

## 2022-07-11 NOTE — Assessment & Plan Note (Signed)
TSH low, T4 elevated.  Thyroid uptake imaging scan with poor uptake noted.  Ultrasound showed no need for FNA per TIRADS criteria.  Possibly related to thyroiditis, patient is asymptomatic at this time.  Will refer to endocrinology.  Provided the phone number for [facility] endocrinology as referral has been sent.

## 2022-07-11 NOTE — Assessment & Plan Note (Signed)
Continue with medications noted above.  Carvedilol, hydralazine 25mg  3 times daily, valsartan 80 mg, amlodipine 2.5 daily.  Patient denies any side effects currently with this medication regimen.  Blood pressure appropriate in office today.  Continue to monitor blood pressures and let me know if side effects return.  Follow-up with cardiology at appointment on 07/20/2022.

## 2022-07-15 ENCOUNTER — Other Ambulatory Visit: Payer: Self-pay
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
## 2022-07-20 ENCOUNTER — Ambulatory Visit (INDEPENDENT_AMBULATORY_CARE_PROVIDER_SITE_OTHER): Payer: Medicaid Other | Admitting: Student

## 2022-07-20 ENCOUNTER — Other Ambulatory Visit: Payer: Self-pay

## 2022-07-20 ENCOUNTER — Other Ambulatory Visit (HOSPITAL_BASED_OUTPATIENT_CLINIC_OR_DEPARTMENT_OTHER): Payer: Self-pay

## 2022-07-20 DIAGNOSIS — I1A0 Resistant hypertension: Secondary | ICD-10-CM | POA: Diagnosis not present

## 2022-07-20 MED ORDER — VALSARTAN 160 MG PO TABS
160.0000 mg | ORAL_TABLET | Freq: Every day | ORAL | 3 refills | Status: DC
  Filled 2022-07-20: qty 30, 30d supply, fill #0
  Filled 2022-07-28: qty 75, 75d supply, fill #0
  Filled 2022-07-28: qty 15, 15d supply, fill #0
  Filled 2022-11-05: qty 90, 90d supply, fill #1
  Filled 2023-04-01: qty 90, 90d supply, fill #2

## 2022-07-20 NOTE — Assessment & Plan Note (Addendum)
Assessment:  BP: uncontrolled - in office 157/98 heart rate 72 (goal <130/80) home BP ~150-145/90.  Takes medications out of the pill vial and reports forgetting it sometime. However, tolerates them well Denies dizziness, palpitation, syncope, swelling, SOB or headaches Home cuff validated today it is accurate  Given high BP readings, both in-office reading and home, will optimize dose of ARB Plan: Increase valsartan from 80 mg daily to 160 mg daily Continue taking amlodipine 2.5 mg daily, carvedilol 25 mg twice daily, hydralazine 75 mg three times daily Patient to start using pill box to assess adherence, check BP at home every other day and bring readings to the next visit Will repeat BMP in 2-3 weeks  Follow up with PharmD will be scheduled in 4-6 weeks

## 2022-07-20 NOTE — Patient Instructions (Addendum)
Changes made by your pharmacist Carmela Hurt, PharmD at today's visit:    Instructions/Changes  (what do you need to do) Your Notes  (what you did and when you did it)  Increase valsartan from 1 tab (80 mg) daily to 2 tablets (160 mg )daily    2. Continue mlodipine 2.5 mg daily, carvedilol 25 mg twice daily, hydralazine 75 mg three times daily   3. Increase exercise time from 5 min jogging to 10 min jogging every day and continue doing 20 pushups and 20 squats every day    Bring  your record of home blood pressures to your next appointment.    HOW TO TAKE YOUR BLOOD PRESSURE AT HOME  Rest 5 minutes before taking your blood pressure.  Don't smoke or drink caffeinated beverages for at least 30 minutes before. Take your blood pressure before (not after) you eat. Sit comfortably with your back supported and both feet on the floor (don't cross your legs). Elevate your arm to heart level on a table or a desk. Use the proper sized cuff. It should fit smoothly and snugly around your bare upper arm. There should be enough room to slip a fingertip under the cuff. The bottom edge of the cuff should be 1 inch above the crease of the elbow. Ideally, take 3 measurements at one sitting and record the average.  Important lifestyle changes to control high blood pressure  Intervention  Effect on the BP  Lose extra pounds and watch your waistline Weight loss is one of the most effective lifestyle changes for controlling blood pressure. If you're overweight or obese, losing even a small amount of weight can help reduce blood pressure. Blood pressure might go down by about 1 millimeter of mercury (mm Hg) with each kilogram (about 2.2 pounds) of weight lost.  Exercise regularly As a general goal, aim for at least 30 minutes of moderate physical activity every day. Regular physical activity can lower high blood pressure by about 5 to 8 mm Hg.  Eat a healthy diet Eating a diet rich in whole grains, fruits,  vegetables, and low-fat dairy products and low in saturated fat and cholesterol. A healthy diet can lower high blood pressure by up to 11 mm Hg.  Reduce salt (sodium) in your diet Even a small reduction of sodium in the diet can improve heart health and reduce high blood pressure by about 5 to 6 mm Hg.  Limit alcohol One drink equals 12 ounces of beer, 5 ounces of wine, or 1.5 ounces of 80-proof liquor.  Limiting alcohol to less than one drink a day for women or two drinks a day for men can help lower blood pressure by about 4 mm Hg.   If you have any questions or concerns please use My Chart to send questions or call the office at [phone]

## 2022-07-22 DIAGNOSIS — Z419 Encounter for procedure for purposes other than remedying health state, unspecified: Secondary | ICD-10-CM | POA: Diagnosis not present

## 2022-07-27 ENCOUNTER — Other Ambulatory Visit: Payer: Self-pay

## 2022-07-28 ENCOUNTER — Other Ambulatory Visit (HOSPITAL_BASED_OUTPATIENT_CLINIC_OR_DEPARTMENT_OTHER): Payer: Self-pay

## 2022-08-01 ENCOUNTER — Other Ambulatory Visit (HOSPITAL_BASED_OUTPATIENT_CLINIC_OR_DEPARTMENT_OTHER): Payer: Self-pay

## 2022-08-01 ENCOUNTER — Other Ambulatory Visit: Payer: Self-pay

## 2022-08-03 ENCOUNTER — Telehealth (HOSPITAL_BASED_OUTPATIENT_CLINIC_OR_DEPARTMENT_OTHER): Payer: Self-pay | Admitting: Cardiovascular Disease

## 2022-08-03 ENCOUNTER — Other Ambulatory Visit (HOSPITAL_BASED_OUTPATIENT_CLINIC_OR_DEPARTMENT_OTHER): Payer: Self-pay

## 2022-08-03 NOTE — Telephone Encounter (Signed)
Spoke with patient regarding the upcoming Pharm D appointment on Friday 09/02/22 at 8:30 am here at [facility].  Also reminded patient to have his lab work done prior to his visit.  Patient stated the appointment information will be in My Chart.Paul [name]

## 2022-08-06 ENCOUNTER — Other Ambulatory Visit (HOSPITAL_BASED_OUTPATIENT_CLINIC_OR_DEPARTMENT_OTHER): Payer: Self-pay

## 2022-08-08 ENCOUNTER — Other Ambulatory Visit (HOSPITAL_BASED_OUTPATIENT_CLINIC_OR_DEPARTMENT_OTHER): Payer: Self-pay

## 2022-08-08 ENCOUNTER — Encounter: Payer: Self-pay | Admitting: Student

## 2022-08-08 ENCOUNTER — Telehealth: Payer: Self-pay | Admitting: Student

## 2022-08-08 DIAGNOSIS — I1A0 Resistant hypertension: Secondary | ICD-10-CM

## 2022-08-08 NOTE — Telephone Encounter (Signed)
Patient is calling to schedule lab work. He said he was asked to have lab work done before his next appointment. There are no lab orders placed. Please place orders and let someone know to call patient to schedule when they have been placed.

## 2022-08-22 DIAGNOSIS — Z419 Encounter for procedure for purposes other than remedying health state, unspecified: Secondary | ICD-10-CM | POA: Diagnosis not present

## 2022-08-29 ENCOUNTER — Other Ambulatory Visit (HOSPITAL_BASED_OUTPATIENT_CLINIC_OR_DEPARTMENT_OTHER): Payer: Self-pay

## 2022-08-29 ENCOUNTER — Other Ambulatory Visit: Payer: Self-pay | Admitting: Student

## 2022-08-29 MED ORDER — CARVEDILOL 25 MG PO TABS
25.0000 mg | ORAL_TABLET | Freq: Two times a day (BID) | ORAL | 1 refills | Status: DC
  Filled 2022-08-29: qty 60, 30d supply, fill #0
  Filled 2022-10-10: qty 60, 30d supply, fill #1

## 2022-08-29 MED ORDER — HYDRALAZINE HCL 25 MG PO TABS
75.0000 mg | ORAL_TABLET | Freq: Three times a day (TID) | ORAL | 2 refills | Status: DC
  Filled 2022-08-29: qty 180, 20d supply, fill #0
  Filled 2022-09-19 – 2022-09-20 (×2): qty 180, 20d supply, fill #1
  Filled 2022-10-10: qty 180, 20d supply, fill #2

## 2022-09-02 ENCOUNTER — Other Ambulatory Visit (HOSPITAL_BASED_OUTPATIENT_CLINIC_OR_DEPARTMENT_OTHER): Payer: Self-pay

## 2022-09-02 ENCOUNTER — Ambulatory Visit (INDEPENDENT_AMBULATORY_CARE_PROVIDER_SITE_OTHER): Payer: Medicaid Other | Admitting: Pharmacist Clinician (PhC)/ Clinical Pharmacy Specialist

## 2022-09-02 VITALS — BP 143/93 | Ht 75.0 in | Wt 229.3 lb

## 2022-09-02 DIAGNOSIS — I1A0 Resistant hypertension: Secondary | ICD-10-CM | POA: Diagnosis not present

## 2022-09-02 MED ORDER — AMLODIPINE BESYLATE 5 MG PO TABS
5.0000 mg | ORAL_TABLET | Freq: Every day | ORAL | 3 refills | Status: DC
  Filled 2022-09-02 – 2022-09-20 (×2): qty 90, 90d supply, fill #0
  Filled 2023-01-05: qty 90, 90d supply, fill #1
  Filled 2023-04-01: qty 90, 90d supply, fill #2

## 2022-09-02 NOTE — Patient Instructions (Signed)
Follow up appointment: in March - we will reach out to you in a few weeks to get that scheduled  Go to the lab next week with Dr. Zigmund Daniel appt - need to check kidney function  Take your BP meds as follows:  Increase amlodipine to 5 mg once daily  Continue with all other medications  Check your blood pressure at home daily (if able) and keep record of the readings.  Hypertension "High blood pressure"  Hypertension is often called "The Silent Killer." It rarely causes symptoms until it is extremely  high or has done damage to other organs in the body. For this reason, you should have your  blood pressure checked regularly by your physician. We will check your blood pressure  every time you see a provider at one of our offices.   Your blood pressure reading consists of two numbers. Ideally, blood pressure should be  below 120/80. The first ("top") number is called the systolic pressure. It measures the  pressure in your arteries as your heart beats. The second ("bottom") number is called the diastolic pressure. It measures the pressure in your arteries as the heart relaxes between beats.  The benefits of getting your blood pressure under control are enormous. A 10-point  reduction in systolic blood pressure can reduce your risk of stroke by 27% and heart failure by 28%  Your blood pressure goal is <130/80  To check your pressure at home you will need to:  1. Sit up in a chair, with feet flat on the floor and back supported. Do not cross your ankles or legs. 2. Rest your left arm so that the cuff is about heart level. If the cuff goes on your upper arm,  then just relax the arm on the table, arm of the chair or your lap. If you have a wrist cuff, we  suggest relaxing your wrist against your chest (think of it as Pledging the Flag with the  wrong arm).  3. Place the cuff snugly around your arm, about 1 inch above the crook of your elbow. The  cords should be inside the groove of your  elbow.  4. Sit quietly, with the cuff in place, for about 5 minutes. After that 5 minutes press the power  button to start a reading. 5. Do not talk or move while the reading is taking place.  6. Record your readings on a sheet of paper. Although most cuffs have a memory, it is often  easier to see a pattern developing when the numbers are all in front of you.  7. You can repeat the reading after 1-3 minutes if it is recommended  Make sure your bladder is empty and you have not had caffeine or tobacco within the last 30 min  Always bring your blood pressure log with you to your appointments. If you have not brought your monitor in to be double checked for accuracy, please bring it to your next appointment.  You can find a list of quality blood pressure cuffs at [email or web address]
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
## 2022-09-03 ENCOUNTER — Encounter (HOSPITAL_BASED_OUTPATIENT_CLINIC_OR_DEPARTMENT_OTHER): Payer: Self-pay | Admitting: Pharmacist Clinician (PhC)/ Clinical Pharmacy Specialist

## 2022-09-03 NOTE — Assessment & Plan Note (Signed)
Assessment: BP is uncontrolled in office BP 149/101 mmHg;  above the goal (<130/80). Tolerates current medications well without any side effects Denies SOB, palpitation, chest pain, headaches,or swelling Reiterated the importance of regular exercise and low salt diet   Plan:  Increase amlodipine to 5 mg daily Continue taking all other medications Patient to keep record of BP readings with heart rate and report to [place] at the next visit Patient to follow up with PharmD in 2 months for follow up  Labs ordered today:  none

## 2022-09-06 ENCOUNTER — Ambulatory Visit (INDEPENDENT_AMBULATORY_CARE_PROVIDER_SITE_OTHER): Payer: Medicaid Other | Admitting: Student

## 2022-09-06 ENCOUNTER — Encounter: Payer: Self-pay | Admitting: Student

## 2022-09-06 VITALS — BP 145/85 | HR 61 | Ht 75.0 in | Wt 232.6 lb

## 2022-09-06 DIAGNOSIS — E0590 Thyrotoxicosis, unspecified without thyrotoxic crisis or storm: Secondary | ICD-10-CM | POA: Diagnosis not present

## 2022-09-06 DIAGNOSIS — I1A0 Resistant hypertension: Secondary | ICD-10-CM

## 2022-09-06 DIAGNOSIS — I69391 Dysphagia following cerebral infarction: Secondary | ICD-10-CM | POA: Diagnosis not present

## 2022-09-06 DIAGNOSIS — I619 Nontraumatic intracerebral hemorrhage, unspecified: Secondary | ICD-10-CM | POA: Diagnosis not present

## 2022-09-06 DIAGNOSIS — N179 Acute kidney failure, unspecified: Secondary | ICD-10-CM | POA: Diagnosis not present

## 2022-09-06 DIAGNOSIS — N189 Chronic kidney disease, unspecified: Secondary | ICD-10-CM | POA: Diagnosis not present

## 2022-09-06 DIAGNOSIS — G510 Bell's palsy: Secondary | ICD-10-CM

## 2022-09-06 NOTE — Assessment & Plan Note (Addendum)
TSH low, T4 elevated.  Thyroid uptake imaging scan with poor uptake noted.  Ultrasound showed no need for FNA per TIRADS criteria.  Paul [name] unable to see until October 2024. Will recheck thyroid function and start MM if indicated.

## 2022-09-06 NOTE — Assessment & Plan Note (Signed)
Increase amlodipine to 5 mg a day, carvedilol 25 Mg twice daily, hydralazine 75 mg 3 times daily, valsartan 160 daily.  Monitor blood pressures at home with goal of 130/80 in mind.  Continue with low-sodium diet.  Return to pharmacist at next visit in approximately 2 months.

## 2022-09-06 NOTE — Progress Notes (Signed)
  SUBJECTIVE:   CHIEF COMPLAINT / HPI:   HTN F/u: Medications: amlodipine 5 mg qd, carvedilol 25 mg bid, hydralazine 75 mg tid, valsartan 160 mg qd.  BP at home 130s/90. Reports compliance to medications. Follows with pharmacy clinic at cardiology for BP as well. Diet: weight 229>232 lb. Eating foods at home with low sodium in them. Currently Asymptomatic.  Patient reports that he is yet to increase his amlodipine to 5 mg from 2.5 mg.  Thyroid abnormalities: Referred to [facility] endocrinology, reports that they have been unable to get him in until October 2024. No symptoms of hyper or hypothyroidism.   PERTINENT  PMH / PSH: HTN, hypertrophic cardiomyopathy, ICH history, TBI hx, IPH + VP shunt placement  OBJECTIVE:  BP (!) 145/85   Pulse 61   Ht 6\' 3"  (1.905 m)   Wt 232 lb 9.6 oz (105.5 kg)   SpO2 100%   BMI 29.07 kg/m  Physical Exam  General: Alert and oriented in no apparent distress HEENT: No obvious goiter or deformity of the throat, facial palsy Heart: Regular rate and rhythm with no murmurs appreciated Lungs: CTA bilaterally, no wheezing Abdomen: Bowel sounds present, no abdominal pain Skin: Warm and dry Extremities: No lower extremity edema   ASSESSMENT/PLAN:  Facial palsy  Nontraumatic acute hemorrhage of basal ganglia (HCC)  Resistant hypertension Assessment & Plan: Increase amlodipine to 5 mg a day, carvedilol 25 Mg twice daily, hydralazine 75 mg 3 times daily, valsartan 160 daily.  Monitor blood pressures at home with goal of 130/80 in mind.  Continue with low-sodium diet.  Return to pharmacist at next visit in approximately 2 months.  Orders: -     Thyroid Function Panel (THS+T3+T4+Free) -     Basic metabolic panel  Dysphagia due to recent stroke  Acute renal failure superimposed on chronic kidney disease, unspecified acute renal failure type, unspecified CKD stage (HCC) -     Basic metabolic panel  Hyperthyroidism Assessment & Plan: TSH low, T4 elevated.   Thyroid uptake imaging scan with poor uptake noted.  Ultrasound showed no need for FNA per TIRADS criteria.  [place] unable to see until October 2024. Will recheck thyroid function and start MM if indicated.      Return in about 3 months (around 12/06/2022). Erskine Emery, MD 09/07/2022, 3:17 PM PGY-2, [place]

## 2022-09-06 NOTE — Patient Instructions (Addendum)
It was great to see you today! Thank you for choosing [facility] for your primary care. Paul [name] was seen for follow up.  Today we addressed: Continue with medications as discussed Continue with your visits to pharmacy clinic for BP  Check routine pressures at home  I will call with results or message  I will try to contact our coordinator about another endocrinologist   If you haven't already, sign up for My Chart to have easy access to your labs results, and communication with your primary care physician.  We are checking some labs today. If they are abnormal, I will call you. If they are normal, I will send you a MyChart message (if it is active) or a letter in the mail. If you do not hear about your labs in the next 2 weeks, please call the office. I recommend that you always bring your medications to each appointment as this makes it easy to ensure you are on the correct medications and helps [place] not miss refills when you need them. Call the clinic at [phone] if your symptoms worsen or you have any concerns.  You should return to our clinic Return in about 3 months (around 12/06/2022). Please arrive 15 minutes before your appointment to ensure smooth check in process.  We appreciate your efforts in making this happen.  Thank you for allowing me to participate in your care, Erskine Emery, MD 09/06/2022, 1:51 PM PGY-2, [place]

## 2022-09-12 ENCOUNTER — Other Ambulatory Visit (HOSPITAL_BASED_OUTPATIENT_CLINIC_OR_DEPARTMENT_OTHER): Payer: Self-pay

## 2022-09-16 LAB — BASIC METABOLIC PANEL

## 2022-09-16 LAB — TSH+T3+FREE T4+T3 FREE
Free T-3: 2.6 pg/mL
Free T4 by Dialysis: 0.99 ng/dL
TSH: 0.18 uU/mL — ABNORMAL LOW
Triiodothyronine (T-3), Serum: 77 ng/dL

## 2022-09-20 ENCOUNTER — Other Ambulatory Visit (HOSPITAL_BASED_OUTPATIENT_CLINIC_OR_DEPARTMENT_OTHER): Payer: Self-pay

## 2022-09-20 ENCOUNTER — Other Ambulatory Visit: Payer: Self-pay

## 2022-09-22 DIAGNOSIS — Z419 Encounter for procedure for purposes other than remedying health state, unspecified: Secondary | ICD-10-CM | POA: Diagnosis not present

## 2022-10-21 DIAGNOSIS — Z419 Encounter for procedure for purposes other than remedying health state, unspecified: Secondary | ICD-10-CM | POA: Diagnosis not present
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
## 2022-10-24 ENCOUNTER — Ambulatory Visit (INDEPENDENT_AMBULATORY_CARE_PROVIDER_SITE_OTHER): Payer: Medicaid Other | Admitting: Pharmacist Clinician (PhC)/ Clinical Pharmacy Specialist

## 2022-10-24 ENCOUNTER — Encounter (HOSPITAL_BASED_OUTPATIENT_CLINIC_OR_DEPARTMENT_OTHER): Payer: Self-pay | Admitting: Pharmacist Clinician (PhC)/ Clinical Pharmacy Specialist

## 2022-10-24 ENCOUNTER — Other Ambulatory Visit (HOSPITAL_BASED_OUTPATIENT_CLINIC_OR_DEPARTMENT_OTHER): Payer: Self-pay

## 2022-10-24 VITALS — BP 139/85 | HR 60 | Ht 75.0 in | Wt 231.7 lb

## 2022-10-24 DIAGNOSIS — I1A0 Resistant hypertension: Secondary | ICD-10-CM | POA: Diagnosis not present

## 2022-10-24 MED ORDER — HYDRALAZINE HCL 25 MG PO TABS
75.0000 mg | ORAL_TABLET | Freq: Three times a day (TID) | ORAL | 2 refills | Status: DC
  Filled 2022-10-24: qty 540, 60d supply, fill #0
  Filled 2022-11-05: qty 513, 57d supply, fill #0
  Filled 2022-11-05: qty 27, 3d supply, fill #0

## 2022-10-24 NOTE — Assessment & Plan Note (Signed)
Assessment: BP is uncontrolled in office BP 139/85 mmHg;  above the goal (<130/80). Home readings have been consistently in the Q000111Q systolic Tolerates current medications well without any side effects  Denies SOB, palpitation, chest pain, headaches,or swelling Reiterated the importance of regular exercise and low salt diet   Plan:  Continue taking current medications Patient to keep record of BP readings with heart rate and report to [place] at the next visit Patient to follow up with Dr. Oval Linsey in 2-3 months  Labs ordered today: none

## 2022-10-24 NOTE — Patient Instructions (Signed)
Follow up appointment: make appointment with Dr. Oval Linsey for May 2024   Take your BP meds as follows: continue with your current medications  Check your blood pressure at home daily and keep record of the readings.  Hypertension "High blood pressure"  Hypertension is often called "The Silent Killer." It rarely causes symptoms until it is extremely  high or has done damage to other organs in the body. For this reason, you should have your  blood pressure checked regularly by your physician. We will check your blood pressure  every time you see a provider at one of our offices.   Your blood pressure reading consists of two numbers. Ideally, blood pressure should be  below 120/80. The first ("top") number is called the systolic pressure. It measures the  pressure in your arteries as your heart beats. The second ("bottom") number is called the diastolic pressure. It measures the pressure in your arteries as the heart relaxes between beats.  The benefits of getting your blood pressure under control are enormous. A 10-point  reduction in systolic blood pressure can reduce your risk of stroke by 27% and heart failure by 28%  Your blood pressure goal is <130/80  To check your pressure at home you will need to:  1. Sit up in a chair, with feet flat on the floor and back supported. Do not cross your ankles or legs. 2. Rest your left arm so that the cuff is about heart level. If the cuff goes on your upper arm,  then just relax the arm on the table, arm of the chair or your lap. If you have a wrist cuff, we  suggest relaxing your wrist against your chest (think of it as Pledging the Flag with the  wrong arm).  3. Place the cuff snugly around your arm, about 1 inch above the crook of your elbow. The  cords should be inside the groove of your elbow.  4. Sit quietly, with the cuff in place, for about 5 minutes. After that 5 minutes press the power  button to start a reading. 5. Do not talk  or move while the reading is taking place.  6. Record your readings on a sheet of paper. Although most cuffs have a memory, it is often  easier to see a pattern developing when the numbers are all in front of you.  7. You can repeat the reading after 1-3 minutes if it is recommended  Make sure your bladder is empty and you have not had caffeine or tobacco within the last 30 min  Always bring your blood pressure log with you to your appointments. If you have not brought your monitor in to be double checked for accuracy, please bring it to your next appointment.  You can find a list of quality blood pressure cuffs at [email or web address]

## 2022-11-01 ENCOUNTER — Other Ambulatory Visit (HOSPITAL_BASED_OUTPATIENT_CLINIC_OR_DEPARTMENT_OTHER): Payer: Self-pay

## 2022-11-05 ENCOUNTER — Other Ambulatory Visit (HOSPITAL_BASED_OUTPATIENT_CLINIC_OR_DEPARTMENT_OTHER): Payer: Self-pay

## 2022-11-07 ENCOUNTER — Other Ambulatory Visit: Payer: Self-pay

## 2022-11-21 DIAGNOSIS — Z419 Encounter for procedure for purposes other than remedying health state, unspecified: Secondary | ICD-10-CM | POA: Diagnosis not present

## 2022-12-01 ENCOUNTER — Other Ambulatory Visit: Payer: Self-pay | Admitting: Student

## 2022-12-01 ENCOUNTER — Other Ambulatory Visit (HOSPITAL_BASED_OUTPATIENT_CLINIC_OR_DEPARTMENT_OTHER): Payer: Self-pay

## 2022-12-01 MED ORDER — CARVEDILOL 25 MG PO TABS
25.0000 mg | ORAL_TABLET | Freq: Two times a day (BID) | ORAL | 1 refills | Status: DC
  Filled 2022-12-01: qty 60, 30d supply, fill #0
  Filled 2023-01-05: qty 60, 30d supply, fill #1

## 2022-12-21 DIAGNOSIS — Z419 Encounter for procedure for purposes other than remedying health state, unspecified: Secondary | ICD-10-CM | POA: Diagnosis not present

## 2023-01-02 ENCOUNTER — Other Ambulatory Visit (HOSPITAL_BASED_OUTPATIENT_CLINIC_OR_DEPARTMENT_OTHER): Payer: Self-pay

## 2023-01-02 ENCOUNTER — Ambulatory Visit (INDEPENDENT_AMBULATORY_CARE_PROVIDER_SITE_OTHER): Payer: Medicaid Other | Admitting: Cardiovascular Disease

## 2023-01-02 ENCOUNTER — Encounter (HOSPITAL_BASED_OUTPATIENT_CLINIC_OR_DEPARTMENT_OTHER): Payer: Self-pay | Admitting: Cardiovascular Disease

## 2023-01-02 VITALS — BP 134/84 | HR 57 | Ht 75.0 in | Wt 225.2 lb

## 2023-01-02 DIAGNOSIS — I422 Other hypertrophic cardiomyopathy: Secondary | ICD-10-CM

## 2023-01-02 DIAGNOSIS — I1A0 Resistant hypertension: Secondary | ICD-10-CM

## 2023-01-02 DIAGNOSIS — Z006 Encounter for examination for normal comparison and control in clinical research program: Secondary | ICD-10-CM

## 2023-01-02 MED ORDER — HYDRALAZINE HCL 100 MG PO TABS
100.0000 mg | ORAL_TABLET | Freq: Three times a day (TID) | ORAL | 3 refills | Status: DC
  Filled 2023-01-02: qty 270, 90d supply, fill #0
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
## 2023-01-02 NOTE — Assessment & Plan Note (Signed)
He has known hypertrophic cardiomyopathy.  There is no evidence of amyloidosis on MRI it was thought to be more consistent with HCM.  Repeat echocardiogram.  He is not having any heart failure symptoms.  If we are seeing progression or a gradient consider referral to Dr.  Erlinda Hong.

## 2023-01-02 NOTE — Assessment & Plan Note (Signed)
Blood pressure once again uncontrolled.  He has not had any issues with hypotension.  Will increase hydralazine to 100 mg 3 times daily.  Continue amlodipine, carvedilol, and valsartan.  He is interested in our remote patient monitoring system and consents to monitoring in the [employer] remote patient monitoring system.  He would also be a possible candidate for renal denervation.

## 2023-01-02 NOTE — Patient Instructions (Addendum)
Medication Instructions:  INCREASE HYDRALAZINE TO 100 MG THREE TIMES A DAY   Labwork: NONE  Testing/Procedures: Your physician has requested that you have an echocardiogram. Echocardiography is a painless test that uses sound waves to create images of your heart. It provides your doctor with information about the size and shape of your heart and how well your heart's chambers and valves are working. This procedure takes approximately one hour. There are no restrictions for this procedure. Please do NOT wear cologne, perfume, aftershave, or lotions (deodorant is allowed). Please arrive 15 minutes prior to your appointment time.  Follow-Up: 03/15/2023 AT 8l30 AM WITH PHARM D   9/10/202024 AT 9:15 AM WITH DR [facility]   Any Other Special Instructions Will Be Listed Below (If Applicable). MONITOR YOUR BLOOD PRESSURE TWICE A DAY  MAKE SURE YOU ARE LOGGED INTO THE VIV[employer] APP

## 2023-01-02 NOTE — Research (Signed)
  Subject Name: Paul [name] met inclusion and exclusion criteria for the Virtual Care and Social Determinant Interventions for the management of hypertension trial.  The informed consent form, study requirements and expectations were reviewed with the subject by Dr. Duke Salvia and myself. The subject was given the opportunity to read the consent and ask questions. The subject verbalized understanding of the trial requirements.  All questions were addressed prior to the signing of the consent form. The subject agreed to participate in the trial and signed the informed consent. The informed consent was obtained prior to performance of any protocol-specific procedures for the subject.  A copy of the signed informed consent was given to the subject and a copy was placed in the subject's medical record.  Paul [name] was randomized to Group 2.

## 2023-01-03 ENCOUNTER — Telehealth: Payer: Self-pay

## 2023-01-03 DIAGNOSIS — Z0000 Encounter for general adult medical examination without abnormal findings: Secondary | ICD-10-CM

## 2023-01-03 NOTE — Telephone Encounter (Signed)
Called patient to conduct Welcome Call. Patient wanted to know the times that he should check his blood pressure. Discussed with patient the prompt times and the protocol when checking his blood pressure. Patient did not request a change in his prompt times. Patient was sent a message via the chat feature with my contact information in case he has additional questions. Patient is aware of additional contact numbers in case he has questions after hours or about the study.   Renaee Munda, MS, ERHD, [facility]  Care Guide, Health & Wellness Coach [address] [place] [address] Telephone: [phone] Email: [email or web address]

## 2023-01-05 ENCOUNTER — Encounter (HOSPITAL_BASED_OUTPATIENT_CLINIC_OR_DEPARTMENT_OTHER): Payer: Self-pay | Admitting: Cardiovascular Disease

## 2023-01-09 ENCOUNTER — Other Ambulatory Visit (HOSPITAL_BASED_OUTPATIENT_CLINIC_OR_DEPARTMENT_OTHER): Payer: Self-pay

## 2023-01-18 ENCOUNTER — Other Ambulatory Visit (HOSPITAL_BASED_OUTPATIENT_CLINIC_OR_DEPARTMENT_OTHER): Payer: Medicaid Other

## 2023-01-20 NOTE — Telephone Encounter (Signed)
Patient following up with BP since last visit

## 2023-01-21 DIAGNOSIS — Z419 Encounter for procedure for purposes other than remedying health state, unspecified: Secondary | ICD-10-CM | POA: Diagnosis not present

## 2023-01-22 NOTE — Telephone Encounter (Signed)
Average BP over last 10 days in vivify 123/80. Okay to refill hydralazine 75mg  TID (take three 25mg  tablets TID). Will need refill.   Alver Sorrow, NP

## 2023-01-23 ENCOUNTER — Other Ambulatory Visit (HOSPITAL_BASED_OUTPATIENT_CLINIC_OR_DEPARTMENT_OTHER): Payer: Self-pay

## 2023-01-23 MED ORDER — HYDRALAZINE HCL 25 MG PO TABS
75.0000 mg | ORAL_TABLET | Freq: Three times a day (TID) | ORAL | 11 refills | Status: DC
  Filled 2023-01-23: qty 270, 30d supply, fill #0
  Filled 2023-02-28 – 2023-03-01 (×2): qty 270, 30d supply, fill #1
  Filled 2023-04-01: qty 270, 30d supply, fill #2
  Filled 2023-05-06 (×2): qty 270, 30d supply, fill #3

## 2023-02-07 ENCOUNTER — Other Ambulatory Visit: Payer: Self-pay | Admitting: Student

## 2023-02-08 MED ORDER — CARVEDILOL 25 MG PO TABS
25.0000 mg | ORAL_TABLET | Freq: Two times a day (BID) | ORAL | 1 refills | Status: DC
  Filled 2023-02-08: qty 60, 30d supply, fill #0
  Filled 2023-03-06: qty 60, 30d supply, fill #1

## 2023-02-09 ENCOUNTER — Other Ambulatory Visit (HOSPITAL_BASED_OUTPATIENT_CLINIC_OR_DEPARTMENT_OTHER): Payer: Self-pay

## 2023-02-16 ENCOUNTER — Ambulatory Visit (INDEPENDENT_AMBULATORY_CARE_PROVIDER_SITE_OTHER): Payer: Medicaid Other

## 2023-02-16 DIAGNOSIS — I422 Other hypertrophic cardiomyopathy: Secondary | ICD-10-CM | POA: Diagnosis not present

## 2023-02-16 DIAGNOSIS — I1A0 Resistant hypertension: Secondary | ICD-10-CM

## 2023-02-16 LAB — ECHOCARDIOGRAM COMPLETE
Area-P 1/2: 3.12 cm2
S' Lateral: 1.64 cm

## 2023-02-20 DIAGNOSIS — Z419 Encounter for procedure for purposes other than remedying health state, unspecified: Secondary | ICD-10-CM | POA: Diagnosis not present

## 2023-02-27 ENCOUNTER — Other Ambulatory Visit (HOSPITAL_BASED_OUTPATIENT_CLINIC_OR_DEPARTMENT_OTHER): Payer: Self-pay

## 2023-03-01 ENCOUNTER — Other Ambulatory Visit: Payer: Self-pay

## 2023-03-02 ENCOUNTER — Telehealth: Payer: Self-pay

## 2023-03-02 DIAGNOSIS — Z0000 Encounter for general adult medical examination without abnormal findings: Secondary | ICD-10-CM

## 2023-03-02 NOTE — Telephone Encounter (Signed)
Called patient to ensure that device was working due to missing readings. Patient stated that the prompt times works with his schedule, he just has a hard time remembering to check his blood pressure. Informed patient that we will be monitoring his readings and contact him to make sure he is able to get his readings done moving forward.    Renaee Munda, MS, ERHD, [facility]  Care Guide, Health & Wellness Coach [address] [place] [address] Telephone: [phone] Email: [email or web address]

## 2023-03-15 ENCOUNTER — Ambulatory Visit (INDEPENDENT_AMBULATORY_CARE_PROVIDER_SITE_OTHER): Payer: Medicaid Other | Admitting: Pharmacist Clinician (PhC)/ Clinical Pharmacy Specialist

## 2023-03-15 ENCOUNTER — Encounter (HOSPITAL_BASED_OUTPATIENT_CLINIC_OR_DEPARTMENT_OTHER): Payer: Self-pay | Admitting: Pharmacist Clinician (PhC)/ Clinical Pharmacy Specialist

## 2023-03-15 VITALS — BP 135/87 | HR 60

## 2023-03-15 DIAGNOSIS — I1A0 Resistant hypertension: Secondary | ICD-10-CM

## 2023-03-15 NOTE — Patient Instructions (Signed)
Follow up appointment: with Dr. Duke Salvia on Sept 10 at 9:15  Take your BP meds as follows:  Morning: valsartan, hydralazine, carvedilol  Noon: hydralazine  Evening: amlodipine, hydralazine, carvedilol  Check your blood pressure at home daily (if able) and keep record of the readings.  Hypertension "High blood pressure"  Hypertension is often called "The Silent Killer." It rarely causes symptoms until it is extremely  high or has done damage to other organs in the body. For this reason, you should have your  blood pressure checked regularly by your physician. We will check your blood pressure  every time you see a provider at one of our offices.   Your blood pressure reading consists of two numbers. Ideally, blood pressure should be  below 120/80. The first ("top") number is called the systolic pressure. It measures the  pressure in your arteries as your heart beats. The second ("bottom") number is called the diastolic pressure. It measures the pressure in your arteries as the heart relaxes between beats.  The benefits of getting your blood pressure under control are enormous. A 10-point  reduction in systolic blood pressure can reduce your risk of stroke by 27% and heart failure by 28%  Your blood pressure goal is < 130/80  To check your pressure at home you will need to:  1. Sit up in a chair, with feet flat on the floor and back supported. Do not cross your ankles or legs. 2. Rest your left arm so that the cuff is about heart level. If the cuff goes on your upper arm,  then just relax the arm on the table, arm of the chair or your lap. If you have a wrist cuff, we  suggest relaxing your wrist against your chest (think of it as Pledging the Flag with the  wrong arm).  3. Place the cuff snugly around your arm, about 1 inch above the crook of your elbow. The  cords should be inside the groove of your elbow.  4. Sit quietly, with the cuff in place, for about 5 minutes. After that  5 minutes press the power  button to start a reading. 5. Do not talk or move while the reading is taking place.  6. Record your readings on a sheet of paper. Although most cuffs have a memory, it is often  easier to see a pattern developing when the numbers are all in front of you.  7. You can repeat the reading after 1-3 minutes if it is recommended  Make sure your bladder is empty and you have not had caffeine or tobacco within the last 30 min  Always bring your blood pressure log with you to your appointments. If you have not brought your monitor in to be double checked for accuracy, please bring it to your next appointment.  You can find a list of quality blood pressure cuffs at validatebp.org

## 2023-03-15 NOTE — Assessment & Plan Note (Signed)
Assessment: BP is uncontrolled in office BP 135/87 mmHg;  above the goal (<130/80). Has missed multiple doses of valsartan, as directions say to take at bedtime, but other evening meds are with dinner Tolerates current medications well without any side effects Denies SOB, palpitation, chest pain, headaches,or swelling Reiterated the importance of regular exercise and low salt diet   Plan:  Continue taking your current medications.   Move valsartan to mornings with AM doses of hydralazine and carvedilol Patient to keep record of BP readings with heart rate and report to [place] at the next visit Patient to follow up with Dr. Duke Salvia in 2 months  Labs ordered today:  none
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
## 2023-03-23 DIAGNOSIS — Z419 Encounter for procedure for purposes other than remedying health state, unspecified: Secondary | ICD-10-CM | POA: Diagnosis not present

## 2023-03-29 ENCOUNTER — Other Ambulatory Visit (HOSPITAL_BASED_OUTPATIENT_CLINIC_OR_DEPARTMENT_OTHER): Payer: Self-pay | Admitting: *Deleted

## 2023-03-29 DIAGNOSIS — I517 Cardiomegaly: Secondary | ICD-10-CM

## 2023-04-01 ENCOUNTER — Other Ambulatory Visit: Payer: Self-pay | Admitting: Student

## 2023-04-02 ENCOUNTER — Other Ambulatory Visit: Payer: Self-pay

## 2023-04-03 ENCOUNTER — Other Ambulatory Visit (HOSPITAL_BASED_OUTPATIENT_CLINIC_OR_DEPARTMENT_OTHER): Payer: Self-pay

## 2023-04-03 MED ORDER — CARVEDILOL 25 MG PO TABS
25.0000 mg | ORAL_TABLET | Freq: Two times a day (BID) | ORAL | 2 refills | Status: DC
  Filled 2023-04-03 – 2023-04-18 (×2): qty 60, 30d supply, fill #0
  Filled 2023-06-01: qty 60, 30d supply, fill #1
  Filled 2023-07-16: qty 60, 30d supply, fill #2

## 2023-04-13 ENCOUNTER — Other Ambulatory Visit (HOSPITAL_BASED_OUTPATIENT_CLINIC_OR_DEPARTMENT_OTHER): Payer: Self-pay

## 2023-04-18 ENCOUNTER — Other Ambulatory Visit (HOSPITAL_BASED_OUTPATIENT_CLINIC_OR_DEPARTMENT_OTHER): Payer: Self-pay

## 2023-04-23 DIAGNOSIS — Z419 Encounter for procedure for purposes other than remedying health state, unspecified: Secondary | ICD-10-CM | POA: Diagnosis not present

## 2023-05-02 ENCOUNTER — Encounter (HOSPITAL_BASED_OUTPATIENT_CLINIC_OR_DEPARTMENT_OTHER): Payer: Medicaid Other | Admitting: Cardiovascular Disease

## 2023-05-06 ENCOUNTER — Other Ambulatory Visit (HOSPITAL_BASED_OUTPATIENT_CLINIC_OR_DEPARTMENT_OTHER): Payer: Self-pay

## 2023-05-16 ENCOUNTER — Ambulatory Visit (HOSPITAL_BASED_OUTPATIENT_CLINIC_OR_DEPARTMENT_OTHER): Payer: Medicaid Other | Admitting: Cardiovascular Disease

## 2023-05-16 ENCOUNTER — Other Ambulatory Visit (HOSPITAL_BASED_OUTPATIENT_CLINIC_OR_DEPARTMENT_OTHER): Payer: Self-pay

## 2023-05-16 VITALS — BP 136/92 | HR 56 | Ht 75.0 in | Wt 239.7 lb

## 2023-05-16 DIAGNOSIS — E7800 Pure hypercholesterolemia, unspecified: Secondary | ICD-10-CM | POA: Diagnosis not present

## 2023-05-16 DIAGNOSIS — I422 Other hypertrophic cardiomyopathy: Secondary | ICD-10-CM | POA: Diagnosis not present

## 2023-05-16 DIAGNOSIS — N189 Chronic kidney disease, unspecified: Secondary | ICD-10-CM

## 2023-05-16 DIAGNOSIS — I639 Cerebral infarction, unspecified: Secondary | ICD-10-CM

## 2023-05-16 DIAGNOSIS — I1A0 Resistant hypertension: Secondary | ICD-10-CM

## 2023-05-16 DIAGNOSIS — N179 Acute kidney failure, unspecified: Secondary | ICD-10-CM

## 2023-05-16 DIAGNOSIS — Z006 Encounter for examination for normal comparison and control in clinical research program: Secondary | ICD-10-CM

## 2023-05-16 MED ORDER — HYDRALAZINE HCL 100 MG PO TABS
100.0000 mg | ORAL_TABLET | Freq: Three times a day (TID) | ORAL | 11 refills | Status: DC
  Filled 2023-05-16 – 2023-06-12 (×2): qty 270, 90d supply, fill #0
  Filled 2023-10-09: qty 270, 90d supply, fill #1
  Filled 2023-12-07 – 2023-12-08 (×2): qty 270, 90d supply, fill #2
  Filled 2023-12-09: qty 30, 10d supply, fill #2
  Filled 2023-12-21: qty 90, 30d supply, fill #3
  Filled 2024-01-27: qty 90, 30d supply, fill #4
  Filled 2024-03-11: qty 90, 30d supply, fill #5
  Filled 2024-04-17: qty 90, 30d supply, fill #6
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
## 2023-05-16 NOTE — Research (Signed)
I saw pt today after Dr. Leonides Sake follow up visit. Pt is in Dr. Leonides Sake Virtual Care HTN Study. Pt filled out research survey. Pt was enrolled in Group 2. Pt has completed the Virtual Care HTN Study.
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
## 2023-05-16 NOTE — Patient Instructions (Addendum)
Medication Instructions:  INCREASE YOUR HYDRALAZINE TO 100 MG THREE TIMES A DAY   Labwork: FASTING LP/CMET SOON  Testing/Procedures: NONE  Follow-Up: 2 MONTHS IN ADV HTN CLINIC WITH CAITLIN W NP OR DR [place]   You have been referred to NUTRITIONIST  IF YOU DO NOT HEAR FROM THEM IN 2 WEEKS YOU CAN CALL THEM DIRECTLY   If you need a refill on your cardiac medications before your next appointment, please call your pharmacy.

## 2023-05-17 ENCOUNTER — Encounter (HOSPITAL_BASED_OUTPATIENT_CLINIC_OR_DEPARTMENT_OTHER): Payer: Self-pay | Admitting: Cardiovascular Disease

## 2023-05-22 DIAGNOSIS — E7800 Pure hypercholesterolemia, unspecified: Secondary | ICD-10-CM | POA: Diagnosis not present

## 2023-05-22 DIAGNOSIS — I1A0 Resistant hypertension: Secondary | ICD-10-CM | POA: Diagnosis not present

## 2023-05-23 DIAGNOSIS — Z419 Encounter for procedure for purposes other than remedying health state, unspecified: Secondary | ICD-10-CM | POA: Diagnosis not present

## 2023-05-23 LAB — COMPREHENSIVE METABOLIC PANEL
ALT: 18 [IU]/L (ref 0–44)
AST: 17 [IU]/L (ref 0–40)
Albumin: 4.6 g/dL (ref 4.1–5.1)
Alkaline Phosphatase: 61 [IU]/L (ref 44–121)
BUN/Creatinine Ratio: 10 (ref 9–20)
BUN: 14 mg/dL (ref 6–20)
Bilirubin Total: 1 mg/dL (ref 0.0–1.2)
CO2: 24 mmol/L (ref 20–29)
Calcium: 9.8 mg/dL (ref 8.7–10.2)
Chloride: 105 mmol/L (ref 96–106)
Creatinine, Ser: 1.39 mg/dL — ABNORMAL HIGH (ref 0.76–1.27)
Globulin, Total: 2.1 g/dL (ref 1.5–4.5)
Glucose: 79 mg/dL (ref 70–99)
Potassium: 5 mmol/L (ref 3.5–5.2)
Sodium: 143 mmol/L (ref 134–144)
Total Protein: 6.7 g/dL (ref 6.0–8.5)
eGFR: 67 mL/min/{1.73_m2} (ref >59)

## 2023-05-23 LAB — LIPID PANEL
Chol/HDL Ratio: 4.2 {ratio} (ref 0.0–5.0)
Cholesterol, Total: 171 mg/dL (ref 100–199)
HDL: 41 mg/dL (ref >39)
LDL Chol Calc (NIH): 114 mg/dL — ABNORMAL HIGH (ref 0–99)
Triglycerides: 85 mg/dL (ref 0–149)
VLDL Cholesterol Cal: 16 mg/dL (ref 5–40)

## 2023-05-30 ENCOUNTER — Telehealth (HOSPITAL_BASED_OUTPATIENT_CLINIC_OR_DEPARTMENT_OTHER): Payer: Self-pay

## 2023-05-30 ENCOUNTER — Other Ambulatory Visit (HOSPITAL_BASED_OUTPATIENT_CLINIC_OR_DEPARTMENT_OTHER): Payer: Self-pay

## 2023-05-30 DIAGNOSIS — E7800 Pure hypercholesterolemia, unspecified: Secondary | ICD-10-CM

## 2023-05-30 NOTE — Telephone Encounter (Addendum)
Results called to patient who verbalizes understanding!   He would like to work on lifestyle changes and recheck fasting labs in 4 months. Labs ordered and mailed to patient.    ----- Message from Alver Sorrow sent at 05/29/2023 10:10 AM EDT ----- Stable kidney function. Normal liver, electrolytes. LDL (bad cholesterol) of 114 which is not at goal <70. LDL goal <70 due to history of stroke.   Please ensure he was fasting at time of lab draw as LDL last year 68 now 114.  If not fasting, repeat FLP. If fasting, can either (1) add Rosuvastatin 5mg  daily and repeat FLP/LFT in 2 months or (2) work on lifestyle changes and repeat FLP/LFT in 4 months

## 2023-06-12 ENCOUNTER — Other Ambulatory Visit (HOSPITAL_BASED_OUTPATIENT_CLINIC_OR_DEPARTMENT_OTHER): Payer: Self-pay

## 2023-07-03 ENCOUNTER — Encounter: Payer: Medicaid Other | Attending: Cardiovascular Disease | Admitting: Dietician

## 2023-07-03 DIAGNOSIS — I639 Cerebral infarction, unspecified: Secondary | ICD-10-CM | POA: Insufficient documentation

## 2023-07-03 DIAGNOSIS — I1A0 Resistant hypertension: Secondary | ICD-10-CM | POA: Diagnosis not present

## 2023-07-03 DIAGNOSIS — N189 Chronic kidney disease, unspecified: Secondary | ICD-10-CM | POA: Insufficient documentation

## 2023-07-03 DIAGNOSIS — N179 Acute kidney failure, unspecified: Secondary | ICD-10-CM | POA: Insufficient documentation

## 2023-07-03 DIAGNOSIS — Z713 Dietary counseling and surveillance: Secondary | ICD-10-CM | POA: Diagnosis not present
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
## 2023-07-27 ENCOUNTER — Encounter (HOSPITAL_BASED_OUTPATIENT_CLINIC_OR_DEPARTMENT_OTHER): Payer: Self-pay | Admitting: Family

## 2023-07-27 ENCOUNTER — Other Ambulatory Visit (HOSPITAL_BASED_OUTPATIENT_CLINIC_OR_DEPARTMENT_OTHER): Payer: Self-pay

## 2023-07-27 ENCOUNTER — Ambulatory Visit (INDEPENDENT_AMBULATORY_CARE_PROVIDER_SITE_OTHER): Payer: Medicaid Other | Admitting: Family

## 2023-07-27 VITALS — BP 136/80 | HR 57 | Ht 75.0 in | Wt 230.0 lb

## 2023-07-27 DIAGNOSIS — I517 Cardiomegaly: Secondary | ICD-10-CM

## 2023-07-27 DIAGNOSIS — I1A0 Resistant hypertension: Secondary | ICD-10-CM | POA: Diagnosis not present

## 2023-07-27 DIAGNOSIS — E782 Mixed hyperlipidemia: Secondary | ICD-10-CM | POA: Diagnosis not present

## 2023-07-27 MED ORDER — AMLODIPINE BESYLATE 5 MG PO TABS
5.0000 mg | ORAL_TABLET | Freq: Every day | ORAL | 3 refills | Status: DC
  Filled 2023-07-27 – 2023-10-09 (×2): qty 90, 90d supply, fill #0

## 2023-07-27 NOTE — Patient Instructions (Addendum)
Medication Instructions:  Your physician has recommended you make the following change in your medication:   START Hydralazine 100 mg three times a day START Amlodipine 5 mg daily  Follow-Up: Follow up in 3 months in Advanced Hypertension Clinic
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
## 2023-08-07 ENCOUNTER — Other Ambulatory Visit (HOSPITAL_BASED_OUTPATIENT_CLINIC_OR_DEPARTMENT_OTHER): Payer: Self-pay

## 2023-08-21 ENCOUNTER — Other Ambulatory Visit (HOSPITAL_BASED_OUTPATIENT_CLINIC_OR_DEPARTMENT_OTHER): Payer: Self-pay

## 2023-08-21 ENCOUNTER — Other Ambulatory Visit: Payer: Self-pay | Admitting: Student

## 2023-08-21 MED ORDER — CARVEDILOL 25 MG PO TABS
25.0000 mg | ORAL_TABLET | Freq: Two times a day (BID) | ORAL | 2 refills | Status: DC
  Filled 2023-08-21: qty 60, 30d supply, fill #0
  Filled 2023-10-09: qty 60, 30d supply, fill #1

## 2023-09-03 DIAGNOSIS — Z419 Encounter for procedure for purposes other than remedying health state, unspecified: Secondary | ICD-10-CM | POA: Diagnosis not present

## 2023-09-23 DIAGNOSIS — Z419 Encounter for procedure for purposes other than remedying health state, unspecified: Secondary | ICD-10-CM | POA: Diagnosis not present

## 2023-10-04 DIAGNOSIS — Z419 Encounter for procedure for purposes other than remedying health state, unspecified: Secondary | ICD-10-CM | POA: Diagnosis not present

## 2023-10-09 ENCOUNTER — Other Ambulatory Visit (HOSPITAL_BASED_OUTPATIENT_CLINIC_OR_DEPARTMENT_OTHER): Payer: Self-pay

## 2023-10-09 ENCOUNTER — Other Ambulatory Visit (HOSPITAL_BASED_OUTPATIENT_CLINIC_OR_DEPARTMENT_OTHER): Payer: Self-pay | Admitting: Cardiovascular Disease

## 2023-10-09 ENCOUNTER — Other Ambulatory Visit: Payer: Self-pay

## 2023-10-09 DIAGNOSIS — I1A0 Resistant hypertension: Secondary | ICD-10-CM

## 2023-10-09 MED ORDER — VALSARTAN 160 MG PO TABS
160.0000 mg | ORAL_TABLET | Freq: Every day | ORAL | 2 refills | Status: DC
  Filled 2023-10-09: qty 90, 90d supply, fill #0
  Filled 2024-06-06: qty 60, 60d supply, fill #1

## 2023-10-21 DIAGNOSIS — Z419 Encounter for procedure for purposes other than remedying health state, unspecified: Secondary | ICD-10-CM | POA: Diagnosis not present

## 2023-10-25 DIAGNOSIS — N182 Chronic kidney disease, stage 2 (mild): Secondary | ICD-10-CM | POA: Diagnosis not present

## 2023-10-25 DIAGNOSIS — Z8249 Family history of ischemic heart disease and other diseases of the circulatory system: Secondary | ICD-10-CM | POA: Diagnosis not present

## 2023-10-25 DIAGNOSIS — I129 Hypertensive chronic kidney disease with stage 1 through stage 4 chronic kidney disease, or unspecified chronic kidney disease: Secondary | ICD-10-CM | POA: Diagnosis not present

## 2023-10-25 DIAGNOSIS — Z833 Family history of diabetes mellitus: Secondary | ICD-10-CM | POA: Diagnosis not present

## 2023-10-25 DIAGNOSIS — I429 Cardiomyopathy, unspecified: Secondary | ICD-10-CM | POA: Diagnosis not present

## 2023-10-25 DIAGNOSIS — Z8673 Personal history of transient ischemic attack (TIA), and cerebral infarction without residual deficits: Secondary | ICD-10-CM | POA: Diagnosis not present

## 2023-10-26 ENCOUNTER — Encounter (HOSPITAL_BASED_OUTPATIENT_CLINIC_OR_DEPARTMENT_OTHER): Payer: Medicaid Other | Admitting: Family

## 2023-11-01 DIAGNOSIS — Z419 Encounter for procedure for purposes other than remedying health state, unspecified: Secondary | ICD-10-CM | POA: Diagnosis not present

## 2023-11-16 ENCOUNTER — Encounter (HOSPITAL_BASED_OUTPATIENT_CLINIC_OR_DEPARTMENT_OTHER): Admitting: Family

## 2023-11-17 ENCOUNTER — Other Ambulatory Visit (HOSPITAL_BASED_OUTPATIENT_CLINIC_OR_DEPARTMENT_OTHER): Payer: Self-pay

## 2023-11-17 ENCOUNTER — Ambulatory Visit (INDEPENDENT_AMBULATORY_CARE_PROVIDER_SITE_OTHER): Admitting: Student

## 2023-11-17 ENCOUNTER — Encounter: Payer: Self-pay | Admitting: Student

## 2023-11-17 VITALS — BP 131/82 | HR 67 | Ht 75.0 in | Wt 226.4 lb

## 2023-11-17 DIAGNOSIS — I1A0 Resistant hypertension: Secondary | ICD-10-CM | POA: Diagnosis not present

## 2023-11-17 DIAGNOSIS — I639 Cerebral infarction, unspecified: Secondary | ICD-10-CM

## 2023-11-17 MED ORDER — CARVEDILOL 25 MG PO TABS
25.0000 mg | ORAL_TABLET | Freq: Two times a day (BID) | ORAL | 2 refills | Status: DC
  Filled 2023-11-17: qty 60, 30d supply, fill #0
  Filled 2023-12-21: qty 60, 30d supply, fill #1
  Filled 2024-01-27: qty 60, 30d supply, fill #2

## 2023-11-17 NOTE — Patient Instructions (Signed)
 It was great to see you today! Thank you for choosing [facility] for your primary care.  Today we addressed: Follow up with the blood pressure clinic  I have refilled the medication and made a referral for voice lab  If you haven't already, sign up for My Chart to have easy access to your labs results, and communication with your primary care physician.   Please arrive 15 minutes before your appointment to ensure smooth check in process.  We appreciate your efforts in making this happen.  Thank you for allowing me to participate in your care, Alfredo Martinez, MD 11/17/2023, 2:53 PM PGY-3, [facility]

## 2023-11-17 NOTE — Assessment & Plan Note (Signed)
 Follow up with HTN clinic, continue current regimen. BP 119/80 at home on average per cuff. Coreg rx refill given.
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
## 2023-11-21 ENCOUNTER — Other Ambulatory Visit (HOSPITAL_BASED_OUTPATIENT_CLINIC_OR_DEPARTMENT_OTHER): Payer: Self-pay

## 2023-12-02 DIAGNOSIS — Z419 Encounter for procedure for purposes other than remedying health state, unspecified: Secondary | ICD-10-CM | POA: Diagnosis not present

## 2023-12-07 ENCOUNTER — Other Ambulatory Visit (HOSPITAL_BASED_OUTPATIENT_CLINIC_OR_DEPARTMENT_OTHER): Payer: Self-pay

## 2023-12-07 ENCOUNTER — Ambulatory Visit (INDEPENDENT_AMBULATORY_CARE_PROVIDER_SITE_OTHER): Admitting: Family

## 2023-12-07 ENCOUNTER — Encounter (HOSPITAL_BASED_OUTPATIENT_CLINIC_OR_DEPARTMENT_OTHER): Payer: Self-pay | Admitting: Family

## 2023-12-07 VITALS — BP 130/84 | HR 62 | Ht 75.0 in | Wt 227.8 lb

## 2023-12-07 DIAGNOSIS — E7800 Pure hypercholesterolemia, unspecified: Secondary | ICD-10-CM | POA: Diagnosis not present

## 2023-12-07 DIAGNOSIS — E782 Mixed hyperlipidemia: Secondary | ICD-10-CM | POA: Diagnosis not present

## 2023-12-07 DIAGNOSIS — I1A0 Resistant hypertension: Secondary | ICD-10-CM
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
## 2023-12-07 NOTE — Patient Instructions (Signed)
 Medication Instructions:  Your physician recommends that you continue on your current medications as directed. Please refer to the Current Medication list given to you today.   Labwork: LABS TODAY   Follow-Up: Please follow up in 6 months in [facility] HTN [facility]IN[facility] with Dr. Theodis Fiscal, Neomi Banks, NP or Donivan Furry PharmD

## 2023-12-08 ENCOUNTER — Other Ambulatory Visit (HOSPITAL_BASED_OUTPATIENT_CLINIC_OR_DEPARTMENT_OTHER): Payer: Self-pay

## 2023-12-08 ENCOUNTER — Encounter (HOSPITAL_BASED_OUTPATIENT_CLINIC_OR_DEPARTMENT_OTHER): Payer: Self-pay

## 2023-12-08 DIAGNOSIS — E782 Mixed hyperlipidemia: Secondary | ICD-10-CM

## 2023-12-08 LAB — LIPID PANEL
Chol/HDL Ratio: 4 ratio (ref 0.0–5.0)
Cholesterol, Total: 162 mg/dL (ref 100–199)
HDL: 41 mg/dL (ref >39)
LDL Chol Calc (NIH): 107 mg/dL — ABNORMAL HIGH (ref 0–99)
Triglycerides: 73 mg/dL (ref 0–149)
VLDL Cholesterol Cal: 14 mg/dL (ref 5–40)

## 2023-12-08 LAB — HEPATIC FUNCTION PANEL
ALT: 18 IU/L (ref 0–44)
AST: 19 IU/L (ref 0–40)
Albumin: 4.5 g/dL (ref 4.1–5.1)
Alkaline Phosphatase: 81 IU/L (ref 44–121)
Bilirubin Total: 0.6 mg/dL (ref 0.0–1.2)
Bilirubin, Direct: 0.2 mg/dL (ref 0.00–0.40)
Total Protein: 6.9 g/dL (ref 6.0–8.5)

## 2023-12-09 ENCOUNTER — Other Ambulatory Visit (HOSPITAL_BASED_OUTPATIENT_CLINIC_OR_DEPARTMENT_OTHER): Payer: Self-pay

## 2023-12-21 ENCOUNTER — Other Ambulatory Visit (HOSPITAL_BASED_OUTPATIENT_CLINIC_OR_DEPARTMENT_OTHER): Payer: Self-pay

## 2023-12-25 ENCOUNTER — Other Ambulatory Visit (HOSPITAL_BASED_OUTPATIENT_CLINIC_OR_DEPARTMENT_OTHER): Payer: Self-pay

## 2024-01-01 DIAGNOSIS — Z419 Encounter for procedure for purposes other than remedying health state, unspecified: Secondary | ICD-10-CM | POA: Diagnosis not present

## 2024-01-12 ENCOUNTER — Other Ambulatory Visit: Payer: Self-pay | Admitting: Student

## 2024-01-12 ENCOUNTER — Encounter: Payer: Self-pay | Admitting: Speech Pathology

## 2024-01-12 ENCOUNTER — Ambulatory Visit: Attending: Family Medicine | Admitting: Speech Pathology

## 2024-01-12 DIAGNOSIS — I619 Nontraumatic intracerebral hemorrhage, unspecified: Secondary | ICD-10-CM

## 2024-01-12 DIAGNOSIS — I639 Cerebral infarction, unspecified: Secondary | ICD-10-CM | POA: Diagnosis not present

## 2024-01-12 DIAGNOSIS — R471 Dysarthria and anarthria: Secondary | ICD-10-CM | POA: Insufficient documentation

## 2024-01-12 DIAGNOSIS — R490 Dysphonia: Secondary | ICD-10-CM | POA: Diagnosis not present
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
## 2024-01-17 ENCOUNTER — Other Ambulatory Visit: Payer: Self-pay | Admitting: Student

## 2024-01-17 DIAGNOSIS — I639 Cerebral infarction, unspecified: Secondary | ICD-10-CM

## 2024-01-17 NOTE — Progress Notes (Signed)
 Placing new speech therapy referral

## 2024-01-30 ENCOUNTER — Encounter: Payer: Self-pay | Admitting: *Deleted

## 2024-01-30 ENCOUNTER — Encounter: Payer: Self-pay | Admitting: Speech Pathology

## 2024-02-01 DIAGNOSIS — Z419 Encounter for procedure for purposes other than remedying health state, unspecified: Secondary | ICD-10-CM | POA: Diagnosis not present

## 2024-02-08 ENCOUNTER — Encounter: Admitting: Speech Pathology

## 2024-02-14 ENCOUNTER — Encounter: Admitting: Speech Pathology

## 2024-02-19 ENCOUNTER — Encounter: Admitting: Speech Pathology

## 2024-02-21 ENCOUNTER — Other Ambulatory Visit (HOSPITAL_BASED_OUTPATIENT_CLINIC_OR_DEPARTMENT_OTHER)

## 2024-02-21 DIAGNOSIS — I517 Cardiomegaly: Secondary | ICD-10-CM

## 2024-02-21 LAB — ECHOCARDIOGRAM COMPLETE
AV Mean grad: 5 mmHg
AV Peak grad: 10.6 mmHg
AV Vena cont: 0.49 cm
Ao pk vel: 1.63 m/s
Area-P 1/2: 3.21 cm2
P 1/2 time: 693 ms
S' Lateral: 1.78 cm

## 2024-02-23 ENCOUNTER — Other Ambulatory Visit: Payer: Self-pay

## 2024-02-28 ENCOUNTER — Encounter: Admitting: Speech Pathology

## 2024-03-02 DIAGNOSIS — Z419 Encounter for procedure for purposes other than remedying health state, unspecified: Secondary | ICD-10-CM | POA: Diagnosis not present

## 2024-03-05 ENCOUNTER — Ambulatory Visit: Payer: Self-pay | Admitting: Cardiovascular Disease

## 2024-03-12 ENCOUNTER — Encounter: Payer: Self-pay | Admitting: Family Medicine

## 2024-03-12 ENCOUNTER — Other Ambulatory Visit (HOSPITAL_BASED_OUTPATIENT_CLINIC_OR_DEPARTMENT_OTHER): Payer: Self-pay

## 2024-03-12 MED ORDER — CARVEDILOL 25 MG PO TABS
25.0000 mg | ORAL_TABLET | Freq: Two times a day (BID) | ORAL | 2 refills | Status: DC
  Filled 2024-03-12: qty 60, 30d supply, fill #0
  Filled 2024-05-26: qty 60, 30d supply, fill #1

## 2024-03-21 DIAGNOSIS — R490 Dysphonia: Secondary | ICD-10-CM | POA: Diagnosis not present

## 2024-03-21 DIAGNOSIS — J385 Laryngeal spasm: Secondary | ICD-10-CM | POA: Diagnosis not present

## 2024-03-21 DIAGNOSIS — R471 Dysarthria and anarthria: Secondary | ICD-10-CM | POA: Diagnosis not present

## 2024-03-21 DIAGNOSIS — K1379 Other lesions of oral mucosa: Secondary | ICD-10-CM | POA: Diagnosis not present

## 2024-03-21 DIAGNOSIS — J383 Other diseases of vocal cords: Secondary | ICD-10-CM | POA: Diagnosis not present

## 2024-04-19 ENCOUNTER — Other Ambulatory Visit (HOSPITAL_BASED_OUTPATIENT_CLINIC_OR_DEPARTMENT_OTHER): Payer: Self-pay

## 2024-05-26 ENCOUNTER — Other Ambulatory Visit (HOSPITAL_BASED_OUTPATIENT_CLINIC_OR_DEPARTMENT_OTHER): Payer: Self-pay | Admitting: Cardiovascular Disease

## 2024-05-27 ENCOUNTER — Other Ambulatory Visit: Payer: Self-pay

## 2024-05-27 ENCOUNTER — Other Ambulatory Visit (HOSPITAL_BASED_OUTPATIENT_CLINIC_OR_DEPARTMENT_OTHER): Payer: Self-pay

## 2024-05-28 ENCOUNTER — Other Ambulatory Visit: Payer: Self-pay

## 2024-05-28 ENCOUNTER — Other Ambulatory Visit (HOSPITAL_BASED_OUTPATIENT_CLINIC_OR_DEPARTMENT_OTHER): Payer: Self-pay

## 2024-05-28 ENCOUNTER — Other Ambulatory Visit (HOSPITAL_COMMUNITY): Payer: Self-pay

## 2024-05-28 MED ORDER — HYDRALAZINE HCL 100 MG PO TABS
100.0000 mg | ORAL_TABLET | Freq: Three times a day (TID) | ORAL | 1 refills | Status: DC
  Filled 2024-05-28 (×3): qty 90, 30d supply, fill #0

## 2024-05-29 ENCOUNTER — Other Ambulatory Visit (HOSPITAL_COMMUNITY): Payer: Self-pay

## 2024-06-06 ENCOUNTER — Other Ambulatory Visit (HOSPITAL_BASED_OUTPATIENT_CLINIC_OR_DEPARTMENT_OTHER): Payer: Self-pay

## 2024-06-06 ENCOUNTER — Encounter (HOSPITAL_BASED_OUTPATIENT_CLINIC_OR_DEPARTMENT_OTHER): Payer: Self-pay | Admitting: Cardiovascular Disease

## 2024-06-06 ENCOUNTER — Ambulatory Visit (HOSPITAL_BASED_OUTPATIENT_CLINIC_OR_DEPARTMENT_OTHER): Admitting: Cardiovascular Disease

## 2024-06-06 VITALS — BP 128/86 | HR 61 | Ht 75.0 in | Wt 230.3 lb

## 2024-06-06 DIAGNOSIS — I1A0 Resistant hypertension: Secondary | ICD-10-CM

## 2024-06-06 DIAGNOSIS — E782 Mixed hyperlipidemia: Secondary | ICD-10-CM

## 2024-06-06 MED ORDER — VALSARTAN 160 MG PO TABS
160.0000 mg | ORAL_TABLET | Freq: Every day | ORAL | 3 refills | Status: AC
  Filled 2024-06-06: qty 90, 90d supply, fill #0

## 2024-06-06 MED ORDER — AMLODIPINE BESYLATE 5 MG PO TABS
5.0000 mg | ORAL_TABLET | Freq: Every day | ORAL | 3 refills | Status: AC
  Filled 2024-06-06: qty 30, 30d supply, fill #0

## 2024-06-06 MED ORDER — HYDRALAZINE HCL 100 MG PO TABS
100.0000 mg | ORAL_TABLET | Freq: Three times a day (TID) | ORAL | 3 refills | Status: AC
  Filled 2024-06-06 – 2024-07-09 (×2): qty 270, 90d supply, fill #0

## 2024-06-06 MED ORDER — CARVEDILOL 25 MG PO TABS
25.0000 mg | ORAL_TABLET | Freq: Two times a day (BID) | ORAL | 3 refills | Status: AC
  Filled 2024-06-06 – 2024-07-09 (×2): qty 180, 90d supply, fill #0

## 2024-06-06 NOTE — Progress Notes (Signed)
ekf
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
## 2024-06-06 NOTE — Patient Instructions (Signed)
 Medication Instructions:  Your physician recommends that you continue on your current medications as directed. Please refer to the Current Medication list given to you today.   Labwork: NONE  Testing/Procedures: NONE  Follow-Up: 10 MONTHS WITH DR Ostrander OR CAITLIN W NP   Any Other Special Instructions Will Be Listed Below (If Applicable).     If you need a refill on your cardiac medications before your next appointment, please call your pharmacy.

## 2024-06-07 ENCOUNTER — Other Ambulatory Visit: Payer: Self-pay

## 2024-06-17 ENCOUNTER — Other Ambulatory Visit (HOSPITAL_BASED_OUTPATIENT_CLINIC_OR_DEPARTMENT_OTHER): Payer: Self-pay

## 2024-06-25 ENCOUNTER — Encounter (HOSPITAL_BASED_OUTPATIENT_CLINIC_OR_DEPARTMENT_OTHER): Payer: Self-pay | Admitting: Cardiovascular Disease

## 2024-06-27 ENCOUNTER — Ambulatory Visit (HOSPITAL_COMMUNITY)

## 2024-07-03 ENCOUNTER — Ambulatory Visit (HOSPITAL_COMMUNITY)
Admission: RE | Admit: 2024-07-03 | Discharge: 2024-07-03 | Disposition: A | Discharge status: Home / Self Care | Source: Ambulatory Visit | Attending: Emergency Medicine | Admitting: Emergency Medicine

## 2024-07-03 ENCOUNTER — Encounter (HOSPITAL_COMMUNITY): Payer: Self-pay

## 2024-07-03 VITALS — BP 159/101 | HR 67 | Temp 97.9°F | Resp 15

## 2024-07-03 DIAGNOSIS — Z113 Encounter for screening for infections with a predominantly sexual mode of transmission: Secondary | ICD-10-CM | POA: Diagnosis present

## 2024-07-03 NOTE — Discharge Instructions (Signed)
 You have been screened for sexually transmitted infections and staff will contact you if any abnormal results are found.  Abstain from intercourse until all results have been received.
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
## 2024-07-03 NOTE — ED Triage Notes (Signed)
 Pt requesting STD screening. Denies signs or symptoms but exposed to chlamydia.

## 2024-07-04 LAB — CYTOLOGY, (ORAL, ANAL, URETHRAL) ANCILLARY ONLY
Chlamydia: NEGATIVE
Comment: NEGATIVE
Comment: NEGATIVE
Comment: NORMAL
Neisseria Gonorrhea: NEGATIVE
Trichomonas: NEGATIVE

## 2024-07-10 ENCOUNTER — Other Ambulatory Visit (HOSPITAL_BASED_OUTPATIENT_CLINIC_OR_DEPARTMENT_OTHER): Payer: Self-pay

## 2024-08-11 ENCOUNTER — Encounter: Payer: Self-pay | Admitting: Family Medicine

## 2024-08-13 ENCOUNTER — Encounter (HOSPITAL_COMMUNITY): Payer: Self-pay

## 2024-08-13 ENCOUNTER — Ambulatory Visit (HOSPITAL_COMMUNITY)
Admission: EM | Admit: 2024-08-13 | Discharge: 2024-08-13 | Disposition: A | Discharge status: Home / Self Care | Attending: Family Medicine | Admitting: Family Medicine

## 2024-08-13 DIAGNOSIS — Z113 Encounter for screening for infections with a predominantly sexual mode of transmission: Secondary | ICD-10-CM | POA: Diagnosis not present

## 2024-08-13 DIAGNOSIS — I10 Essential (primary) hypertension: Secondary | ICD-10-CM | POA: Insufficient documentation

## 2024-08-13 LAB — CYTOLOGY, (ORAL, ANAL, URETHRAL) ANCILLARY ONLY
Chlamydia: NEGATIVE
Comment: NEGATIVE
Comment: NEGATIVE
Comment: NORMAL
Neisseria Gonorrhea: NEGATIVE
Trichomonas: NEGATIVE

## 2024-08-13 LAB — HIV ANTIBODY (ROUTINE TESTING W REFLEX): HIV Screen 4th Generation wRfx: NONREACTIVE

## 2024-08-13 NOTE — ED Triage Notes (Signed)
 Patient here today to be tested for all Stds. Denies symptoms.
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
## 2024-08-13 NOTE — Discharge Instructions (Addendum)
 We have sent testing for sexually transmitted infections. We will notify you of any positive results once they are received. If required, we will prescribe any medications you might need.  Please refrain from all sexual activity for at least the next seven days.  Your blood pressure was noted to be elevated during your visit today. If you are currently taking medication for high blood pressure, please ensure you are taking this as directed. If you do not have a history of high blood pressure and your blood pressure remains persistently elevated, you may need to begin taking a medication at some point. You may return here within the next few days to recheck if unable to see your primary care provider or if you do not have a one.  BP (!) 158/100 (BP Location: Left Arm)   Pulse 60   Temp 98.4 F (36.9 C) (Oral)   Resp 16   SpO2 97%   BP Readings from Last 3 Encounters:  08/13/24 (!) 158/100  07/03/24 (!) 159/101  06/06/24 128/86

## 2024-08-14 LAB — SYPHILIS: RPR W/REFLEX TO RPR TITER AND TREPONEMAL ANTIBODIES, TRADITIONAL SCREENING AND DIAGNOSIS ALGORITHM: RPR Ser Ql: NONREACTIVE
